# Patient Record
Sex: Male | Born: 1940
Health system: Southern US, Community
[De-identification: ages and names within clinical notes are randomized; demographics above are authoritative.]

## PROBLEM LIST (undated history)

## (undated) DIAGNOSIS — C4491 Basal cell carcinoma of skin, unspecified: Secondary | ICD-10-CM

## (undated) DIAGNOSIS — C61 Malignant neoplasm of prostate: Secondary | ICD-10-CM

## (undated) DIAGNOSIS — F40240 Claustrophobia: Secondary | ICD-10-CM

## (undated) DIAGNOSIS — F411 Generalized anxiety disorder: Secondary | ICD-10-CM

## (undated) DIAGNOSIS — D126 Benign neoplasm of colon, unspecified: Secondary | ICD-10-CM

## (undated) DIAGNOSIS — F419 Anxiety disorder, unspecified: Secondary | ICD-10-CM

## (undated) DIAGNOSIS — F514 Sleep terrors [night terrors]: Secondary | ICD-10-CM

## (undated) DIAGNOSIS — G4752 REM sleep behavior disorder: Secondary | ICD-10-CM

## (undated) DIAGNOSIS — F513 Sleepwalking [somnambulism]: Secondary | ICD-10-CM

## (undated) DIAGNOSIS — Z87828 Personal history of other (healed) physical injury and trauma: Secondary | ICD-10-CM

## (undated) DIAGNOSIS — E7800 Pure hypercholesterolemia, unspecified: Secondary | ICD-10-CM

## (undated) DIAGNOSIS — R413 Other amnesia: Secondary | ICD-10-CM

## (undated) HISTORY — DX: REM sleep behavior disorder: G47.52

## (undated) HISTORY — DX: Basal cell carcinoma of skin, unspecified: C44.91

## (undated) HISTORY — DX: Sleepwalking (somnambulism): F51.3

## (undated) HISTORY — PX: RHINOPLASTY: SUR1284

## (undated) HISTORY — DX: Sleep terrors (night terrors): F51.4

## (undated) HISTORY — PX: MOHS SURGERY: SHX181

## (undated) HISTORY — DX: Generalized anxiety disorder: F41.1

## (undated) HISTORY — DX: Benign neoplasm of colon, unspecified: D12.6

## (undated) HISTORY — PX: DENTAL SURGERY: SHX609

## (undated) HISTORY — PX: KNEE ARTHROSCOPY WITH MENISCAL REPAIR: SHX5653

## (undated) HISTORY — PX: APPENDECTOMY: SHX54

## (undated) HISTORY — DX: Personal history of other (healed) physical injury and trauma: Z87.828

## (undated) HISTORY — DX: Other amnesia: R41.3

## (undated) HISTORY — PX: TONSILLECTOMY AND ADENOIDECTOMY: SHX28

---

## 2008-01-17 ENCOUNTER — Encounter: Admission: RE | Admit: 2008-01-17 | Discharge: 2008-01-17 | Payer: Self-pay | Admitting: Family Medicine

## 2008-02-01 ENCOUNTER — Encounter: Admission: RE | Admit: 2008-02-01 | Discharge: 2008-02-01 | Payer: Self-pay | Admitting: Family Medicine

## 2009-07-08 DIAGNOSIS — F40240 Claustrophobia: Secondary | ICD-10-CM | POA: Insufficient documentation

## 2010-08-01 DIAGNOSIS — M179 Osteoarthritis of knee, unspecified: Secondary | ICD-10-CM

## 2010-08-01 DIAGNOSIS — M1710 Unilateral primary osteoarthritis, unspecified knee: Secondary | ICD-10-CM | POA: Insufficient documentation

## 2010-08-01 HISTORY — DX: Osteoarthritis of knee, unspecified: M17.9

## 2010-08-01 HISTORY — DX: Unilateral primary osteoarthritis, unspecified knee: M17.10

## 2011-08-27 DIAGNOSIS — J309 Allergic rhinitis, unspecified: Secondary | ICD-10-CM

## 2011-08-27 HISTORY — DX: Allergic rhinitis, unspecified: J30.9

## 2011-10-01 DIAGNOSIS — N400 Enlarged prostate without lower urinary tract symptoms: Secondary | ICD-10-CM

## 2011-10-01 DIAGNOSIS — N529 Male erectile dysfunction, unspecified: Secondary | ICD-10-CM | POA: Insufficient documentation

## 2011-10-01 HISTORY — DX: Male erectile dysfunction, unspecified: N52.9

## 2011-10-01 HISTORY — DX: Benign prostatic hyperplasia without lower urinary tract symptoms: N40.0

## 2011-11-12 DIAGNOSIS — N281 Cyst of kidney, acquired: Secondary | ICD-10-CM

## 2011-11-12 HISTORY — DX: Cyst of kidney, acquired: N28.1

## 2014-09-27 DIAGNOSIS — E782 Mixed hyperlipidemia: Secondary | ICD-10-CM | POA: Insufficient documentation

## 2014-09-27 HISTORY — DX: Mixed hyperlipidemia: E78.2

## 2014-12-06 ENCOUNTER — Telehealth: Payer: Self-pay

## 2014-12-06 NOTE — Telephone Encounter (Signed)
Rec'd records from Francoise Ceo MD.,Forwarding 11 page's to [facility]

## 2014-12-11 ENCOUNTER — Telehealth (HOSPITAL_COMMUNITY): Payer: Self-pay | Admitting: *Deleted

## 2014-12-11 NOTE — Telephone Encounter (Signed)
Left message on voicemail in reference to upcoming appointment scheduled for 12/13/14. Phone number given for a call back so details instructions can be given. Caleb Gibson W    

## 2014-12-11 NOTE — Telephone Encounter (Signed)
Patient given detailed instructions per Stress Test Requisition Sheet for test on 12/13/14 at 1500.Patient Notified to arrive 30 minutes early, and that it is imperative to arrive on time for appointment to keep from having the test rescheduled.  Patient verbalized understanding. Caleb Gibson, [name]

## 2014-12-12 ENCOUNTER — Telehealth: Payer: Self-pay

## 2014-12-12 NOTE — Telephone Encounter (Signed)
SPOKE WITH REBECCA (PRECERT/REFERALS) CASE IS PENDING FOR ECHO-[facility]- REBECCA STATED THAT IF SHE HAS NOT GOTTEN AN APPROVAL CODE BY 2:00 TODAY SHE WILL HAVE TO [place]. PATIENT . I WILL FOLLOW UP WITH HER AT 2:00 TODAY

## 2014-12-13 ENCOUNTER — Other Ambulatory Visit (HOSPITAL_COMMUNITY): Payer: Self-pay

## 2014-12-17 ENCOUNTER — Telehealth: Payer: Self-pay

## 2014-12-17 NOTE — Telephone Encounter (Signed)
Echo appt cancelled 12/13/2014 daj

## 2014-12-27 ENCOUNTER — Telehealth (HOSPITAL_COMMUNITY): Payer: Self-pay

## 2014-12-27 NOTE — Telephone Encounter (Signed)
Patient given detailed instructions per Stress Test Requisition Sheet for test on 01-01-2015 at 1500.Patient Notified to arrive 30 minutes early, and that it is imperative to arrive on time for appointment to keep from having the test rescheduled.  Patient verbalized understanding. Caleb Gibson, [name]

## 2014-12-31 ENCOUNTER — Telehealth: Payer: Self-pay

## 2014-12-31 NOTE — Telephone Encounter (Signed)
Left message for Caleb Gibson [phone]  to see if auth# was obtained for patient for upcoming echo on 01/01/2015

## 2015-01-01 ENCOUNTER — Ambulatory Visit (HOSPITAL_COMMUNITY): Payer: Medicare HMO

## 2015-01-01 ENCOUNTER — Other Ambulatory Visit (HOSPITAL_COMMUNITY): Payer: Self-pay | Admitting: Family Medicine

## 2015-01-01 ENCOUNTER — Ambulatory Visit (HOSPITAL_COMMUNITY): Payer: Medicare HMO | Attending: Cardiovascular Disease

## 2015-01-01 DIAGNOSIS — R5383 Other fatigue: Secondary | ICD-10-CM | POA: Insufficient documentation

## 2015-01-01 DIAGNOSIS — I4510 Unspecified right bundle-branch block: Secondary | ICD-10-CM | POA: Insufficient documentation

## 2015-01-01 DIAGNOSIS — E785 Hyperlipidemia, unspecified: Secondary | ICD-10-CM | POA: Diagnosis not present

## 2015-02-18 ENCOUNTER — Ambulatory Visit (INDEPENDENT_AMBULATORY_CARE_PROVIDER_SITE_OTHER): Payer: Medicare HMO | Admitting: Licensed Clinical Social Worker

## 2015-02-18 DIAGNOSIS — F40248 Other situational type phobia: Secondary | ICD-10-CM | POA: Diagnosis not present

## 2015-03-11 ENCOUNTER — Ambulatory Visit (INDEPENDENT_AMBULATORY_CARE_PROVIDER_SITE_OTHER): Payer: Medicare HMO | Admitting: Licensed Clinical Social Worker

## 2015-03-11 DIAGNOSIS — F40248 Other situational type phobia: Secondary | ICD-10-CM | POA: Diagnosis not present

## 2015-04-01 ENCOUNTER — Ambulatory Visit: Payer: Medicare HMO | Admitting: Licensed Clinical Social Worker

## 2015-05-29 ENCOUNTER — Other Ambulatory Visit: Payer: Self-pay | Admitting: Orthopedic Surgery

## 2015-05-29 DIAGNOSIS — M25512 Pain in left shoulder: Secondary | ICD-10-CM

## 2015-06-08 ENCOUNTER — Ambulatory Visit
Admission: RE | Admit: 2015-06-08 | Discharge: 2015-06-08 | Disposition: A | Discharge status: Home / Self Care | Payer: Medicare HMO | Source: Ambulatory Visit | Attending: Orthopedic Surgery | Admitting: Orthopedic Surgery

## 2015-06-08 DIAGNOSIS — M25512 Pain in left shoulder: Secondary | ICD-10-CM

## 2015-10-08 DIAGNOSIS — Z79899 Other long term (current) drug therapy: Secondary | ICD-10-CM | POA: Insufficient documentation

## 2016-01-07 DIAGNOSIS — I950 Idiopathic hypotension: Secondary | ICD-10-CM

## 2016-01-07 HISTORY — DX: Idiopathic hypotension: I95.0

## 2016-03-27 ENCOUNTER — Encounter (HOSPITAL_COMMUNITY): Payer: Self-pay | Admitting: *Deleted

## 2016-03-27 ENCOUNTER — Emergency Department (HOSPITAL_COMMUNITY): Payer: Medicare HMO

## 2016-03-27 ENCOUNTER — Emergency Department (HOSPITAL_COMMUNITY)
Admission: EM | Admit: 2016-03-27 | Discharge: 2016-03-27 | Disposition: A | Discharge status: Home / Self Care | Payer: Medicare HMO | Attending: Emergency Medicine | Admitting: Emergency Medicine

## 2016-03-27 DIAGNOSIS — R079 Chest pain, unspecified: Secondary | ICD-10-CM | POA: Diagnosis not present

## 2016-03-27 HISTORY — DX: Claustrophobia: F40.240

## 2016-03-27 HISTORY — DX: Pure hypercholesterolemia, unspecified: E78.00

## 2016-03-27 HISTORY — DX: Anxiety disorder, unspecified: F41.9

## 2016-03-27 LAB — BASIC METABOLIC PANEL
Anion gap: 6 (ref 5–15)
BUN: 14 mg/dL (ref 6–20)
CALCIUM: 9.8 mg/dL (ref 8.9–10.3)
CHLORIDE: 106 mmol/L (ref 101–111)
CO2: 26 mmol/L (ref 22–32)
CREATININE: 0.91 mg/dL (ref 0.61–1.24)
GFR calc Af Amer: >60 mL/min (ref >60)
GFR calc non Af Amer: >60 mL/min (ref >60)
GLUCOSE: 99 mg/dL (ref 65–99)
Potassium: 4.7 mmol/L (ref 3.5–5.1)
Sodium: 138 mmol/L (ref 135–145)

## 2016-03-27 LAB — CBC
HCT: 40.9 % (ref 39.0–52.0)
Hemoglobin: 14.1 g/dL (ref 13.0–17.0)
MCH: 31.6 pg (ref 26.0–34.0)
MCHC: 34.5 g/dL (ref 30.0–36.0)
MCV: 91.7 fL (ref 78.0–100.0)
PLATELETS: 186 10*3/uL (ref 150–400)
RBC: 4.46 MIL/uL (ref 4.22–5.81)
RDW: 12.7 % (ref 11.5–15.5)
WBC: 4.4 10*3/uL (ref 4.0–10.5)

## 2016-03-27 LAB — I-STAT TROPONIN, ED: Troponin i, poc: 0 ng/mL (ref 0.00–0.08)

## 2016-03-27 NOTE — ED Provider Notes (Signed)
[facility] DEPT Provider Note   CSN: [id number] Arrival date & time: 03/27/16  1251     History   Chief Complaint Chief Complaint  Patient presents with  . Chest Pain    HPI Dickson Barfield is a 75 y.o. male.  HPI Patient presents with sharp right sided chest pain. It occurred 3 times today. The longest episode lasted 2 seconds. No shortness of breath. No lightheadedness or dizziness. No fevers. No cough. He has not had pains like this before. No swelling in his legs. No change in his exercise tolerance. negatvie stress test in the last 2 years.no abdominal pain.    Past Medical History:  Diagnosis Date  . Anxiety   . Claustrophobia   . High cholesterol     There are no active problems to display for this patient.   History reviewed. No pertinent surgical history.     Home Medications    Prior to Admission medications   Not on File    Family History History reviewed. No pertinent family history.  Social History Social History  Substance Use Topics  . Smoking status: Never Smoker  . Smokeless tobacco: Not on file  . Alcohol use Yes     Comment: occ     Allergies   Review of patient's allergies indicates no known allergies.   Review of Systems Review of Systems  Constitutional: Negative for appetite change, chills and fever.  HENT: Negative for congestion.   Respiratory: Negative for chest tightness.   Cardiovascular: Positive for chest pain.  Gastrointestinal: Negative for abdominal pain.  Genitourinary: Negative for difficulty urinating and flank pain.  Musculoskeletal: Negative for back pain.  Skin: Negative for rash.  Neurological: Negative for headaches.  Psychiatric/Behavioral: Negative for agitation.     Physical Exam Updated Vital Signs BP 114/63 (BP Location: Right Arm)   Pulse 65   Temp 98.7 F (37.1 C) (Oral)   Resp 14   Wt 217 lb (98.4 kg)   SpO2 98%   Physical Exam  Constitutional: He appears well-developed.  HENT:    Head: Atraumatic.  Eyes: EOM are normal.  Neck: Neck supple.  Cardiovascular: Normal rate.   Pulmonary/Chest: Effort normal.  Abdominal: Soft. There is no tenderness.  Musculoskeletal: Normal range of motion.  Neurological: He is alert.  Skin: Skin is warm. Capillary refill takes less than 2 seconds.  Psychiatric: He has a normal mood and affect.     ED Treatments / Results  Labs (all labs ordered are listed, but only abnormal results are displayed) Labs Reviewed  [place], ED    EKG  EKG Interpretation  Date/Time:  Friday March 27 2016 12:59:15 EDT Ventricular Rate:  66 PR Interval:  188 QRS Duration: 108 QT Interval:  408 QTC Calculation: 427 R Axis:   50 Text Interpretation:  Normal sinus rhythm Normal ECG Confirmed by Alvino Chapel  MD, Mahamed Zalewski [phone]) on 03/27/2016 3:42:41 PM       Radiology Dg Chest 2 View  Result Date: 03/27/2016 CLINICAL DATA:  Chest pain. EXAM: CHEST  2 VIEW COMPARISON:  No recent prior. FINDINGS: Mediastinum and hilar structures are normal. Lungs are clear. No pleural effusion or pneumothorax. Degenerative changes thoracic spine. IMPRESSION: No acute cardiopulmonary disease. Electronically Signed   By: Marcello Moores  Register   On: 03/27/2016 14:30    Procedures Procedures (including critical care time)  Medications Ordered in ED Medications - No data to display   Initial Impression / Assessment and Plan /  ED Course  I have reviewed the triage vital signs and the nursing notes.  Pertinent labs & imaging results that were available during my care of the patient were reviewed by me and considered in my medical decision making (see chart for details).  Clinical Course    Patient with chest pain. Sharp and lasted 1 second. No rash. Reassuring story and exam. Low risk overall. D/c.  Final Clinical Impressions(s) / ED Diagnoses   Final diagnoses:  Nonspecific chest pain    New Prescriptions New  Prescriptions   No medications on file     Davonna Belling, MD 03/27/16 1612

## 2016-03-27 NOTE — ED Triage Notes (Signed)
Pt reports intermittent "shocking" right side chest pains today. Denies sob. ekg done. No acute resp distress noted at triage.

## 2016-09-01 LAB — HM COLONOSCOPY

## 2017-05-20 ENCOUNTER — Encounter: Payer: Self-pay | Admitting: Family Medicine

## 2017-06-14 ENCOUNTER — Encounter: Payer: Self-pay | Admitting: *Deleted

## 2017-06-15 ENCOUNTER — Ambulatory Visit (INDEPENDENT_AMBULATORY_CARE_PROVIDER_SITE_OTHER): Payer: Medicare HMO | Admitting: Family Medicine

## 2017-06-15 ENCOUNTER — Encounter: Payer: Self-pay | Admitting: Family Medicine

## 2017-06-15 ENCOUNTER — Other Ambulatory Visit: Payer: Self-pay | Admitting: *Deleted

## 2017-06-15 VITALS — BP 110/70 | HR 68 | Temp 98.4°F | Ht 69.0 in | Wt 229.5 lb

## 2017-06-15 DIAGNOSIS — R1012 Left upper quadrant pain: Secondary | ICD-10-CM | POA: Diagnosis not present

## 2017-06-15 DIAGNOSIS — Z79899 Other long term (current) drug therapy: Secondary | ICD-10-CM

## 2017-06-15 DIAGNOSIS — D126 Benign neoplasm of colon, unspecified: Secondary | ICD-10-CM

## 2017-06-15 DIAGNOSIS — N529 Male erectile dysfunction, unspecified: Secondary | ICD-10-CM

## 2017-06-15 DIAGNOSIS — N400 Enlarged prostate without lower urinary tract symptoms: Secondary | ICD-10-CM | POA: Diagnosis not present

## 2017-06-15 DIAGNOSIS — I950 Idiopathic hypotension: Secondary | ICD-10-CM

## 2017-06-15 DIAGNOSIS — G8929 Other chronic pain: Secondary | ICD-10-CM | POA: Diagnosis not present

## 2017-06-15 DIAGNOSIS — E782 Mixed hyperlipidemia: Secondary | ICD-10-CM | POA: Diagnosis not present

## 2017-06-15 DIAGNOSIS — C4491 Basal cell carcinoma of skin, unspecified: Secondary | ICD-10-CM | POA: Insufficient documentation

## 2017-06-15 DIAGNOSIS — Z0000 Encounter for general adult medical examination without abnormal findings: Secondary | ICD-10-CM

## 2017-06-15 DIAGNOSIS — F40240 Claustrophobia: Secondary | ICD-10-CM

## 2017-06-15 HISTORY — DX: Benign neoplasm of colon, unspecified: D12.6

## 2017-06-15 LAB — CBC WITH DIFFERENTIAL/PLATELET
BASOS ABS: 0 10*3/uL (ref 0.0–0.1)
Basophils Relative: 0.9 % (ref 0.0–3.0)
EOS ABS: 0.1 10*3/uL (ref 0.0–0.7)
EOS PCT: 2.1 % (ref 0.0–5.0)
HCT: 42.6 % (ref 39.0–52.0)
Hemoglobin: 14.4 g/dL (ref 13.0–17.0)
Lymphocytes Relative: 19.8 % (ref 12.0–46.0)
Lymphs Abs: 0.8 10*3/uL (ref 0.7–4.0)
MCHC: 33.8 g/dL (ref 30.0–36.0)
MCV: 95.4 fl (ref 78.0–100.0)
MONO ABS: 0.4 10*3/uL (ref 0.1–1.0)
Monocytes Relative: 9.8 % (ref 3.0–12.0)
NEUTROS PCT: 67.4 % (ref 43.0–77.0)
Neutro Abs: 2.7 10*3/uL (ref 1.4–7.7)
Platelets: 214 10*3/uL (ref 150.0–400.0)
RBC: 4.46 Mil/uL (ref 4.22–5.81)
RDW: 13.2 % (ref 11.5–15.5)
WBC: 4 10*3/uL (ref 4.0–10.5)

## 2017-06-15 LAB — LIPASE: Lipase: 24 U/L (ref 11.0–59.0)

## 2017-06-15 LAB — PSA, MEDICARE: PSA: 5.54 ng/mL — AB (ref 0.10–4.00)

## 2017-06-15 LAB — COMPREHENSIVE METABOLIC PANEL
ALBUMIN: 4.7 g/dL (ref 3.5–5.2)
ALT: 14 U/L (ref 0–53)
AST: 13 U/L (ref 0–37)
Alkaline Phosphatase: 57 U/L (ref 39–117)
BUN: 19 mg/dL (ref 6–23)
CO2: 27 mEq/L (ref 19–32)
CREATININE: 0.89 mg/dL (ref 0.40–1.50)
Calcium: 9.2 mg/dL (ref 8.4–10.5)
Chloride: 104 mEq/L (ref 96–112)
GFR: 88.06 mL/min (ref >60.00)
GLUCOSE: 101 mg/dL — AB (ref 70–99)
POTASSIUM: 3.9 meq/L (ref 3.5–5.1)
SODIUM: 138 meq/L (ref 135–145)
TOTAL PROTEIN: 6.5 g/dL (ref 6.0–8.3)
Total Bilirubin: 0.8 mg/dL (ref 0.2–1.2)

## 2017-06-15 LAB — LIPID PANEL
CHOLESTEROL: 161 mg/dL (ref 0–200)
HDL: 72.7 mg/dL (ref >39.00)
LDL Cholesterol: 74 mg/dL (ref 0–99)
NONHDL: 88.48
Total CHOL/HDL Ratio: 2
Triglycerides: 70 mg/dL (ref 0.0–149.0)
VLDL: 14 mg/dL (ref 0.0–40.0)

## 2017-06-15 LAB — TSH: TSH: 1.82 u[IU]/mL (ref 0.35–4.50)

## 2017-06-15 MED ORDER — CITALOPRAM HYDROBROMIDE 10 MG PO TABS: 10.0000 mg | ORAL_TABLET | Freq: Every day | ORAL | 3 refills | Status: DC

## 2017-06-15 MED ORDER — SILDENAFIL CITRATE 100 MG PO TABS: 100.0000 mg | ORAL_TABLET | Freq: Every day | ORAL | 11 refills | Status: DC | PRN

## 2017-06-15 MED ORDER — LORAZEPAM 0.5 MG PO TABS: 0.5000 mg | ORAL_TABLET | Freq: Every day | ORAL | 0 refills | Status: DC | PRN

## 2017-06-15 NOTE — Progress Notes (Signed)
I have reviewed results. Normal. Patient notified by letter. Please see letter for details. 

## 2017-06-15 NOTE — Patient Instructions (Signed)
It was so good seeing you again! Thank you for establishing with my new practice and allowing me to continue caring for you. It means a lot to me.   Please schedule a follow up appointment with me in 6 months for recheck. Sooner if mood or anxiety check is needed.  Please do these things to maintain good health!   Exercise at least 30-45 minutes a day,  4-5 days a week.   Eat a low-fat diet with lots of fruits and vegetables, up to 7-9 servings per day.  Drink plenty of water daily. Try to drink 8 8oz glasses per day.  Seatbelts can save your life. Always wear your seatbelt.  Place Smoke Detectors on every level of your home and check batteries every year.  Eye Doctor - have an eye exam every 1-2 years  Safe sex - use condoms to protect yourself from STDs if you could be exposed to these types of infections.  Avoid heavy alcohol use. If you drink, keep it to less than 2 drinks/day and not every day.  [place].  Choose someone you trust that could speak for you if you became unable to speak for yourself.  Depression is common in our stressful world.If you're feeling down or losing interest in things you normally enjoy, please come in for a visit.

## 2017-06-15 NOTE — Progress Notes (Signed)
Subjective  Chief Complaint  Patient presents with  . Hyperlipidemia    HPI: Caleb Gibson is a 77 y.o. male who presents to [facility] today for a Male Wellness Visit. He also has the concerns and/or needs as listed above in the chief complaint and other complaints listed below. These will be addressed in addition to the Health Maintenance Visit.   Wellness Visit: annual visit with health maintenance review and exam    Overall, continues to do well overall.  Continues daily walking and healthy diet.  Weight is stable.  Colorectal cancer screening is up-to-date.  Immunizations are up-to-date Lifestyle: Body mass index is 33.89 kg/m. Wt Readings from Last 3 Encounters:  06/15/17 229 lb 8 oz (104.1 kg)  03/27/16 217 lb (98.4 kg)   Diet: low fat Exercise: daily, walking and [facility]  Chronic disease management visit and/or acute problem visit:  Claustrophobia-continues to be symptomatic.  Has been on Celexa for many years but uncertain if this is helping him.  Had been using Xanax 0.5 mg for flying or car travel, however his last plane trip to [place] required 1 mg of Xanax and he had adverse effects including hypersomnolence and abnormal sleep-related behaviors such as reaching out and grabbing the lady in front of him on the plane.  Due to these adverse effects he is inquiring about other options.  He has not been on other benzodiazepines in the past.  Irritability-reports that he has had long-standing issues with occasionally feeling negative or irritable and he typically manages this behaviorally.  He was placed on the Celexa for claustrophobia and for these describes symptoms years ago.  But recently he reports increased appliances and getting upset with his wife.  She tends to be a bit controlling.  He tends to keep his feelings to himself and tries to block them out.  He denies symptoms of depression.  No chronic anxiety.  He likes to avoid  confrontation.  Hyperlipidemia stable on statin therapy.  Due for recheck  BPH without symptoms-due for recheck.  Idiopathic hypotension: Blood pressures have been stable although he will occasionally find a reading 80s over 50s.  He denies symptoms.  Review of systems is negative for palpitations chest pains lightheadedness  Erectile dysfunction-requesting refill of Viagra.  Works well.  No adverse effects.  Patient Active Problem List   Diagnosis Date Noted  . Tubular adenoma of colon 06/15/2017    Priority: High  . On statin therapy due to risk of future cardiovascular event 10/08/2015    Priority: High  . Mixed hyperlipidemia 09/27/2014    Priority: High  . Idiopathic hypotension 01/07/2016    Priority: Medium  . BPH (benign prostatic hyperplasia) 10/01/2011    Priority: Medium  . Erectile dysfunction 10/01/2011    Priority: Medium  . Osteoarthritis, knee 08/01/2010    Priority: Medium  . Claustrophobia 07/08/2009    Priority: Medium  . Basal cell carcinoma of skin 06/15/2017    Priority: Low  . Simple cyst of kidney 11/12/2011    Priority: Low  . Allergic rhinitis 08/27/2011    Priority: Low   Health Maintenance  Topic Date Due  . COLONOSCOPY  09/01/2021  . TETANUS/TDAP  09/30/2021  . INFLUENZA VACCINE  Completed  . PNA vac Low Risk Adult  Completed   Immunization History  Administered Date(s) Administered  . Influenza Split 03/14/2011  . Influenza, High Dose Seasonal PF 02/27/2012, 02/05/2014, 03/10/2015, 04/14/2016  . Influenza, Seasonal, Injecte, Preservative Fre 02/27/2013  .  Influenza-Unspecified 04/15/2017  . Pneumococcal Conjugate-13 02/05/2014  . Pneumococcal-Unspecified 07/08/2009  . Td 11/15/2007  . Tdap 10/01/2011  . Zoster 06/20/2010   We updated and reviewed the patient's past history in detail and it is documented below. Allergies: Patient has No Known Allergies. Past Medical History  has a past medical history of Anxiety, Basal cell  carcinoma of skin, Claustrophobia, High cholesterol, and Tubular adenoma of colon (06/15/2017). Past Surgical History Patient  has a past surgical history that includes Appendectomy; Dental surgery; Rhinoplasty; Tonsillectomy and adenoidectomy; Mohs surgery; and Knee arthroscopy with meniscal repair (Right). Social History Patient  reports that  has never smoked. he has never used smokeless tobacco. He reports that he drinks alcohol. He reports that he does not use drugs. Family History family history includes Alcohol abuse in his maternal aunt and mother; Cancer in his maternal grandfather; Cirrhosis in his maternal aunt; Diabetes in his father; Glaucoma in his father; Heart disease in his maternal grandmother; Melanoma in his mother; Prostate cancer in his father. Review of Systems: Constitutional: negative for fever or malaise Ophthalmic: negative for photophobia, double vision or loss of vision Cardiovascular: negative for chest pain, dyspnea on exertion, or new LE swelling Respiratory: negative for SOB or persistent cough Gastrointestinal: negative for abdominal pain, change in bowel habits or melena Genitourinary: negative for dysuria or gross hematuria Musculoskeletal: negative for new gait disturbance or muscular weakness Integumentary: negative for new or persistent rashes Neurological: negative for TIA or stroke symptoms Psychiatric: negative for SI or delusions Allergic/Immunologic: negative for hives  Patient Care Team    Relationship Specialty Notifications Start End  [name], MD PCP - General Family Medicine  06/15/17    Objective  Vitals: BP 110/70 (BP Location: Left Arm, Patient Position: Sitting, Cuff Size: Large)   Pulse 68   Temp 98.4 F (36.9 C) (Oral)   Ht 5\' 9"  (1.753 m)   Wt 229 lb 8 oz (104.1 kg)   SpO2 95%   BMI 33.89 kg/m  General:  Well developed, well nourished, no acute distress  Psych:  Alert and orientedx3,normal mood and affect, good  insight HEENT:  Normocephalic, atraumatic, non-icteric sclera, PERRL, oropharynx is clear without mass or exudate, supple neck without adenopathy, mass or thyromegaly Cardiovascular:  Normal S1, S2, RRR several ectopic beats present without gallop,  rub or murmur, nondisplaced PMI, +2 distal pulses in bilateral upper and lower extremities. Respiratory:  Good breath sounds bilaterally, CTAB with normal respiratory effort Gastrointestinal: normal bowel sounds, soft, non-tender, no noted masses. No HSM MSK: no deformities, contusions. Joints are without erythema or swelling. Spine and CVA region are nontender Skin:  Warm, no rashes or suspicious lesions noted Neurologic:    Mental status is normal. CN 2-11 are normal. Gross motor and sensory exams are normal. Stable gait. No tremor GU: No inguinal hernias or adenopathy are appreciated bilaterally   Assessment  1. Annual physical exam   2. Tubular adenoma of colon   3. Benign prostatic hyperplasia without lower urinary tract symptoms   4. Claustrophobia   5. Idiopathic hypotension   6. Mixed hyperlipidemia   7. On statin therapy due to risk of future cardiovascular event   8. Abdominal pain, chronic, left upper quadrant   9. Vasculogenic erectile dysfunction, unspecified vasculogenic erectile dysfunction type      Plan  Male Wellness Visit:  Age appropriate Health Maintenance and Prevention measures were discussed with patient. Included topics are cancer screening recommendations, ways to keep healthy (see AVS)  including dietary and exercise recommendations, regular eye and dental care, use of seat belts, and avoidance of moderate alcohol use and tobacco use.  Colorectal cancer screening with tubular adenoma for follow-up in 5 years.  BMI: discussed patient's BMI and encouraged positive lifestyle modifications to help get to or maintain a target BMI.  HM needs and immunizations were addressed and ordered. See below for orders. See HM and  immunization section for updates.  Routine labs and screening tests ordered including cmp, cbc and lipids where appropriate.  Discussed recommendations regarding Vit D and calcium supplementation (see AVS)  Chronic disease f/u and/or acute problem visit: (deemed necessary to be done in addition to the wellness visit):  BPH without symptoms-check PSA and follow-up with urology if worsening  Claustrophobia and negative mood with irritability-counseling done.  Discussed that keeping emotions inside can affect overall emotional well-being.  Recommend discussing with his wife is concerned.  Discussed ways to do this without bringing up confrontation.  He will follow-up with me if symptoms persist continue Celexa for now.  Change to Ativan for as needed use for claustrophobic attacks.  Hopefully will have less side effects.  Recheck lipids, fasting, on statin.  Check LFTs.  Reported history of left upper quadrant pain [facility] at [facility] as GERD.  On further review, symptoms most consistent with costochondritis.  Stop Protonix.  NSAIDs as needed.  Refilled medication for erectile dysfunction  Follow up: No Follow-up on file.   Commons side effects, risks, benefits, and alternatives for medications and treatment plan prescribed today were discussed, and the patient expressed understanding of the given instructions. Patient is instructed to call or message via MyChart if he/she has any questions or concerns regarding our treatment plan. No barriers to understanding were identified. We discussed Red Flag symptoms and signs in detail. Patient expressed understanding regarding what to do in case of urgent or emergency type symptoms.   Medication list was reconciled, printed and provided to the patient in AVS. Patient instructions and summary information was reviewed with the patient as documented in the AVS. This note was prepared with assistance of Dragon voice recognition software.  Occasional wrong-word or sound-a-like substitutions may have occurred due to the inherent limitations of voice recognition software  Orders Placed This Encounter  Procedures  . CBC with Differential/Platelet  . Comprehensive metabolic panel  . Lipid panel  . TSH  . PSA, Medicare  . Lipase   Meds ordered this encounter  Medications  . citalopram (CELEXA) 10 MG tablet    Sig: Take 1 tablet (10 mg total) by mouth daily.    Dispense:  90 tablet    Refill:  3  . sildenafil (VIAGRA) 100 MG tablet    Sig: Take 1 tablet (100 mg total) by mouth daily as needed for erectile dysfunction.    Dispense:  10 tablet    Refill:  11  . LORazepam (ATIVAN) 0.5 MG tablet    Sig: Take 1 tablet (0.5 mg total) by mouth daily as needed (take 15-30 minutes prior to flying).    Dispense:  15 tablet    Refill:  0

## 2017-06-15 NOTE — Progress Notes (Signed)
Lab results mailed to patient in letter. Normal results. No action / follow up needed on these results.

## 2017-06-28 ENCOUNTER — Telehealth: Payer: Self-pay | Admitting: Family Medicine

## 2017-06-28 NOTE — Telephone Encounter (Signed)
Error

## 2017-06-28 NOTE — Telephone Encounter (Signed)
Pt sent note: see his fax. Please clarify with him that the lorazepam was the NEW medication to be used in place of the alprazolam for panic attacks. I want to see if he has less side effects on it.   Regarding the citalopram, we can consider changing to a different medication but I'd want him to schedule an appointment so we can discuss how to do this.   Thanks.

## 2017-06-28 NOTE — Telephone Encounter (Signed)
Pt called back, told him that he should be taking Lorazepam the new medication Dr. Jonni Sanger prescribed instead of Lorazepam. Pt said he is taking Citalopram and can not take Lorazepam. Told pt he needs to make an appointment to discuss medications per Dr. Jonni Sanger. Pt verbalized understanding.

## 2017-06-28 NOTE — Telephone Encounter (Signed)
Left message on voicemail to call office.  

## 2017-06-28 NOTE — Telephone Encounter (Signed)
Wife calling b/c husband missed call from [facility] RE medication changes. Please call back.

## 2017-07-08 ENCOUNTER — Other Ambulatory Visit: Payer: Self-pay

## 2017-07-08 ENCOUNTER — Ambulatory Visit (INDEPENDENT_AMBULATORY_CARE_PROVIDER_SITE_OTHER): Payer: Medicare HMO | Admitting: Family Medicine

## 2017-07-08 ENCOUNTER — Encounter: Payer: Self-pay | Admitting: Family Medicine

## 2017-07-08 ENCOUNTER — Ambulatory Visit: Payer: Self-pay | Admitting: Family Medicine

## 2017-07-08 VITALS — BP 118/80 | HR 72 | Temp 98.2°F | Ht 69.0 in | Wt 231.6 lb

## 2017-07-08 DIAGNOSIS — Z8042 Family history of malignant neoplasm of prostate: Secondary | ICD-10-CM

## 2017-07-08 DIAGNOSIS — N400 Enlarged prostate without lower urinary tract symptoms: Secondary | ICD-10-CM | POA: Diagnosis not present

## 2017-07-08 DIAGNOSIS — F40240 Claustrophobia: Secondary | ICD-10-CM

## 2017-07-08 DIAGNOSIS — R972 Elevated prostate specific antigen [PSA]: Secondary | ICD-10-CM | POA: Diagnosis not present

## 2017-07-08 NOTE — Progress Notes (Signed)
Subjective  CC:  Chief Complaint  Patient presents with  . Anxiety    HPI: Caleb Gibson is a 77 y.o. male who presents to the office today to address the problems listed above in the chief complaint. And to review elevated PSA.  Pt has long-standing history of claustrophobia.  Worse in enclosed spaces like the backseat of a car or during flights.  Here to review his medications.  Mildly worsening symptoms in spite of Xanax use for panic anxiety symptoms.  Typically uses Xanax 0.5 mg as needed, however recently he is needed to go up to 1 mg to effectively treat his symptoms.  However, he is experiencing side effects of lethargy and confusion.  He reports one episode of reaching out to another passenger in front of him on a plane which caused problems due to the medication.  He is also on daily citalopram to help manage his phobia.  This was started over a decade ago.  He thinks it did help curb the symptoms.  He has had no adverse effects.  Prior to that he was on Lexapro.  He has never been on Paxil.  He denies symptoms of chronic anxiety, general anxiety disorder with a negative gad 7 today.  No depressive symptoms.  His wife does bring up some mild concerns about his short-term memory.   Follow-up for elevated PSA.  Recently reviewed notes from prior urology appointments not yet scanned in.  He has history of benign prostatic hypertrophy without obstructive symptoms.  Recently, PSA was in the high threes.  Our last test was at 5.4.   His father died from prostatic cancer.  He has followed up annually with urology in the past but his urologist recently retired.  He has no urinary symptoms now.                                                              I reviewed the patients updated PMH, FH, and SocHx.    Patient Active Problem List   Diagnosis Date Noted  . Tubular adenoma of colon 06/15/2017    Priority: High  . On statin therapy due to risk of future cardiovascular event 10/08/2015   Priority: High  . Mixed hyperlipidemia 09/27/2014    Priority: High  . Idiopathic hypotension 01/07/2016    Priority: Medium  . BPH (benign prostatic hyperplasia) 10/01/2011    Priority: Medium  . Erectile dysfunction 10/01/2011    Priority: Medium  . Osteoarthritis, knee 08/01/2010    Priority: Medium  . Claustrophobia 07/08/2009    Priority: Medium  . Basal cell carcinoma of skin 06/15/2017    Priority: Low  . Simple cyst of kidney 11/12/2011    Priority: Low  . Allergic rhinitis 08/27/2011    Priority: Low   Current Meds  Medication Sig  . Lycopene 10 MG CAPS Take 1 capsule by mouth daily.     Allergies: Patient has No Known Allergies. Family History: Patient family history includes Alcohol abuse in his maternal aunt and mother; Cancer in his maternal grandfather; Cirrhosis in his maternal aunt; Diabetes in his father; Glaucoma in his father; Heart disease in his maternal grandmother; Melanoma in his mother; Prostate cancer in his father. Social History:  Patient  reports that  has never smoked. he  has never used smokeless tobacco. He reports that he drinks alcohol. He reports that he does not use drugs.  Review of Systems: Constitutional: Negative for fever malaise or anorexia Cardiovascular: negative for chest pain Respiratory: negative for SOB or persistent cough Gastrointestinal: negative for abdominal pain Lab Results  Component Value Date   PSA 5.54 (H) 06/15/2017    Objective  Vitals: BP 118/80   Pulse 72   Temp 98.2 F (36.8 C)   Ht 5\' 9"  (1.753 m)   Wt 231 lb 9.6 oz (105.1 kg)   BMI 34.20 kg/m  General: no acute distress , A&Ox3 Psych: nl eye contact, speech. Nl affect and mood  Assessment  1. Claustrophobia   2. Benign prostatic hyperplasia without lower urinary tract symptoms   3. Elevated PSA   4. Family history of prostate cancer in father      Plan   Claustrophobia: Counseling done.  This office visit was 30 minutes in length and time  was spent for greater than 50% of this visit in face-to-face counseling.  We discussed diagnosis of claustrophobia and different treatment options including cognitive behavioral therapy, benzodiazepines and the use of SSRIs.  For now, will replace Xanax with Ativan to see if he has less side effects and better efficacy.  Continue Celexa.  Consider CBT.  If not improving, consider changing from Celexa to Paxil as there are some studies of benefit.  Elevated PSA: We will refer to urology for further evaluation and treatment.  Counseled that this is not likely a significant problem.  Will defer to urology for recommendations regarding further evaluation or not.  Follow up: 12/14/2017    Commons side effects, risks, benefits, and alternatives for medications and treatment plan prescribed today were discussed, and the patient expressed understanding of the given instructions. Patient is instructed to call or message via MyChart if he/she has any questions or concerns regarding our treatment plan. No barriers to understanding were identified. We discussed Red Flag symptoms and signs in detail. Patient expressed understanding regarding what to do in case of urgent or emergency type symptoms.   Medication list was reconciled, printed and provided to the patient in AVS. Patient instructions and summary information was reviewed with the patient as documented in the AVS. This note was prepared with assistance of Dragon voice recognition software. Occasional wrong-word or sound-a-like substitutions may have occurred due to the inherent limitations of voice recognition software  Orders Placed This Encounter  Procedures  . Ambulatory referral to Urology   No orders of the defined types were placed in this encounter.

## 2017-07-08 NOTE — Patient Instructions (Addendum)
Please follow up if symptoms do not improve or as needed.   Stop the alprazolam (xanax) Replace with lorazapam (Ativan) for panic symptoms as needed.  Continue your daily citalopram.   We will call you with information regarding your referral appointment. Urology - Dr. Karsten Ro

## 2017-09-02 LAB — PSA: PSA: 5.6

## 2017-09-03 ENCOUNTER — Ambulatory Visit (INDEPENDENT_AMBULATORY_CARE_PROVIDER_SITE_OTHER): Payer: Medicare HMO | Admitting: Family Medicine

## 2017-09-03 ENCOUNTER — Other Ambulatory Visit: Payer: Self-pay

## 2017-09-03 ENCOUNTER — Encounter: Payer: Self-pay | Admitting: Family Medicine

## 2017-09-03 VITALS — BP 110/76 | HR 75 | Temp 98.1°F | Resp 15 | Ht 69.0 in | Wt 233.4 lb

## 2017-09-03 DIAGNOSIS — F40240 Claustrophobia: Secondary | ICD-10-CM | POA: Diagnosis not present

## 2017-09-03 DIAGNOSIS — G3184 Mild cognitive impairment, so stated: Secondary | ICD-10-CM

## 2017-09-03 DIAGNOSIS — R413 Other amnesia: Secondary | ICD-10-CM

## 2017-09-03 LAB — VITAMIN B12: Vitamin B-12: 814 pg/mL (ref 211–911)

## 2017-09-03 MED ORDER — DONEPEZIL HCL 10 MG PO TABS: 10.0000 mg | ORAL_TABLET | Freq: Every day | ORAL | 5 refills | Status: DC

## 2017-09-03 NOTE — Patient Instructions (Addendum)
Medicare recommends an Annual Wellness Visit for all patients. Please schedule this to be done with our Nurse Educator, Maudie Mercury. This is an informative "talk" visit; it's goals are to ensure that your health care needs are being met and to give you education regarding avoiding falls, ensuring you are not suffering from depression or problems with memory or thinking, and to educate you on Advance Care Planning. It helps me take good care of you!  Follow up with me in 3 months to recheck memory and medication.   We will call you with information regarding your referral appointment. Brain imaging test: CT scan    Mild Neurocognitive Disorder Mild neurocognitive disorder (formerly known as mild cognitive impairment) is a mental disorder. It is a slight abnormal decrease in mental function. The areas of mental function affected may include memory, thought, communication, behavior, and completion of tasks. The decrease is noticeable and measurable but for the most part does not interfere with your daily activities. Mild neurocognitive disorder typically occurs in people older than 60 years but can occur earlier. It is not as serious as major neurocognitive disorder (formerly known as dementia) but may lead to a more serious neurocognitive disorder. However, in some cases the condition does not get worse. A few people with this disorder even improve. What are the causes? There are a number of different causes of mild neurocognitive disorder:  Brain disorders associated with abnormal protein deposits, such as Alzheimer's disease, Pick's disease, and Lewy body disease.  Brain disorders associated with abnormal movement, such as Parkinson's disease and Huntington's disease.  Diseases affecting blood vessels in the brain and resulting in mini-strokes.  Certain infections, such as human immunodeficiency virus (HIV) infection.  Traumatic brain injury.  Other medical conditions such as brain tumors, underactive  thyroid (hypothyroidism), and vitamin B12 deficiency.  Use of certain prescription medicine and "recreational" drugs.  What are the signs or symptoms? Symptoms of mild neurocognitive disorder include:  Difficulty remembering. You may forget details of recent events, names, or phone numbers. You may forget important social events and appointments or repeatedly forget where you put your car keys.  Difficulty thinking and solving problems. You may have trouble with complex tasks such as paying bills or driving in unfamiliar locations.  Difficulty communicating. You may have trouble finding the right word, naming an object, forming a sentence that makes sense, or understanding what you read or hear.  Changes in your behavior or personality. You may lose interest in the things that you used to enjoy or withdraw from social situations. You may get angry more easily than usual. You may act before thinking. You may do things in public that you would not usually do. You may hear or see things that are not real (hallucinations). You may believe falsely that others are trying to hurt you (paranoia).  How is this diagnosed? Mild neurocognitive disorder is diagnosed through an assessment by your health care provider. Your health care provider will ask you and your family, friends, or coworkers questions about your symptoms. He or she will ask how often the symptoms occur, how long they have been occurring, whether they are getting worse, and the effect they are having on your life. Your health care provider may refer you to a neurologist or mental health specialist for a detailed evaluation of your mental functions (neuropsychological testing). To identify the cause of your mild neurocognitive disorder, your health care provider may:  Obtain a detailed medical history.  Ask about alcohol and  drug use, including prescription medicine.  Perform a physical exam.  Order blood tests and brain imaging  exams.  How is this treated? Mild neurocognitive disorder caused by infections, use of certain medicines or "recreational" drugs, and certain medical conditions may improve with treatment of the condition that is causing the disorder. Mild neurocognitive disorder resulting from other causes generally does not improve and may worsen. In these cases, the goal of treatment is to slow progression of the disorder and help you cope with the loss of mental function. Treatments in these cases include:  Medicine. Medicine helps mainly with memory loss and behavioral symptoms.  Talk therapy. Talk therapy provides education, emotional support, memory aids, and other ways of making up for decreases in mental function.  Lifestyle changes. These include regular exercise, a healthy diet (including essential omega-3 fatty acids), intellectual stimulation, and increased social interaction.  This information is not intended to replace advice given to you by your health care provider. Make sure you discuss any questions you have with your health care provider. Document Released: 01/25/2013 Document Revised: 10/31/2015 Document Reviewed: 10/17/2012 Elsevier Interactive Patient Education  2017 [employer].

## 2017-09-03 NOTE — Progress Notes (Signed)
Subjective  CC:  Chief Complaint  Patient presents with  . Medication Management    Wants to discuss Citalipram, having side effects for several years    HPI: Caleb Gibson is a 77 y.o. male who presents to the office today to address the problems listed above in the chief complaint.  Pt returns to discuss his claustrophobia again. See last two visits. We discussed changing to ativan from xanax as xanax was sedating and not as helpful anymore. However, pt took his "test dose" while in [place] - but didn't read the instructions and took 2 pills causing sedation. He hasn't taken any ativan for anxiety sxs related to claustrophobia for unclear reasons.   Memory: having more trouble in day to day life. Typically, had very strong math skills, now struggling with it. 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 Forgets things. Gets "tripped up" in the morning while reciting his daily scriptures. Wife notices the same. Forgets words. Denies tremor, gait abnormality or balance problems. No urinary incontinence. Mood is fine. He has attributed these things to "side effects" from celexa which he has been on w/o problems for last 15 years for claustrophobia. Denies daily anxiety, depressed mood or headaches. Review of MEDICAL RECORD NUMBERawv in 2018 - denies memory problems; cognitive screens in 2017 were normal.  He has a remote history of traumatic brain injury and h/o concussion.  mini-mental status exam MMSE - Mini Mental State Exam 09/03/2017  Orientation to time 5  Orientation to Place 5  Registration 3  Attention/ Calculation 3  Recall 2  Language- name 2 objects 2  Language- repeat 1  Language- follow 3 step command 2  Language- read & follow direction 1  Write a sentence 1  Copy design 1  Total score 26   Lab Results  Component Value Date   TSH 1.82 06/15/2017    I reviewed the patients updated PMH, FH, and SocHx.    Patient Active Problem List   Diagnosis Date Noted  . Tubular  adenoma of colon 06/15/2017    Priority: High  . On statin therapy due to risk of future cardiovascular event 10/08/2015    Priority: High  . Mixed hyperlipidemia 09/27/2014    Priority: High  . Idiopathic hypotension 01/07/2016    Priority: Medium  . BPH (benign prostatic hyperplasia) 10/01/2011    Priority: Medium  . Erectile dysfunction 10/01/2011    Priority: Medium  . Osteoarthritis, knee 08/01/2010    Priority: Medium  . Claustrophobia 07/08/2009    Priority: Medium  . Basal cell carcinoma of skin 06/15/2017    Priority: Low  . Simple cyst of kidney 11/12/2011    Priority: Low  . Allergic rhinitis 08/27/2011    Priority: Low   Current Meds  Medication Sig  . Amino Acids (L-CARNITINE PO) Take 250 mg by mouth daily.  Marland Kitchen CALCIUM CITRATE PO Take 600 mg by mouth daily.   . citalopram (CELEXA) 10 MG tablet Take 1 tablet (10 mg total) by mouth daily.  Marland Kitchen GARCINIA CAMBOGIA-CHROMIUM PO Take 1 tablet by mouth daily.  Marland Kitchen glucosamine-chondroitin 500-400 MG tablet Take 1 tablet by mouth daily.   . Lycopene 10 MG CAPS Take 1 capsule by mouth daily.   . Multiple Vitamins-Minerals (CENTRUM SILVER) tablet Take 1 tablet by mouth daily.  . Multiple Vitamins-Minerals (EYE VITAMINS PO)   . OVER THE COUNTER MEDICATION Take 120 mg by mouth daily. Ginko Biloba  . rosuvastatin (CRESTOR) 5 MG tablet Take 2.5 mg by mouth  daily.   . Saw Palmetto, Serenoa repens, 450 MG CAPS Take 1 capsule by mouth daily.   . sildenafil (VIAGRA) 100 MG tablet Take 1 tablet (100 mg total) by mouth daily as needed for erectile dysfunction.  . vitamin C (ASCORBIC ACID) 500 MG tablet Take 500 mg by mouth daily.     Allergies: Patient has No Known Allergies. Family History: Patient family history includes Alcohol abuse in his maternal aunt and mother; Cancer in his maternal grandfather; Cirrhosis in his maternal aunt; Diabetes in his father; Glaucoma in his father; Heart disease in his maternal grandmother; Melanoma in  his mother; Prostate cancer in his father. Social History:  Patient  reports that he has never smoked. He has never used smokeless tobacco. He reports that he drinks alcohol. He reports that he does not use drugs.  Review of Systems: Constitutional: Negative for fever malaise or anorexia Cardiovascular: negative for chest pain Respiratory: negative for SOB or persistent cough Gastrointestinal: negative for abdominal pain  Objective  Vitals: BP 110/76   Pulse 75   Temp 98.1 F (36.7 C) (Oral)   Resp 15   Ht 5\' 9"  (1.753 m)   Wt 233 lb 6.4 oz (105.9 kg)   SpO2 97%   BMI 34.47 kg/m  General: no acute distress , A&Ox3 HEENT: PEERL, conjunctiva normal, Oropharynx moist,neck is supple Cardiovascular:  RRR without murmur or gallop.  Respiratory:  Good breath sounds bilaterally, CTAB with normal respiratory effort Skin:  Warm, no rashes Neuro: no tremor, nl gait  Assessment  1. Mild cognitive impairment   2. Memory loss   3. Claustrophobia      Plan   MCI:  Decline in MMSE in areas of mathematics and following instructions: two areas where he should excel. He is confused about his medications again as well. Discussed possible MCI/early dementia. rec brain imaging with Ct scan and labs. Start aricept and recheck 3 months. Consider neuro eval.   Claustrophobia: to try ativan if needed.   Follow up: 3 month recheck and AWV in July    Commons side effects, risks, benefits, and alternatives for medications and treatment plan prescribed today were discussed, and the patient expressed understanding of the given instructions. Patient is instructed to call or message via MyChart if he/she has any questions or concerns regarding our treatment plan. No barriers to understanding were identified. We discussed Red Flag symptoms and signs in detail. Patient expressed understanding regarding what to do in case of urgent or emergency type symptoms.   Medication list was reconciled, printed and  provided to the patient in AVS. Patient instructions and summary information was reviewed with the patient as documented in the AVS. This note was prepared with assistance of Dragon voice recognition software. Occasional wrong-word or sound-a-like substitutions may have occurred due to the inherent limitations of voice recognition software  Orders Placed This Encounter  Procedures  . B12  . RPR   Meds ordered this encounter  Medications  . donepezil (ARICEPT) 10 MG tablet    Sig: Take 1 tablet (10 mg total) by mouth at bedtime.    Dispense:  30 tablet    Refill:  5

## 2017-09-06 LAB — RPR: RPR Ser Ql: NONREACTIVE

## 2017-09-06 NOTE — Progress Notes (Signed)
Please call patient: I have reviewed his/her lab results. Both vitamin B12 and RPR screens are normal.

## 2017-09-15 ENCOUNTER — Other Ambulatory Visit: Payer: Self-pay | Admitting: Family Medicine

## 2017-09-21 ENCOUNTER — Ambulatory Visit
Admission: RE | Admit: 2017-09-21 | Discharge: 2017-09-21 | Disposition: A | Discharge status: Home / Self Care | Payer: Medicare HMO | Source: Ambulatory Visit | Attending: Family Medicine | Admitting: Family Medicine

## 2017-09-21 DIAGNOSIS — R413 Other amnesia: Secondary | ICD-10-CM

## 2017-09-21 NOTE — Progress Notes (Signed)
Please call patient: I have reviewed his/her lab results. Head ct scan was normal for age.

## 2017-09-27 ENCOUNTER — Other Ambulatory Visit: Payer: Self-pay | Admitting: Emergency Medicine

## 2017-09-27 MED ORDER — CITALOPRAM HYDROBROMIDE 10 MG PO TABS
10.0000 mg | ORAL_TABLET | Freq: Every day | ORAL | 3 refills | Status: DC
Start: 2017-09-27 — End: 2018-08-15

## 2017-11-18 ENCOUNTER — Ambulatory Visit: Payer: Medicare HMO

## 2017-11-18 ENCOUNTER — Encounter: Payer: Self-pay | Admitting: Radiation Oncology

## 2017-11-18 NOTE — Progress Notes (Signed)
GU Location of Tumor / Histology: Prostatic adenocarcinoma  If Prostate Cancer, Gleason Score is (3 + 3) and PSA is (5.5). Prostate volume: 54.9 grams.  Caleb Gibson was previously followed by Dr. Risa Grill with a history of an elevated PSA without biopsy and a family history of prostate cancer. Patient now followed by Dr. Lovena Neighbours. Schedule to follow up with Dr. Lovena Neighbours on June 21 with decision about treatment.  06/2016  PSA  5.5 2016  PSA  3.9  Biopsies of prostate (if applicable) revealed:    Past/Anticipated interventions by urology, if any: prostate biopsy, referral to radiation oncology  Past/Anticipated interventions by medical oncology, if any: no  Weight changes, if any: no  Bowel/Bladder complaints, if any: IPSS 5. Denies dysuria or hematuria. Reports post void dribble x 30 years, unchanged.   Nausea/Vomiting, if any: no  Pain issues, if any:  no  SAFETY ISSUES:  Prior radiation? no  Pacemaker/ICD? no  Possible current pregnancy? no  Is the patient on methotrexate? no  Current Complaints / other details:  77 year old male. Married. Retired. Father died of prostate cancer. Patient has a hx of skin cancer. Resides in [place].

## 2017-11-22 ENCOUNTER — Other Ambulatory Visit: Payer: Self-pay

## 2017-11-22 ENCOUNTER — Encounter: Payer: Self-pay | Admitting: Medical Oncology

## 2017-11-22 ENCOUNTER — Encounter: Payer: Self-pay | Admitting: Radiation Oncology

## 2017-11-22 ENCOUNTER — Ambulatory Visit
Admission: RE | Admit: 2017-11-22 | Discharge: 2017-11-22 | Disposition: A | Discharge status: Home / Self Care | Payer: Medicare HMO | Source: Ambulatory Visit | Attending: Radiation Oncology | Admitting: Radiation Oncology

## 2017-11-22 VITALS — BP 108/67 | HR 116 | Temp 97.9°F | Resp 20 | Wt 223.0 lb

## 2017-11-22 DIAGNOSIS — C61 Malignant neoplasm of prostate: Secondary | ICD-10-CM | POA: Diagnosis present

## 2017-11-22 DIAGNOSIS — Z79899 Other long term (current) drug therapy: Secondary | ICD-10-CM | POA: Diagnosis not present

## 2017-11-22 DIAGNOSIS — Z85828 Personal history of other malignant neoplasm of skin: Secondary | ICD-10-CM | POA: Diagnosis not present

## 2017-11-22 DIAGNOSIS — F40240 Claustrophobia: Secondary | ICD-10-CM | POA: Insufficient documentation

## 2017-11-22 DIAGNOSIS — F419 Anxiety disorder, unspecified: Secondary | ICD-10-CM | POA: Insufficient documentation

## 2017-11-22 DIAGNOSIS — Z8042 Family history of malignant neoplasm of prostate: Secondary | ICD-10-CM | POA: Diagnosis not present

## 2017-11-22 DIAGNOSIS — E7800 Pure hypercholesterolemia, unspecified: Secondary | ICD-10-CM | POA: Diagnosis not present

## 2017-11-22 HISTORY — DX: Malignant neoplasm of prostate: C61

## 2017-11-22 NOTE — Progress Notes (Signed)
Introduced myself to Caleb Gibson and his wife as the prostate nurse navigator and my role. He states he has an early cancer and he could continue with active surveillance but for him this is not an option. He wants to get treatment. He is probably leaning towards external beam but will consider his options and discuss with Dr. Lovena Neighbours 11/26/17. I asked them to call with questions or concerns.

## 2017-11-22 NOTE — Progress Notes (Signed)
See progress note under physician encounter. 

## 2017-11-22 NOTE — Progress Notes (Signed)
Radiation Oncology         [phone] ________________________________  Initial outpatient Consultation  Name: Caleb Gibson MRN: [id number]  Date: 11/22/2017  DOB: 04/30/1941  CC:Caleb Arnt, MD  Davis Gourd*   REFERRING PHYSICIAN: Davis Gourd*  DIAGNOSIS: 77 y.o. gentleman with low risk, Stage T1c adenocarcinoma of the prostate with Gleason Score of 3+3=6, and PSA of 5.5    ICD-10-CM   1. Malignant neoplasm of prostate ([place]) C61     HISTORY OF PRESENT ILLNESS: Caleb Gibson is a 77 y.o. male with a diagnosis of prostate cancer. He was noted to have an elevated PSA of 5.5 by his primary care physician, Dr. Sabra Heck.  Accordingly, he was referred for evaluation in urology by Dr. Lovena Neighbours on 09/02/17,  digital rectal examination was performed at that time revealing an enlarged right lobe of prostate but no nodules.  The patient proceeded to transrectal ultrasound with 12 biopsies of the prostate on 10/11/17.  The prostate volume measured 54.9 cc.  Out of 12 core biopsies, 5 were positive.  The maximum Gleason score was 3+3=6, and this was seen in right base, right base lateral, right mid lateral, right apex, and right apex lateral. The patient reviewed the biopsy results with his urologist and he has kindly been referred today for discussion of potential radiation treatment options.   PREVIOUS RADIATION THERAPY: No  PAST MEDICAL HISTORY:  Past Medical History:  Diagnosis Date  . Anxiety   . Basal cell carcinoma of skin   . Claustrophobia   . High cholesterol   . Prostate cancer (Caleb Gibson)   . Tubular adenoma of colon 06/15/2017   Colonoscopy 08/2016; rec repeat in 5 years.       PAST SURGICAL HISTORY: Past Surgical History:  Procedure Laterality Date  . APPENDECTOMY    . DENTAL SURGERY     Had a tooth pulled in 01/2017  . KNEE ARTHROSCOPY WITH MENISCAL REPAIR Right   . MOHS SURGERY    . RHINOPLASTY    . TONSILLECTOMY AND ADENOIDECTOMY      FAMILY HISTORY:    Family History  Problem Relation Age of Onset  . Alcohol abuse Mother   . Melanoma Mother   . Prostate cancer Father   . Diabetes Father   . Glaucoma Father   . Heart disease Maternal Grandmother   . Alcohol abuse Maternal Aunt   . Cirrhosis Maternal Aunt     SOCIAL HISTORY:  Social History   Socioeconomic History  . Marital status: Married    Spouse name: Not on file  . Number of children: Not on file  . Years of education: Not on file  . Highest education level: Not on file  Occupational History    Comment: retired  Scientific laboratory technician  . Financial resource strain: Not on file  . Food insecurity:    Worry: Not on file    Inability: Not on file  . Transportation needs:    Medical: Not on file    Non-medical: Not on file  Tobacco Use  . Smoking status: Never Smoker  . Smokeless tobacco: Never Used  Substance and Sexual Activity  . Alcohol use: Yes    Comment: occ  . Drug use: No  . Sexual activity: Yes  Lifestyle  . Physical activity:    Days per week: Not on file    Minutes per session: Not on file  . Stress: Not on file  Relationships  . Social connections:  Talks on phone: Not on file    Gets together: Not on file    Attends religious service: Not on file    Active member of club or organization: Not on file    Attends meetings of clubs or organizations: Not on file    Relationship status: Not on file  . Intimate partner violence:    Fear of current or ex partner: Not on file    Emotionally abused: Not on file    Physically abused: Not on file    Forced sexual activity: Not on file  Other Topics Concern  . Not on file  Social History Narrative  . Not on file    ALLERGIES: Patient has no known allergies.  MEDICATIONS:  Current Outpatient Medications  Medication Sig Dispense Refill  . ALPRAZolam (XANAX) 0.5 MG tablet Take 0.5 mg by mouth at bedtime as needed for anxiety.    . Amino Acids (L-CARNITINE PO) Take 250 mg by mouth daily.    Marland Kitchen CALCIUM  CITRATE PO Take 600 mg by mouth daily.     . cholecalciferol (VITAMIN D) 1000 units tablet Take 1,000 Units by mouth daily.    . citalopram (CELEXA) 10 MG tablet Take 1 tablet (10 mg total) by mouth daily. 90 tablet 3  . donepezil (ARICEPT) 10 MG tablet Take 1 tablet (10 mg total) by mouth at bedtime. 30 tablet 5  . GARCINIA CAMBOGIA-CHROMIUM PO Take 1 tablet by mouth daily.    Marland Kitchen glucosamine-chondroitin 500-400 MG tablet Take 1 tablet by mouth daily.     . Lycopene 10 MG CAPS Take 1 capsule by mouth daily.     . Multiple Vitamins-Minerals (CENTRUM SILVER) tablet Take 1 tablet by mouth daily.    . Multiple Vitamins-Minerals (EYE VITAMINS PO)     . Omega-3 Fatty Acids (FISH OIL) 1200 MG CAPS Take by mouth.    Marland Kitchen OVER THE COUNTER MEDICATION Take 120 mg by mouth daily. Ginko Biloba    . rosuvastatin (CRESTOR) 5 MG tablet Take 2.5 mg by mouth daily.     . Saw Palmetto, Serenoa repens, 450 MG CAPS Take 1 capsule by mouth daily.     . sildenafil (VIAGRA) 100 MG tablet Take 1 tablet (100 mg total) by mouth daily as needed for erectile dysfunction. 10 tablet 11  . vitamin C (ASCORBIC ACID) 500 MG tablet Take 500 mg by mouth daily.      No current facility-administered medications for this encounter.     REVIEW OF SYSTEMS:  On review of systems, the patient reports that he is doing well overall. He denies any chest pain, shortness of breath, cough, fevers, chills, night sweats, unintended weight changes. He denies any bowel disturbances, and denies abdominal pain, nausea or vomiting. He denies any new musculoskeletal or joint aches or pains. His IPSS was 5, indicating mild urinary symptoms. He is successful to complete sexual activity with some attempts. A complete review of systems is obtained and is otherwise negative.    PHYSICAL EXAM:  Wt Readings from Last 3 Encounters:  11/22/17 223 lb (101.2 kg)  09/03/17 233 lb 6.4 oz (105.9 kg)  07/08/17 231 lb 9.6 oz (105.1 kg)   Temp Readings from Last 3  Encounters:  11/22/17 97.9 F (36.6 C) (Oral)  09/03/17 98.1 F (36.7 C) (Oral)  07/08/17 98.2 F (36.8 C)   BP Readings from Last 3 Encounters:  11/22/17 108/67  09/03/17 110/76  07/08/17 118/80   Pulse Readings from Last 3 Encounters:  11/22/17 (!) 116  09/03/17 75  07/08/17 72   In general this is a well appearing caucasian male in no acute distress. He is alert and oriented x4 and appropriate throughout the examination. HEENT reveals that the patient is normocephalic, atraumatic. EOMs are intact. PERRLA. Skin is intact without any evidence of gross lesions.  Cardiopulmonary assessment is negative for acute distress and he exhibits normal effort.   KPS = 100  100 - Normal; no complaints; no evidence of disease. 90   - Able to carry on normal activity; minor signs or symptoms of disease. 80   - Normal activity with effort; some signs or symptoms of disease. 37   - Cares for self; unable to carry on normal activity or to do active work. 60   - Requires occasional assistance, but is able to care for most of his personal needs. 50   - Requires considerable assistance and frequent medical care. 44   - Disabled; requires special care and assistance. 64   - Severely disabled; hospital admission is indicated although death not imminent. 73   - Very sick; hospital admission necessary; active supportive treatment necessary. 10   - Moribund; fatal processes progressing rapidly. 0     - Dead  Karnofsky DA, [place], Craver LS and Burchenal [facility] [phone]) The use of the nitrogen mustards in the palliative treatment of carcinoma: with particular reference to bronchogenic carcinoma Cancer 1 634-56  LABORATORY DATA:  Lab Results  Component Value Date   WBC 4.0 06/15/2017   HGB 14.4 06/15/2017   HCT 42.6 06/15/2017   MCV 95.4 06/15/2017   PLT 214.0 06/15/2017   Lab Results  Component Value Date   NA 138 06/15/2017   K 3.9 06/15/2017   CL 104 06/15/2017   CO2 27 06/15/2017   Lab  Results  Component Value Date   ALT 14 06/15/2017   AST 13 06/15/2017   ALKPHOS 57 06/15/2017   BILITOT 0.8 06/15/2017     RADIOGRAPHY: No results found.    IMPRESSION/PLAN: 1. 77 y.o. gentleman with low risk, Stage T1c adenocarcinoma of the prostate with Gleason Score of 3+3=6, and PSA of 5.5.  We spent time with the patient and family about the findings and work-up thus far.  We discussed the natural history of prostate cancer and general treatment, highlighting the role of radiotherapy in the management in addition to how Gleason score, PSA, and T staging impact recommendations. We discussed the available radiation techniques, and focused on the details of logistics and delivery.  We reviewed the anticipated acute and late sequelae associated with external beam and radioactive seed implant radiation in this setting.    We spent time reviewing active surveillance in detail and he is leaning that way at this time.   At the conclusion of our visit, he is going to consider his options and his next appointment with Dr. Lovena Neighbours is on 11/26/17.  I spent 60 minutes minutes face to face with the patient and more than 50% of that time was spent in counseling and/or coordination of care.     Carola Rhine, [facility]   Page Me  Seen with  _____________________________________  Sheral Apley Tammi Klippel, M.D.  This document serves as a record of services personally performed by Tyler Pita, MD and Shona Simpson, PA-C. It was created on their behalf by Linward Natal, a trained medical scribe. The creation of this record is based on the scribe's personal observations and the provider's statements to them.  This document has been checked and approved by the attending provider.

## 2017-11-30 ENCOUNTER — Ambulatory Visit: Payer: Self-pay | Admitting: Family Medicine

## 2017-12-14 ENCOUNTER — Ambulatory Visit: Payer: Medicare HMO | Admitting: Family Medicine

## 2017-12-15 ENCOUNTER — Telehealth: Payer: Self-pay | Admitting: Radiation Oncology

## 2017-12-15 NOTE — Telephone Encounter (Signed)
I spoke with the patient after he called back. He is pursuing active surveillance and sees Dr. Lovena Neighbours again on 12/26/17 for repeat PSA surveillance. We reviewed active surveillance with repeating biopsies with MR guidance at 12-18 month intervals. He will call us back if he has questions or concerns regarding treatment options for his cancer.

## 2017-12-15 NOTE — Telephone Encounter (Signed)
LM for pt to call me back regarding his decisions for treatment.

## 2017-12-16 ENCOUNTER — Telehealth: Payer: Self-pay | Admitting: Medical Oncology

## 2017-12-16 NOTE — Telephone Encounter (Signed)
Opened in error

## 2017-12-17 ENCOUNTER — Ambulatory Visit: Payer: Medicare HMO | Admitting: Family Medicine

## 2018-01-04 ENCOUNTER — Other Ambulatory Visit: Payer: Self-pay

## 2018-01-04 ENCOUNTER — Encounter: Payer: Self-pay | Admitting: Family Medicine

## 2018-01-04 ENCOUNTER — Ambulatory Visit (INDEPENDENT_AMBULATORY_CARE_PROVIDER_SITE_OTHER): Payer: Medicare HMO | Admitting: Family Medicine

## 2018-01-04 VITALS — BP 112/62 | HR 62 | Temp 98.0°F | Ht 69.0 in | Wt 222.0 lb

## 2018-01-04 DIAGNOSIS — G3184 Mild cognitive impairment, so stated: Secondary | ICD-10-CM

## 2018-01-04 DIAGNOSIS — E782 Mixed hyperlipidemia: Secondary | ICD-10-CM

## 2018-01-04 DIAGNOSIS — C61 Malignant neoplasm of prostate: Secondary | ICD-10-CM

## 2018-01-04 DIAGNOSIS — F40240 Claustrophobia: Secondary | ICD-10-CM

## 2018-01-04 MED ORDER — ROSUVASTATIN CALCIUM 5 MG PO TABS
2.5000 mg | ORAL_TABLET | Freq: Every day | ORAL | 3 refills | Status: DC
Start: 2018-01-04 — End: 2018-12-02

## 2018-01-04 MED ORDER — DONEPEZIL HCL 10 MG PO TABS: 10.0000 mg | ORAL_TABLET | Freq: Every day | ORAL | 3 refills | Status: DC

## 2018-01-04 NOTE — Progress Notes (Signed)
Subjective  CC:  Chief Complaint  Patient presents with  . Follow-up    3 Month Medication, memory better since on Medication    HPI: Caleb Gibson is a 77 y.o. male who presents to the office today to address the problems listed above in the chief complaint. And new dx of prostate ca and f/u on HLD and claustrophobia  MCI: started aricept 3 months ago after nl blood work and MMSE score 36 with nl noncontrast brain CT. Feels MUCH improved. Able to complete his thoughts/sentences now. More focused. Able to compute math in his head again. No AEs. Happy about positive results. Wife confirms these improvements as well.   Claustrophobia; manages behaviorally and with 1/2 0.5mg  ativan as needed.   Newly dxd prostate cancer. Reviewed dx and prognosis. Is on Active sureveillance protocol. Feels good about it.   HLD: ran out of crestor and couldn't get it filled. We reviewed his elevated ASCVD score. Has been on low dose for years and tolerating it. Eating well. Has lost weight.    Assessment  1. Mild cognitive impairment   2. Claustrophobia   3. Malignant neoplasm of prostate ([place])   4. Mixed hyperlipidemia      Plan   MCI:  Improved on aricept. For AWV in the next month. Please repeat the MMSE at that time for comparison.   Continue benzo prn for claustrophobia  Prostate CA - counseling done  HLD: restart low dose crestor.   Follow up: Return in about 5 months (around 06/06/2018) for complete physical.   No orders of the defined types were placed in this encounter.  Meds ordered this encounter  Medications  . donepezil (ARICEPT) 10 MG tablet    Sig: Take 1 tablet (10 mg total) by mouth at bedtime.    Dispense:  90 tablet    Refill:  3    Please substitute this RX for 90 day supply and cancel 30d supply refills. Thanks.  . rosuvastatin (CRESTOR) 5 MG tablet    Sig: Take 0.5 tablets (2.5 mg total) by mouth daily.    Dispense:  45 tablet    Refill:  3     The 10-year ASCVD  risk score [name] DC Jr., et al., 2013) is: 19.4%   Values used to calculate the score:     Age: 75 years     Sex: Male     Is Non-Hispanic African American: No     Diabetic: No     Tobacco smoker: No     Systolic Blood Pressure: 811 mmHg     Is BP treated: No     HDL Cholesterol: 72.7 mg/dL     Total Cholesterol: 161 mg/dL  Wt Readings from Last 3 Encounters:  01/04/18 222 lb (100.7 kg)  11/22/17 223 lb (101.2 kg)  09/03/17 233 lb 6.4 oz (105.9 kg)    I reviewed the patients updated PMH, FH, and SocHx.    Patient Active Problem List   Diagnosis Date Noted  . Malignant neoplasm of prostate ([place]) 11/22/2017    Priority: High  . Tubular adenoma of colon 06/15/2017    Priority: High  . On statin therapy due to risk of future cardiovascular event 10/08/2015    Priority: High  . Mixed hyperlipidemia 09/27/2014    Priority: High  . Idiopathic hypotension 01/07/2016    Priority: Medium  . BPH (benign prostatic hyperplasia) 10/01/2011    Priority: Medium  . Erectile dysfunction 10/01/2011    Priority: Medium  .  Osteoarthritis, knee 08/01/2010    Priority: Medium  . Claustrophobia 07/08/2009    Priority: Medium  . Basal cell carcinoma of skin 06/15/2017    Priority: Low  . Simple cyst of kidney 11/12/2011    Priority: Low  . Allergic rhinitis 08/27/2011    Priority: Low   Current Meds  Medication Sig  . ALPRAZolam (XANAX) 0.5 MG tablet Take 0.5 mg by mouth at bedtime as needed for anxiety.  . Amino Acids (L-CARNITINE PO) Take 250 mg by mouth daily.  Marland Kitchen CALCIUM CITRATE PO Take 600 mg by mouth daily.   . cholecalciferol (VITAMIN D) 1000 units tablet Take 1,000 Units by mouth daily.  . citalopram (CELEXA) 10 MG tablet Take 1 tablet (10 mg total) by mouth daily.  Marland Kitchen donepezil (ARICEPT) 10 MG tablet Take 1 tablet (10 mg total) by mouth at bedtime.  Marland Kitchen GARCINIA CAMBOGIA-CHROMIUM PO Take 1 tablet by mouth daily.  Marland Kitchen glucosamine-chondroitin 500-400 MG tablet Take 1 tablet by mouth  daily.   . Lycopene 10 MG CAPS Take 1 capsule by mouth daily.   . Multiple Vitamins-Minerals (CENTRUM SILVER) tablet Take 1 tablet by mouth daily.  . Multiple Vitamins-Minerals (EYE VITAMINS PO)   . Omega-3 Fatty Acids (FISH OIL) 1200 MG CAPS Take by mouth.  Marland Kitchen OVER THE COUNTER MEDICATION Take 120 mg by mouth daily. Ginko Biloba  . Saw Palmetto, Serenoa repens, 450 MG CAPS Take 1 capsule by mouth daily.   . vitamin C (ASCORBIC ACID) 500 MG tablet Take 500 mg by mouth daily.   . [DISCONTINUED] donepezil (ARICEPT) 10 MG tablet Take 1 tablet (10 mg total) by mouth at bedtime.  . [DISCONTINUED] sildenafil (VIAGRA) 100 MG tablet Take 1 tablet (100 mg total) by mouth daily as needed for erectile dysfunction.    Allergies: Patient has No Known Allergies. Family History: Patient family history includes Alcohol abuse in his maternal aunt and mother; Cirrhosis in his maternal aunt; Diabetes in his father; Glaucoma in his father; Heart disease in his maternal grandmother; Melanoma in his mother; Prostate cancer in his father. Social History:  Patient  reports that he has never smoked. He has never used smokeless tobacco. He reports that he drinks alcohol. He reports that he does not use drugs.  Review of Systems: Constitutional: Negative for fever malaise or anorexia Cardiovascular: negative for chest pain Respiratory: negative for SOB or persistent cough Gastrointestinal: negative for abdominal pain  Objective  Vitals: BP 112/62   Pulse 62   Temp 98 F (36.7 C)   Ht 5\' 9"  (1.753 m)   Wt 222 lb (100.7 kg)   SpO2 96%   BMI 32.78 kg/m  General: no acute distress , A&Ox3 HEENT: PEERL, conjunctiva normal, Oropharynx moist,neck is supple Cardiovascular:  RRR without murmur or gallop.  Respiratory:  Good breath sounds bilaterally, CTAB with normal respiratory effort Skin:  Warm, no rashes     Commons side effects, risks, benefits, and alternatives for medications and treatment plan  prescribed today were discussed, and the patient expressed understanding of the given instructions. Patient is instructed to call or message via MyChart if he/she has any questions or concerns regarding our treatment plan. No barriers to understanding were identified. We discussed Red Flag symptoms and signs in detail. Patient expressed understanding regarding what to do in case of urgent or emergency type symptoms.   Medication list was reconciled, printed and provided to the patient in AVS. Patient instructions and summary information was reviewed with the patient as  documented in the AVS. This note was prepared with assistance of Dragon voice recognition software. Occasional wrong-word or sound-a-like substitutions may have occurred due to the inherent limitations of voice recognition software

## 2018-01-04 NOTE — Patient Instructions (Addendum)
Please return in 5 months for labs   If you have any questions or concerns, please don't hesitate to send me a message via MyChart or call the office at [phone]. Thank you for visiting with [place] today! It's our pleasure caring for you.

## 2018-01-12 NOTE — Progress Notes (Signed)
Subjective:   Caleb Gibson is a 77 y.o. male who presents for Medicare Annual/Subsequent preventive examination.  Review of Systems:  No ROS.  Medicare Wellness Visit. Additional risk factors are reflected in the social history.  Cardiac Risk Factors include: advanced age (>5men, >45 women);dyslipidemia;male gender;obesity (BMI >30kg/m2);family history of premature cardiovascular disease   Sleep patterns: Sleeps 7 hours, feels rested. Up to void x 2.  Home Safety/Smoke Alarms: Feels safe in home. Smoke alarms in place.  Living environment; residence and Firearm Safety: Lives with wife in 2 story home.  Seat Belt Safety/Bike Helmet: Wears seat belt.   Male:   CCS-Colonoscopy 09/01/2016, polyps.     PSA-Followed by [facility]  Lab Results  Component Value Date   PSA 5.54 (H) 06/15/2017       Objective:    Vitals: BP 122/68 (BP Location: Left Arm, Patient Position: Sitting, Cuff Size: Normal)   Pulse 66   Ht 5\' 10"  (1.778 m)   Wt 218 lb 8 oz (99.1 kg)   SpO2 97%   BMI 31.35 kg/m   Body mass index is 31.35 kg/m.  Advanced Directives 01/13/2018 03/27/2016  Does Patient Have a Medical Advance Directive? Yes No  Type of Paramedic of [place];Living will -  Copy of [place] in Chart? No - copy requested -    Tobacco Social History   Tobacco Use  Smoking Status Never Smoker  Smokeless Tobacco Never Used     Counseling given: Not Answered    Past Medical History:  Diagnosis Date  . Anxiety   . Basal cell carcinoma of skin   . Claustrophobia   . High cholesterol   . Prostate cancer ([place])   . Tubular adenoma of colon 06/15/2017   Colonoscopy 08/2016; rec repeat in 5 years.    Past Surgical History:  Procedure Laterality Date  . APPENDECTOMY    . DENTAL SURGERY     Had a tooth pulled in 01/2017  . KNEE ARTHROSCOPY WITH MENISCAL REPAIR Right   . MOHS SURGERY    . RHINOPLASTY    . TONSILLECTOMY AND ADENOIDECTOMY       Family History  Problem Relation Age of Onset  . Alcohol abuse Mother   . Melanoma Mother   . Prostate cancer Father   . Diabetes Father   . Glaucoma Father   . Heart disease Maternal Grandmother   . Alcohol abuse Maternal Aunt   . Cirrhosis Maternal Aunt    Social History   Socioeconomic History  . Marital status: Married    Spouse name: Not on file  . Number of children: Not on file  . Years of education: Not on file  . Highest education level: Not on file  Occupational History    Comment: retired  Scientific laboratory technician  . Financial resource strain: Not on file  . Food insecurity:    Worry: Not on file    Inability: Not on file  . Transportation needs:    Medical: Not on file    Non-medical: Not on file  Tobacco Use  . Smoking status: Never Smoker  . Smokeless tobacco: Never Used  Substance and Sexual Activity  . Alcohol use: Yes    Comment: occ  . Drug use: No  . Sexual activity: Yes  Lifestyle  . Physical activity:    Days per week: Not on file    Minutes per session: Not on file  . Stress: Not on file  Relationships  .  Social connections:    Talks on phone: Not on file    Gets together: Not on file    Attends religious service: Not on file    Active member of club or organization: Not on file    Attends meetings of clubs or organizations: Not on file    Relationship status: Not on file  Other Topics Concern  . Not on file  Social History Narrative  . Not on file    Outpatient Encounter Medications as of 01/13/2018  Medication Sig  . ALPRAZolam (XANAX) 0.5 MG tablet Take 0.5 mg by mouth at bedtime as needed for anxiety.  . Amino Acids (L-CARNITINE PO) Take 250 mg by mouth daily.  Marland Kitchen CALCIUM CITRATE PO Take 600 mg by mouth daily.   . cholecalciferol (VITAMIN D) 1000 units tablet Take 1,000 Units by mouth daily.  . citalopram (CELEXA) 10 MG tablet Take 1 tablet (10 mg total) by mouth daily.  Marland Kitchen donepezil (ARICEPT) 10 MG tablet Take 1 tablet (10 mg total) by  mouth at bedtime.  Marland Kitchen GARCINIA CAMBOGIA-CHROMIUM PO Take 1 tablet by mouth daily.  Marland Kitchen glucosamine-chondroitin 500-400 MG tablet Take 1 tablet by mouth daily.   . Lycopene 10 MG CAPS Take 1 capsule by mouth daily.   . Multiple Vitamins-Minerals (CENTRUM SILVER) tablet Take 1 tablet by mouth daily.  . Multiple Vitamins-Minerals (EYE VITAMINS PO)   . Omega-3 Fatty Acids (FISH OIL) 1200 MG CAPS Take by mouth.  Marland Kitchen OVER THE COUNTER MEDICATION Take 120 mg by mouth daily. Ginko Biloba  . rosuvastatin (CRESTOR) 5 MG tablet Take 0.5 tablets (2.5 mg total) by mouth daily.  . Saw Palmetto, Serenoa repens, 450 MG CAPS Take 1 capsule by mouth daily.   . tadalafil (CIALIS) 5 MG tablet TAKE 1 TO 4 TABLETS BY MOUTH PRIOR TO INTERCOURSE  . vitamin C (ASCORBIC ACID) 500 MG tablet Take 500 mg by mouth daily.    No facility-administered encounter medications on file as of 01/13/2018.     Activities of Daily Living In your present state of health, do you have any difficulty performing the following activities: 01/13/2018 07/08/2017  Hearing? N N  Vision? N N  Difficulty concentrating or making decisions? Y N  Walking or climbing stairs? N N  Dressing or bathing? N N  Doing errands, shopping? N N  Preparing Food and eating ? N -  Using the Toilet? N -  In the past six months, have you accidently leaked urine? N -  Do you have problems with loss of bowel control? N -  Managing your Medications? N -  Managing your Finances? N -  Housekeeping or managing your Housekeeping? N -  Some recent data might be hidden    Patient Care Team: [name], MD as PCP - General (Family Medicine) Ceasar Mons, MD as Consulting Physician (Urology) [place], [place] (Specialist) Danella Sensing, MD as Consulting Physician (Dermatology) Ronnald Ramp (Dentistry)   Assessment:   This is a routine wellness examination for Caleb Gibson.  Exercise Activities and Dietary recommendations Current Exercise Habits: Home  exercise routine(yard work), Type of exercise: walking, Time (Minutes): 20, Frequency (Times/Week): 6, Weekly Exercise (Minutes/Week): 120, Exercise limited by: None identified   Diet (meal preparation, eat out, water intake, caffeinated beverages, dairy products, fruits and vegetables): Drinks crystal light and water, cranberry juice.   Breakfast: eggs/cheese/sausage Lunch: protein shake; salad  Dinner: chicken, vegetables  Goals    . Weight (lb) < 200 lb (90.7 kg)  Lose weight by eating healthy and continuing to be active.        Fall Risk Fall Risk  01/13/2018 11/22/2017 07/08/2017 06/15/2017  Falls in the past year? No No No No    Depression Screen PHQ 2/9 Scores 01/13/2018 11/22/2017 07/08/2017 06/15/2017  PHQ - 2 Score 0 0 0 0  PHQ- 9 Score - - 0 -    Cognitive Function MMSE - Mini Mental State Exam 01/13/2018 09/03/2017  Orientation to time 5 5  Orientation to Place 5 5  Registration 3 3  Attention/ Calculation 5 3  Recall 2 2  Language- name 2 objects 2 2  Language- repeat 1 1  Language- follow 3 step command 3 2  Language- read & follow direction 1 1  Write a sentence 1 1  Copy design 1 1  Total score 29 26        Immunization History  Administered Date(s) Administered  . Influenza Split 03/14/2011  . Influenza, High Dose Seasonal PF 02/27/2012, 02/05/2014, 03/10/2015, 04/14/2016, 03/02/2017  . Influenza, Seasonal, Injecte, Preservative Fre 02/27/2013  . Influenza-Unspecified 04/15/2017  . Pneumococcal Conjugate-13 02/05/2014  . Pneumococcal-Unspecified 07/08/2009  . Td 11/15/2007  . Tdap 10/01/2011  . Zoster 06/20/2010    Screening Tests Health Maintenance  Topic Date Due  . INFLUENZA VACCINE  01/06/2018  . COLONOSCOPY  09/01/2021  . TETANUS/TDAP  09/30/2021  . PNA vac Low Risk Adult  Completed         Plan:     Let us know about shingles vaccine (Shingrix).   Bring a copy of your living will and/or healthcare power of attorney to your next office  visit.  Continue doing brain stimulating activities (puzzles, reading, adult coloring books, staying active) to keep memory sharp.   I have personally reviewed and noted the following in the patient's chart:   . Medical and social history . Use of alcohol, tobacco or illicit drugs  . Current medications and supplements . Functional ability and status . Nutritional status . Physical activity . Advanced directives  . List of other physicians . Hospitalizations, surgeries, and ER visits in previous 12 months . Vitals . Screenings to include cognitive, depression, and falls . Referrals and appointments  In addition, I have reviewed and discussed with patient certain preventive protocols, quality metrics, and best practice recommendations. A written personalized care plan for preventive services as well as general preventive health recommendations were provided to patient.     Gerilyn Nestle, RN  01/13/2018  PCP Notes: -Pt has concerns regarding Citalopram, phone note sent to PCP.  -F/U with PCP 05/2018 (CPE)

## 2018-01-13 ENCOUNTER — Ambulatory Visit (INDEPENDENT_AMBULATORY_CARE_PROVIDER_SITE_OTHER): Payer: Medicare HMO

## 2018-01-13 ENCOUNTER — Other Ambulatory Visit: Payer: Self-pay

## 2018-01-13 ENCOUNTER — Telehealth: Payer: Self-pay

## 2018-01-13 VITALS — BP 122/68 | HR 66 | Ht 70.0 in | Wt 218.5 lb

## 2018-01-13 DIAGNOSIS — E669 Obesity, unspecified: Secondary | ICD-10-CM | POA: Diagnosis not present

## 2018-01-13 DIAGNOSIS — Z0000 Encounter for general adult medical examination without abnormal findings: Secondary | ICD-10-CM

## 2018-01-13 NOTE — Patient Instructions (Addendum)
Let us know about shingles vaccine (Shingrix).   Bring a copy of your living will and/or healthcare power of attorney to your next office visit.  Continue doing brain stimulating activities (puzzles, reading, adult coloring books, staying active) to keep memory sharp.   Health Maintenance, Male A healthy lifestyle and preventive care is important for your health and wellness. Ask your health care provider about what schedule of regular examinations is right for you. What should I know about weight and diet? Eat a Healthy Diet  Eat plenty of vegetables, fruits, whole grains, low-fat dairy products, and lean protein.  Do not eat a lot of foods high in solid fats, added sugars, or salt.  Maintain a Healthy Weight Regular exercise can help you achieve or maintain a healthy weight. You should:  Do at least 150 minutes of exercise each week. The exercise should increase your heart rate and make you sweat (moderate-intensity exercise).  Do strength-training exercises at least twice a week.  Watch Your Levels of Cholesterol and Blood Lipids  Have your blood tested for lipids and cholesterol every 5 years starting at 78 years of age. If you are at high risk for heart disease, you should start having your blood tested when you are 78 years old. You may need to have your cholesterol levels checked more often if: ? Your lipid or cholesterol levels are high. ? You are older than 78 years of age. ? You are at high risk for heart disease.  What should I know about cancer screening? Many types of cancers can be detected early and may often be prevented. Lung Cancer  You should be screened every year for lung cancer if: ? You are a current smoker who has smoked for at least 30 years. ? You are a former smoker who has quit within the past 15 years.  Talk to your health care provider about your screening options, when you should start screening, and how often you should be screened.  Colorectal  Cancer  Routine colorectal cancer screening usually begins at 78 years of age and should be repeated every 5-10 years until you are 78 years old. You may need to be screened more often if early forms of precancerous polyps or small growths are found. Your health care provider may recommend screening at an earlier age if you have risk factors for colon cancer.  Your health care provider may recommend using home test kits to check for hidden blood in the stool.  A small camera at the end of a tube can be used to examine your colon (sigmoidoscopy or colonoscopy). This checks for the earliest forms of colorectal cancer.  Prostate and Testicular Cancer  Depending on your age and overall health, your health care provider may do certain tests to screen for prostate and testicular cancer.  Talk to your health care provider about any symptoms or concerns you have about testicular or prostate cancer.  Skin Cancer  Check your skin from head to toe regularly.  Tell your health care provider about any new moles or changes in moles, especially if: ? There is a change in a mole's size, shape, or color. ? You have a mole that is larger than a pencil eraser.  Always use sunscreen. Apply sunscreen liberally and repeat throughout the day.  Protect yourself by wearing long sleeves, pants, a wide-brimmed hat, and sunglasses when outside.  What should I know about heart disease, diabetes, and high blood pressure?  If you are 18-39 years  of age, have your blood pressure checked every 3-5 years. If you are 43 years of age or older, have your blood pressure checked every year. You should have your blood pressure measured twice-once when you are at a hospital or clinic, and once when you are not at a hospital or clinic. Record the average of the two measurements. To check your blood pressure when you are not at a hospital or clinic, you can use: ? An automated blood pressure machine at a pharmacy. ? A home blood  pressure monitor.  Talk to your health care provider about your target blood pressure.  If you are between 85-20 years old, ask your health care provider if you should take aspirin to prevent heart disease.  Have regular diabetes screenings by checking your fasting blood sugar level. ? If you are at a normal weight and have a low risk for diabetes, have this test once every three years after the age of 75. ? If you are overweight and have a high risk for diabetes, consider being tested at a younger age or more often.  A one-time screening for abdominal aortic aneurysm (AAA) by ultrasound is recommended for men aged 78-75 years who are current or former smokers. What should I know about preventing infection? Hepatitis B If you have a higher risk for hepatitis B, you should be screened for this virus. Talk with your health care provider to find out if you are at risk for hepatitis B infection. Hepatitis C Blood testing is recommended for:  Everyone born from [birth year] through 1966.  Anyone with known risk factors for hepatitis C.  Sexually Transmitted Diseases (STDs)  You should be screened each year for STDs including gonorrhea and chlamydia if: ? You are sexually active and are younger than 78 years of age. ? You are older than 78 years of age and your health care provider tells you that you are at risk for this type of infection. ? Your sexual activity has changed since you were last screened and you are at an increased risk for chlamydia or gonorrhea. Ask your health care provider if you are at risk.  Talk with your health care provider about whether you are at high risk of being infected with HIV. Your health care provider may recommend a prescription medicine to help prevent HIV infection.  What else can I do?  Schedule regular health, dental, and eye exams.  Stay current with your vaccines (immunizations).  Do not use any tobacco products, such as cigarettes, chewing tobacco, and  e-cigarettes. If you need help quitting, ask your health care provider.  Limit alcohol intake to no more than 2 drinks per day. One drink equals 12 ounces of beer, 5 ounces of wine, or 1 ounces of hard liquor.  Do not use street drugs.  Do not share needles.  Ask your health care provider for help if you need support or information about quitting drugs.  Tell your health care provider if you often feel depressed.  Tell your health care provider if you have ever been abused or do not feel safe at home. This information is not intended to replace advice given to you by your health care provider. Make sure you discuss any questions you have with your health care provider. Document Released: 11/21/2007 Document Revised: 01/22/2016 Document Reviewed: 02/26/2015 Elsevier Interactive Patient Education  [employer].

## 2018-01-13 NOTE — Progress Notes (Signed)
I have reviewed the documentation from the recent AWV done by Kim Broome; I agree with the documentation and will follow up on any recommendations or abnormal findings as suggested.  

## 2018-01-13 NOTE — Telephone Encounter (Signed)
Patient in for AWV today with questions regarding Citalopram.  Pt states he has been taking Citalopram "for years" for claustrophobia. Now reports side effects (decreased sexual desire and "foggy headed") therefore would like to discontinue medication and use prn Xanax with claustrophobia issues. Patient requesting instruction/advisement on proper way of discontinuing. Please advise.

## 2018-01-13 NOTE — Telephone Encounter (Signed)
Discussed with patient of Dr Jonni Sanger recommendations. He is agreeable and will continue medications as directed. He will discuss at appointment in Dec further.

## 2018-01-13 NOTE — Telephone Encounter (Signed)
LM to [facility]   CRM Created.

## 2018-01-13 NOTE — Telephone Encounter (Signed)
We have had this discussion.  Since he is doing better now on aricept and with managing his claustrophobia, I do not recommend changing medications at this time. Let's revisit in 3 - 75months; he has been on citalopram for a number of years and it is likely helping with anxiety as well.

## 2018-01-20 ENCOUNTER — Encounter: Payer: Self-pay | Admitting: Medical Oncology

## 2018-01-20 NOTE — Progress Notes (Signed)
Caleb Gibson followed up with Dr. Lovena Neighbours 12/27/17. He has decided to continue active surveillance with PSA checks every 6 months and repeat biopsy in a year.

## 2018-05-10 ENCOUNTER — Encounter: Payer: Self-pay | Admitting: Family Medicine

## 2018-05-10 ENCOUNTER — Ambulatory Visit (INDEPENDENT_AMBULATORY_CARE_PROVIDER_SITE_OTHER): Payer: Medicare HMO | Admitting: Family Medicine

## 2018-05-10 ENCOUNTER — Other Ambulatory Visit: Payer: Self-pay

## 2018-05-10 VITALS — BP 114/72 | HR 66 | Temp 97.9°F | Ht 70.0 in | Wt 220.8 lb

## 2018-05-10 DIAGNOSIS — C61 Malignant neoplasm of prostate: Secondary | ICD-10-CM | POA: Diagnosis not present

## 2018-05-10 DIAGNOSIS — G3184 Mild cognitive impairment, so stated: Secondary | ICD-10-CM

## 2018-05-10 DIAGNOSIS — Z0000 Encounter for general adult medical examination without abnormal findings: Secondary | ICD-10-CM

## 2018-05-10 DIAGNOSIS — F40240 Claustrophobia: Secondary | ICD-10-CM | POA: Diagnosis not present

## 2018-05-10 DIAGNOSIS — Z79899 Other long term (current) drug therapy: Secondary | ICD-10-CM | POA: Diagnosis not present

## 2018-05-10 DIAGNOSIS — N400 Enlarged prostate without lower urinary tract symptoms: Secondary | ICD-10-CM

## 2018-05-10 HISTORY — DX: Mild cognitive impairment of uncertain or unknown etiology: G31.84

## 2018-05-10 LAB — COMPREHENSIVE METABOLIC PANEL
ALT: 14 U/L (ref 0–53)
AST: 14 U/L (ref 0–37)
Albumin: 4.7 g/dL (ref 3.5–5.2)
Alkaline Phosphatase: 60 U/L (ref 39–117)
BUN: 16 mg/dL (ref 6–23)
CHLORIDE: 103 meq/L (ref 96–112)
CO2: 29 meq/L (ref 19–32)
Calcium: 9.4 mg/dL (ref 8.4–10.5)
Creatinine, Ser: 0.93 mg/dL (ref 0.40–1.50)
GFR: 83.5 mL/min (ref >60.00)
GLUCOSE: 96 mg/dL (ref 70–99)
POTASSIUM: 4.1 meq/L (ref 3.5–5.1)
Sodium: 138 mEq/L (ref 135–145)
Total Bilirubin: 0.7 mg/dL (ref 0.2–1.2)
Total Protein: 6.6 g/dL (ref 6.0–8.3)

## 2018-05-10 LAB — CBC WITH DIFFERENTIAL/PLATELET
BASOS ABS: 0 10*3/uL (ref 0.0–0.1)
BASOS PCT: 0.9 % (ref 0.0–3.0)
EOS PCT: 2.2 % (ref 0.0–5.0)
Eosinophils Absolute: 0.1 10*3/uL (ref 0.0–0.7)
HCT: 41.3 % (ref 39.0–52.0)
Hemoglobin: 14.1 g/dL (ref 13.0–17.0)
Lymphocytes Relative: 22.7 % (ref 12.0–46.0)
Lymphs Abs: 0.9 10*3/uL (ref 0.7–4.0)
MCHC: 34 g/dL (ref 30.0–36.0)
MCV: 94.2 fl (ref 78.0–100.0)
Monocytes Absolute: 0.4 10*3/uL (ref 0.1–1.0)
Monocytes Relative: 11 % (ref 3.0–12.0)
NEUTROS PCT: 63.2 % (ref 43.0–77.0)
Neutro Abs: 2.5 10*3/uL (ref 1.4–7.7)
PLATELETS: 202 10*3/uL (ref 150.0–400.0)
RBC: 4.39 Mil/uL (ref 4.22–5.81)
RDW: 13.1 % (ref 11.5–15.5)
WBC: 4 10*3/uL (ref 4.0–10.5)

## 2018-05-10 LAB — LIPID PANEL
CHOL/HDL RATIO: 2
Cholesterol: 155 mg/dL (ref 0–200)
HDL: 76.7 mg/dL (ref >39.00)
LDL Cholesterol: 67 mg/dL (ref 0–99)
NONHDL: 78.41
TRIGLYCERIDES: 57 mg/dL (ref 0.0–149.0)
VLDL: 11.4 mg/dL (ref 0.0–40.0)

## 2018-05-10 NOTE — Patient Instructions (Signed)
Please return in 6 months For memory recheck  If you have any questions or concerns, please don't hesitate to send me a message via MyChart or call the office at [phone]. Thank you for visiting with [place] today! It's our pleasure caring for you.  Please do these things to maintain good health!   Exercise at least 30-45 minutes a day,  4-5 days a week.   Eat a low-fat diet with lots of fruits and vegetables, up to 7-9 servings per day.  Drink plenty of water daily. Try to drink 8 8oz glasses per day.  Seatbelts can save your life. Always wear your seatbelt.  Place Smoke Detectors on every level of your home and check batteries every year.  Eye Doctor - have an eye exam every 1-2 years  Safe sex - use condoms to protect yourself from STDs if you could be exposed to these types of infections.  Avoid heavy alcohol use. If you drink, keep it to less than 2 drinks/day and not every day.  [place].  Choose someone you trust that could speak for you if you became unable to speak for yourself.  Depression is common in our stressful world.If you're feeling down or losing interest in things you normally enjoy, please come in for a visit.

## 2018-05-10 NOTE — Progress Notes (Signed)
Subjective  Chief Complaint  Patient presents with  . Annual Exam    doing well, Patient is fasting. HM up to date  . Erectile Dysfunction    wants to discuss other medication options     HPI: Caleb Gibson is a 77 y.o. male who presents to [place] at [facility] today for a Male Wellness Visit. He also has the concerns and/or needs as listed above in the chief complaint. These will be addressed in addition to the Health Maintenance Visit.   Wellness Visit: annual visit with health maintenance review and exam    HM: up to date. imms up to date. Screens up to date.   Living healthy lifestyle. Feels mostly well, has aches and pains.  Lifestyle: Body mass index is 31.68 kg/m. Wt Readings from Last 3 Encounters:  05/10/18 220 lb 12.8 oz (100.2 kg)  01/13/18 218 lb 8 oz (99.1 kg)  01/04/18 222 lb (100.7 kg)     Chronic disease management visit and/or acute problem visit:  ED: on low dose cialis but doesn't work well. Gets headaches if uses higher dose and same with viagra. Has a urologist.   Memory concerns: mild cognitive impairment on aricept. Stable and improved. Still with poor function if gets distracted.   Prostate cancer: has f/u in January   Statin use for prevention:tolerating fine.   Claustrophobia is stable.   Patient Active Problem List   Diagnosis Date Noted  . Mild cognitive impairment 05/10/2018    Priority: High  . Malignant neoplasm of prostate ([place]) 11/22/2017    Priority: High  . Tubular adenoma of colon 06/15/2017    Priority: High  . On statin therapy due to risk of future cardiovascular event 10/08/2015    Priority: High  . Mixed hyperlipidemia 09/27/2014    Priority: High  . Idiopathic hypotension 01/07/2016    Priority: Medium  . BPH (benign prostatic hyperplasia) 10/01/2011    Priority: Medium  . Erectile dysfunction 10/01/2011    Priority: Medium  . Osteoarthritis, knee 08/01/2010    Priority: Medium  .  Claustrophobia 07/08/2009    Priority: Medium  . Basal cell carcinoma of skin 06/15/2017    Priority: Low  . Simple cyst of kidney 11/12/2011    Priority: Low  . Allergic rhinitis 08/27/2011    Priority: Low   Health Maintenance  Topic Date Due  . COLONOSCOPY  09/01/2021  . TETANUS/TDAP  09/30/2021  . INFLUENZA VACCINE  Completed  . PNA vac Low Risk Adult  Completed   Immunization History  Administered Date(s) Administered  . Influenza Split 03/14/2011  . Influenza, High Dose Seasonal PF 02/27/2012, 02/05/2014, 03/10/2015, 04/14/2016, 03/02/2017, 02/23/2018  . Influenza, Seasonal, Injecte, Preservative Fre 02/27/2013  . Influenza-Unspecified 04/15/2017  . Pneumococcal Conjugate-13 02/05/2014  . Pneumococcal-Unspecified 07/08/2009  . Td 11/15/2007  . Tdap 10/01/2011  . Zoster 06/20/2010  . Zoster Recombinat (Shingrix) 02/05/2018, 04/18/2018   We updated and reviewed the patient's past history in detail and it is documented below. Allergies: Patient is allergic to viagra [sildenafil citrate]. Past Medical History  has a past medical history of Anxiety, Basal cell carcinoma of skin, Claustrophobia, High cholesterol, Prostate cancer ([place]), and Tubular adenoma of colon (06/15/2017). Past Surgical History Patient  has a past surgical history that includes Appendectomy; Dental surgery; Rhinoplasty; Tonsillectomy and adenoidectomy; Mohs surgery; and Knee arthroscopy with meniscal repair (Right). Social History Patient  reports that he has never smoked. He has never used smokeless tobacco. He reports that he  drinks alcohol. He reports that he does not use drugs. Family History family history includes Alcohol abuse in his maternal aunt and mother; Cirrhosis in his maternal aunt; Diabetes in his father; Glaucoma in his father; Heart disease in his maternal grandmother; Melanoma in his mother; Prostate cancer in his father. Review of Systems: Constitutional: negative for fever or  malaise Ophthalmic: negative for photophobia, double vision or loss of vision Cardiovascular: negative for chest pain, dyspnea on exertion, or new LE swelling Respiratory: negative for SOB or persistent cough Gastrointestinal: negative for abdominal pain, change in bowel habits or melena Genitourinary: negative for dysuria or gross hematuria Musculoskeletal: negative for new gait disturbance or muscular weakness Integumentary: negative for new or persistent rashes Neurological: negative for TIA or stroke symptoms Psychiatric: negative for SI or delusions Allergic/Immunologic: negative for hives  Patient Care Team    Relationship Specialty Notifications Start End  [name], MD PCP - General Family Medicine  06/15/17   Ceasar Mons, MD Consulting Physician Urology  01/13/18   Pa, [facility]opa[facility]  Specialist  01/13/18   Danella Sensing, MD Consulting Physician Dermatology  01/13/18   Ronnald Ramp  Dentistry  01/13/18    Objective  Vitals: BP 114/72   Pulse 66   Temp 97.9 F (36.6 C)   Ht 5\' 10"  (1.778 m)   Wt 220 lb 12.8 oz (100.2 kg)   SpO2 96%   BMI 31.68 kg/m  General:  Well developed, well nourished, no acute distress  Psych:  Alert and orientedx3,normal mood and affect HEENT:  Normocephalic, atraumatic, non-icteric sclera, PERRL, oropharynx is clear without mass or exudate, supple neck without adenopathy, mass or thyromegaly Cardiovascular:  Normal S1, S2, RRR without gallop, rub or murmur, nondisplaced PMI, +2 distal pulses in bilateral upper and lower extremities. Respiratory:  Good breath sounds bilaterally, CTAB with normal respiratory effort Gastrointestinal: normal bowel sounds, soft, non-tender, no noted masses. No HSM MSK: no deformities, contusions. Joints are without erythema or swelling. Spine and CVA region are nontender Skin:  Warm, no rashes or suspicious lesions noted Neurologic:    Mental status is normal. CN 2-11 are normal. Gross motor and sensory  exams are normal. Stable gait. No tremor GU: No inguinal hernias or adenopathy are appreciated bilaterally   MMSE - Mini Mental State Exam 01/13/2018 09/03/2017  Orientation to time 5 5  Orientation to Place 5 5  Registration 3 3  Attention/ Calculation 5 3  Recall 2 2  Language- name 2 objects 2 2  Language- repeat 1 1  Language- follow 3 step command 3 2  Language- read & follow direction 1 1  Write a sentence 1 1  Copy design 1 1  Total score 29 26    Assessment  1. Annual physical exam   2. Malignant neoplasm of prostate ([place])   3. On statin therapy due to risk of future cardiovascular event   4. Claustrophobia   5. Benign prostatic hyperplasia without lower urinary tract symptoms   6. Mild cognitive impairment      Plan  Male Wellness Visit:  Age appropriate Health Maintenance and Prevention measures were discussed with patient. Included topics are cancer screening recommendations, ways to keep healthy (see AVS) including dietary and exercise recommendations, regular eye and dental care, use of seat belts, and avoidance of moderate alcohol use and tobacco use.   BMI: discussed patient's BMI and encouraged positive lifestyle modifications to help get to or maintain a target BMI.  HM needs and immunizations  were addressed and ordered. See below for orders. See HM and immunization section for updates.  Routine labs and screening tests ordered including cmp, cbc and lipids where appropriate.  Discussed recommendations regarding Vit D and calcium supplementation (see AVS)  Chronic disease f/u and/or acute problem visit: (deemed necessary to be done in addition to the wellness visit):  Chronic problems are stable. Continue all meds. Check lipids and lfts on statin.   ED and prostate CA: rec f/u with urology to see if there are other options of treatment that would work better w/out side effects.    Follow up: Return in about 6 months (around 11/09/2018) for memory recheck.    Commons side effects, risks, benefits, and alternatives for medications and treatment plan prescribed today were discussed, and the patient expressed understanding of the given instructions. Patient is instructed to call or message via MyChart if he/she has any questions or concerns regarding our treatment plan. No barriers to understanding were identified. We discussed Red Flag symptoms and signs in detail. Patient expressed understanding regarding what to do in case of urgent or emergency type symptoms.   Medication list was reconciled, printed and provided to the patient in AVS. Patient instructions and summary information was reviewed with the patient as documented in the AVS. This note was prepared with assistance of Dragon voice recognition software. Occasional wrong-word or sound-a-like substitutions may have occurred due to the inherent limitations of voice recognition software  Orders Placed This Encounter  Procedures  . CBC with Differential/Platelet  . Comprehensive metabolic panel  . Lipid panel   No orders of the defined types were placed in this encounter.

## 2018-05-10 NOTE — Progress Notes (Signed)
Lab results mailed to patient in letter. Normal results. No action / follow up needed on these results.

## 2018-05-10 NOTE — Progress Notes (Signed)
I have reviewed results. Normal. Patient notified by letter. Please see letter for details. 

## 2018-05-23 ENCOUNTER — Encounter: Payer: Self-pay | Admitting: Family Medicine

## 2018-06-16 ENCOUNTER — Ambulatory Visit (INDEPENDENT_AMBULATORY_CARE_PROVIDER_SITE_OTHER): Payer: Medicare HMO | Admitting: Family Medicine

## 2018-06-16 ENCOUNTER — Other Ambulatory Visit: Payer: Self-pay

## 2018-06-16 ENCOUNTER — Encounter: Payer: Self-pay | Admitting: Family Medicine

## 2018-06-16 VITALS — BP 114/70 | HR 57 | Temp 98.7°F | Resp 16 | Ht 70.0 in | Wt 229.6 lb

## 2018-06-16 DIAGNOSIS — G3184 Mild cognitive impairment, so stated: Secondary | ICD-10-CM

## 2018-06-16 DIAGNOSIS — G4752 REM sleep behavior disorder: Secondary | ICD-10-CM | POA: Diagnosis not present

## 2018-06-16 DIAGNOSIS — F514 Sleep terrors [night terrors]: Secondary | ICD-10-CM | POA: Diagnosis not present

## 2018-06-16 NOTE — Patient Instructions (Signed)
Please follow up if symptoms do not improve or as needed.   I will let you know what I'd like you to do regarding your sleep problems.

## 2018-06-16 NOTE — Progress Notes (Signed)
Subjective  CC:  Chief Complaint  Patient presents with  . Bad Dreams    Has always had, few weeks ago one caused him to punch the wall/head board    HPI: Caleb Gibson is a 78 y.o. male who presents to the office today to address the problems listed above in the chief complaint.  Pt reports long history of sleep problems: vivid dreams and awakens acting them out.  Typically happens once or twice a year. A few weeks ago, after watching a particularly violent movie, he was awakened after his wife found him thrashing and punching the head board. In the past, he has kicked things. He is concerned for the safety of his wife.   No new medications. He reports this started as a young child. But now it is becoming more violent.   He is on aricept for MCI and feels it is much better.   Assessment  1. RBD (REM behavioral disorder)   2. Night terrors, adult   3. Mild cognitive impairment      Plan   Possible RBD:  Will start melatonin (has used clonopin in the past for claustrophobia and didn't tolerate it well). Refer to neuro for further assessment and due to h/o MCI and possible association with risk of PD or dementia, will get neuro involved now.   Discussed safety at home. Fortunately, his episodes have been rare.   Follow up: No follow-ups on file.  11/08/2018  Orders Placed This Encounter  Procedures  . Ambulatory referral to Neurology   No orders of the defined types were placed in this encounter.     I reviewed the patients updated PMH, FH, and SocHx.    Patient Active Problem List   Diagnosis Date Noted  . Mild cognitive impairment 05/10/2018    Priority: High  . Malignant neoplasm of prostate ([place]) 11/22/2017    Priority: High  . Tubular adenoma of colon 06/15/2017    Priority: High  . On statin therapy due to risk of future cardiovascular event 10/08/2015    Priority: High  . Mixed hyperlipidemia 09/27/2014    Priority: High  . Idiopathic hypotension 01/07/2016      Priority: Medium  . BPH (benign prostatic hyperplasia) 10/01/2011    Priority: Medium  . Erectile dysfunction 10/01/2011    Priority: Medium  . Osteoarthritis, knee 08/01/2010    Priority: Medium  . Claustrophobia 07/08/2009    Priority: Medium  . Basal cell carcinoma of skin 06/15/2017    Priority: Low  . Simple cyst of kidney 11/12/2011    Priority: Low  . Allergic rhinitis 08/27/2011    Priority: Low   Current Meds  Medication Sig  . ALPRAZolam (XANAX) 0.5 MG tablet Take 0.5 mg by mouth at bedtime as needed for anxiety.  . Amino Acids (L-CARNITINE PO) Take 250 mg by mouth daily.  Marland Kitchen CALCIUM CITRATE PO Take 600 mg by mouth daily.   . cholecalciferol (VITAMIN D) 1000 units tablet Take 1,000 Units by mouth daily.  . citalopram (CELEXA) 10 MG tablet Take 1 tablet (10 mg total) by mouth daily.  Marland Kitchen donepezil (ARICEPT) 10 MG tablet Take 1 tablet (10 mg total) by mouth at bedtime.  Marland Kitchen GARCINIA CAMBOGIA-CHROMIUM PO Take 1 tablet by mouth daily.  Marland Kitchen glucosamine-chondroitin 500-400 MG tablet Take 1 tablet by mouth daily.   . Lycopene 10 MG CAPS Take 1 capsule by mouth daily.   . Multiple Vitamins-Minerals (CENTRUM SILVER) tablet Take 1 tablet by mouth daily.  Marland Kitchen  Multiple Vitamins-Minerals (EYE VITAMINS PO)   . Omega-3 Fatty Acids (FISH OIL) 1200 MG CAPS Take by mouth.  Marland Kitchen OVER THE COUNTER MEDICATION Take 120 mg by mouth daily. Ginko Biloba  . rosuvastatin (CRESTOR) 5 MG tablet Take 0.5 tablets (2.5 mg total) by mouth daily.  . Saw Palmetto, Serenoa repens, 450 MG CAPS Take 1 capsule by mouth daily.   . tadalafil (CIALIS) 5 MG tablet TAKE 1 TO 4 TABLETS BY MOUTH PRIOR TO INTERCOURSE  . vitamin C (ASCORBIC ACID) 500 MG tablet Take 500 mg by mouth daily.     Allergies: Patient is allergic to viagra [sildenafil citrate]. Family History: Patient family history includes Alcohol abuse in his maternal aunt and mother; Cirrhosis in his maternal aunt; Diabetes in his father; Glaucoma in his  father; Heart disease in his maternal grandmother; Melanoma in his mother; Prostate cancer in his father. Social History:  Patient  reports that he has never smoked. He has never used smokeless tobacco. He reports current alcohol use. He reports that he does not use drugs.  Review of Systems: Constitutional: Negative for fever malaise or anorexia Cardiovascular: negative for chest pain Respiratory: negative for SOB or persistent cough Gastrointestinal: negative for abdominal pain  Objective  Vitals: BP 114/70   Pulse (!) 57   Temp 98.7 F (37.1 C) (Oral)   Resp 16   Ht 5\' 10"  (1.778 m)   Wt 229 lb 9.6 oz (104.1 kg)   SpO2 96%   BMI 32.94 kg/m  General: no acute distress , A&Ox3     Commons side effects, risks, benefits, and alternatives for medications and treatment plan prescribed today were discussed, and the patient expressed understanding of the given instructions. Patient is instructed to call or message via MyChart if he/she has any questions or concerns regarding our treatment plan. No barriers to understanding were identified. We discussed Red Flag symptoms and signs in detail. Patient expressed understanding regarding what to do in case of urgent or emergency type symptoms.   Medication list was reconciled, printed and provided to the patient in AVS. Patient instructions and summary information was reviewed with the patient as documented in the AVS. This note was prepared with assistance of Dragon voice recognition software. Occasional wrong-word or sound-a-like substitutions may have occurred due to the inherent limitations of voice recognition software

## 2018-06-17 NOTE — Progress Notes (Signed)
LMOVM for pt to return call. CRM created/

## 2018-06-20 NOTE — Progress Notes (Signed)
Message given to pt via [facility] triage.

## 2018-08-04 ENCOUNTER — Ambulatory Visit: Payer: Medicare HMO | Admitting: Neurology

## 2018-08-04 ENCOUNTER — Telehealth: Payer: Self-pay | Admitting: Neurology

## 2018-08-04 ENCOUNTER — Encounter: Payer: Self-pay | Admitting: Neurology

## 2018-08-04 VITALS — BP 131/80 | HR 62 | Ht 70.0 in | Wt 229.0 lb

## 2018-08-04 DIAGNOSIS — R413 Other amnesia: Secondary | ICD-10-CM

## 2018-08-04 DIAGNOSIS — Z87828 Personal history of other (healed) physical injury and trauma: Secondary | ICD-10-CM | POA: Diagnosis not present

## 2018-08-04 DIAGNOSIS — G479 Sleep disorder, unspecified: Secondary | ICD-10-CM | POA: Insufficient documentation

## 2018-08-04 MED ORDER — DIAZEPAM 5 MG PO TABS: 5.0000 mg | ORAL_TABLET | Freq: Four times a day (QID) | ORAL | 0 refills | Status: DC | PRN

## 2018-08-04 MED ORDER — CLONAZEPAM 0.5 MG PO TABS: 0.5000 mg | ORAL_TABLET | Freq: Every evening | ORAL | 3 refills | Status: DC | PRN

## 2018-08-04 NOTE — Telephone Encounter (Signed)
error 

## 2018-08-04 NOTE — Telephone Encounter (Signed)
noted 
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
## 2018-08-04 NOTE — Telephone Encounter (Signed)
[name]: [phone] (exp. 08/04/18 to 09/03/18) order faxed to [facility] they will reach out to the pt to schedule. The phone number is [phone].

## 2018-08-04 NOTE — Telephone Encounter (Signed)
He needs Open MRI

## 2018-08-05 NOTE — Telephone Encounter (Signed)
Patient is scheduled for 08/11/18.

## 2018-08-15 ENCOUNTER — Other Ambulatory Visit: Payer: Self-pay | Admitting: Family Medicine

## 2018-08-15 NOTE — Telephone Encounter (Signed)
Last OV: 06/16/18 Last Fill: 09/27/17 #90 3 refs

## 2018-08-17 ENCOUNTER — Telehealth: Payer: Self-pay | Admitting: Neurology

## 2018-08-17 NOTE — Telephone Encounter (Signed)
Spoke to the patient and his wife.  They are both aware of the MRI results and verbalized understanding.

## 2018-08-17 NOTE — Telephone Encounter (Signed)
Please call patient, MRI of brain showed moderate generalized atrophy, no acute abnomralities

## 2018-09-12 ENCOUNTER — Other Ambulatory Visit: Payer: Medicare HMO

## 2018-10-11 DIAGNOSIS — H1013 Acute atopic conjunctivitis, bilateral: Secondary | ICD-10-CM | POA: Diagnosis not present

## 2018-10-11 DIAGNOSIS — H1851 Endothelial corneal dystrophy: Secondary | ICD-10-CM | POA: Diagnosis not present

## 2018-11-08 ENCOUNTER — Ambulatory Visit: Payer: Medicare HMO | Admitting: Family Medicine

## 2018-11-08 DIAGNOSIS — H26492 Other secondary cataract, left eye: Secondary | ICD-10-CM | POA: Diagnosis not present

## 2018-11-15 ENCOUNTER — Ambulatory Visit: Payer: Medicare HMO | Admitting: Family Medicine

## 2018-11-21 ENCOUNTER — Ambulatory Visit (INDEPENDENT_AMBULATORY_CARE_PROVIDER_SITE_OTHER): Payer: Medicare HMO | Admitting: Neurology

## 2018-11-21 ENCOUNTER — Other Ambulatory Visit: Payer: Self-pay

## 2018-11-21 DIAGNOSIS — R413 Other amnesia: Secondary | ICD-10-CM

## 2018-11-21 DIAGNOSIS — R410 Disorientation, unspecified: Secondary | ICD-10-CM | POA: Diagnosis not present

## 2018-11-21 DIAGNOSIS — G479 Sleep disorder, unspecified: Secondary | ICD-10-CM

## 2018-11-21 DIAGNOSIS — Z87828 Personal history of other (healed) physical injury and trauma: Secondary | ICD-10-CM

## 2018-11-29 DIAGNOSIS — D225 Melanocytic nevi of trunk: Secondary | ICD-10-CM | POA: Diagnosis not present

## 2018-11-29 DIAGNOSIS — D2261 Melanocytic nevi of right upper limb, including shoulder: Secondary | ICD-10-CM | POA: Diagnosis not present

## 2018-11-29 DIAGNOSIS — Z85828 Personal history of other malignant neoplasm of skin: Secondary | ICD-10-CM | POA: Diagnosis not present

## 2018-11-29 DIAGNOSIS — C44212 Basal cell carcinoma of skin of right ear and external auricular canal: Secondary | ICD-10-CM | POA: Diagnosis not present

## 2018-11-29 DIAGNOSIS — D485 Neoplasm of uncertain behavior of skin: Secondary | ICD-10-CM | POA: Diagnosis not present

## 2018-11-29 DIAGNOSIS — L570 Actinic keratosis: Secondary | ICD-10-CM | POA: Diagnosis not present

## 2018-11-29 DIAGNOSIS — C4441 Basal cell carcinoma of skin of scalp and neck: Secondary | ICD-10-CM | POA: Diagnosis not present

## 2018-11-29 DIAGNOSIS — D2262 Melanocytic nevi of left upper limb, including shoulder: Secondary | ICD-10-CM | POA: Diagnosis not present

## 2018-11-29 DIAGNOSIS — D1801 Hemangioma of skin and subcutaneous tissue: Secondary | ICD-10-CM | POA: Diagnosis not present

## 2018-11-29 NOTE — Procedures (Signed)
   HISTORY: 78 year old male, presented with memory loss, vivid dreams  TECHNIQUE:  This is a routine 16 channel EEG recording with one channel devoted to a limited EKG recording.  It was performed during wakefulness, drowsiness and asleep.  Hyperventilation and photic stimulation were performed as activating procedures.  Upon maximum arousal, posterior dominant waking rhythm consistent of mildly dysrhythmic activity, with frequency of 7 Hz, there was frequent eye blinking artifact. Activities are symmetric over the bilateral posterior derivations and attenuated with eye opening.  Hyperventilation produced mild/moderate buildup with higher amplitude and the slower activities noted.  Photic stimulation did not alter the tracing.  During EEG recording, patient developed drowsiness and no deeper stage of sleep was entered.  During EEG recording, there was no epileptiform discharge noted.  EKG demonstrate sinus rhythm, with heart rate of 66 bpm  CONCLUSION: This is a mild abnormal EEG.  There is electrodiagnostic evidence of mild background slowing, there is no evidence of epileptiform discharge.  Marcial Pacas, M.D. Ph.D.  [facility] [place], [place] [address] Phone: [phone] Fax:      [phone]

## 2018-11-30 ENCOUNTER — Other Ambulatory Visit: Payer: Self-pay

## 2018-11-30 ENCOUNTER — Ambulatory Visit (INDEPENDENT_AMBULATORY_CARE_PROVIDER_SITE_OTHER): Payer: Medicare HMO | Admitting: Family Medicine

## 2018-11-30 ENCOUNTER — Encounter: Payer: Self-pay | Admitting: Family Medicine

## 2018-11-30 VITALS — BP 120/66 | HR 60 | Temp 98.2°F | Resp 16 | Ht 70.0 in | Wt 221.2 lb

## 2018-11-30 DIAGNOSIS — R413 Other amnesia: Secondary | ICD-10-CM

## 2018-11-30 DIAGNOSIS — G3184 Mild cognitive impairment, so stated: Secondary | ICD-10-CM | POA: Diagnosis not present

## 2018-11-30 DIAGNOSIS — Z87828 Personal history of other (healed) physical injury and trauma: Secondary | ICD-10-CM

## 2018-11-30 DIAGNOSIS — G479 Sleep disorder, unspecified: Secondary | ICD-10-CM | POA: Diagnosis not present

## 2018-11-30 DIAGNOSIS — E782 Mixed hyperlipidemia: Secondary | ICD-10-CM

## 2018-11-30 MED ORDER — DONEPEZIL HCL 10 MG PO TABS: 10.0000 mg | ORAL_TABLET | Freq: Every day | ORAL | 3 refills | Status: DC

## 2018-11-30 NOTE — Patient Instructions (Addendum)
Please return in 6 months for your annual complete physical; please come fasting. Please schedule a follow up appointment with Dr. Krista Blue to discuss further evaluation and management of your memory loss.   If you have any questions or concerns, please don't hesitate to send me a message via MyChart or call the office at [phone]. Thank you for visiting with [place] today! It's our pleasure caring for you.
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
## 2018-12-02 ENCOUNTER — Other Ambulatory Visit: Payer: Self-pay | Admitting: Family Medicine

## 2018-12-22 DIAGNOSIS — C61 Malignant neoplasm of prostate: Secondary | ICD-10-CM | POA: Diagnosis not present

## 2018-12-22 LAB — PSA: PSA: 5.51

## 2018-12-26 ENCOUNTER — Other Ambulatory Visit: Payer: Self-pay

## 2018-12-26 ENCOUNTER — Ambulatory Visit (INDEPENDENT_AMBULATORY_CARE_PROVIDER_SITE_OTHER): Payer: Medicare HMO | Admitting: Neurology

## 2018-12-26 ENCOUNTER — Encounter: Payer: Self-pay | Admitting: Neurology

## 2018-12-26 VITALS — BP 120/69 | HR 61 | Temp 98.0°F | Ht 70.0 in | Wt 220.0 lb

## 2018-12-26 DIAGNOSIS — G479 Sleep disorder, unspecified: Secondary | ICD-10-CM | POA: Diagnosis not present

## 2018-12-26 DIAGNOSIS — G3184 Mild cognitive impairment, so stated: Secondary | ICD-10-CM

## 2018-12-26 MED ORDER — DONEPEZIL HCL 10 MG PO TABS: 10.0000 mg | ORAL_TABLET | Freq: Every day | ORAL | 3 refills | Status: DC

## 2018-12-26 MED ORDER — MEMANTINE HCL 10 MG PO TABS: 10.0000 mg | ORAL_TABLET | Freq: Two times a day (BID) | ORAL | 4 refills | Status: DC
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
## 2018-12-27 ENCOUNTER — Telehealth: Payer: Self-pay | Admitting: Neurology

## 2018-12-27 NOTE — Telephone Encounter (Addendum)
He wanted to ask the purpose of memantine.  He understands that it works in combination with donepezil to slow the progression of memory loss.  He is aware that these two medications are often given together with no concern of interaction with one another.

## 2018-12-27 NOTE — Telephone Encounter (Signed)
Pt called in stating he has questions about a drug interaction

## 2018-12-29 DIAGNOSIS — C61 Malignant neoplasm of prostate: Secondary | ICD-10-CM | POA: Diagnosis not present

## 2019-01-12 ENCOUNTER — Other Ambulatory Visit: Payer: Self-pay | Admitting: Family Medicine

## 2019-01-19 ENCOUNTER — Ambulatory Visit: Payer: Self-pay

## 2019-01-26 DIAGNOSIS — C4401 Basal cell carcinoma of skin of lip: Secondary | ICD-10-CM | POA: Diagnosis not present

## 2019-01-26 DIAGNOSIS — Z85828 Personal history of other malignant neoplasm of skin: Secondary | ICD-10-CM | POA: Diagnosis not present

## 2019-02-02 DIAGNOSIS — C61 Malignant neoplasm of prostate: Secondary | ICD-10-CM | POA: Diagnosis not present

## 2019-02-08 DIAGNOSIS — Z85828 Personal history of other malignant neoplasm of skin: Secondary | ICD-10-CM | POA: Diagnosis not present

## 2019-02-08 DIAGNOSIS — C4401 Basal cell carcinoma of skin of lip: Secondary | ICD-10-CM | POA: Diagnosis not present

## 2019-02-09 DIAGNOSIS — C61 Malignant neoplasm of prostate: Secondary | ICD-10-CM | POA: Diagnosis not present

## 2019-02-15 ENCOUNTER — Other Ambulatory Visit (HOSPITAL_COMMUNITY): Payer: Self-pay | Admitting: Urology

## 2019-02-15 ENCOUNTER — Encounter: Payer: Self-pay | Admitting: *Deleted

## 2019-02-15 ENCOUNTER — Other Ambulatory Visit: Payer: Self-pay | Admitting: Urology

## 2019-02-15 DIAGNOSIS — C61 Malignant neoplasm of prostate: Secondary | ICD-10-CM

## 2019-02-25 ENCOUNTER — Other Ambulatory Visit: Payer: Self-pay | Admitting: Neurology

## 2019-02-28 ENCOUNTER — Other Ambulatory Visit: Payer: Self-pay

## 2019-02-28 ENCOUNTER — Encounter (HOSPITAL_COMMUNITY)
Admission: RE | Admit: 2019-02-28 | Discharge: 2019-02-28 | Disposition: A | Discharge status: Home / Self Care | Payer: Medicare HMO | Source: Ambulatory Visit | Attending: Urology | Admitting: Urology

## 2019-02-28 DIAGNOSIS — R972 Elevated prostate specific antigen [PSA]: Secondary | ICD-10-CM | POA: Diagnosis not present

## 2019-02-28 DIAGNOSIS — C61 Malignant neoplasm of prostate: Secondary | ICD-10-CM | POA: Diagnosis not present

## 2019-02-28 MED ORDER — TECHNETIUM TC 99M MEDRONATE IV KIT
18.2000 | PACK | Freq: Once | INTRAVENOUS | Status: AC | PRN
  Administered 2019-02-28: 11:00:00 18.2 via INTRAVENOUS

## 2019-03-06 ENCOUNTER — Other Ambulatory Visit: Payer: Self-pay | Admitting: Family Medicine

## 2019-03-07 ENCOUNTER — Ambulatory Visit
Admission: RE | Admit: 2019-03-07 | Discharge: 2019-03-07 | Disposition: A | Discharge status: Home / Self Care | Payer: Medicare HMO | Source: Ambulatory Visit | Attending: Radiation Oncology | Admitting: Radiation Oncology

## 2019-03-07 ENCOUNTER — Ambulatory Visit
Admission: RE | Admit: 2019-03-07 | Discharge: 2019-03-07 | Disposition: A | Discharge status: Home / Self Care | Payer: Medicare HMO | Source: Ambulatory Visit | Attending: Urology | Admitting: Urology

## 2019-03-07 ENCOUNTER — Encounter: Payer: Self-pay | Admitting: Urology

## 2019-03-07 DIAGNOSIS — C61 Malignant neoplasm of prostate: Secondary | ICD-10-CM | POA: Diagnosis not present

## 2019-03-07 DIAGNOSIS — Z8042 Family history of malignant neoplasm of prostate: Secondary | ICD-10-CM | POA: Diagnosis not present

## 2019-03-07 DIAGNOSIS — R972 Elevated prostate specific antigen [PSA]: Secondary | ICD-10-CM | POA: Diagnosis not present

## 2019-03-07 NOTE — Patient Instructions (Signed)
Coronavirus (COVID-19) Are you at risk?  Are you at risk for the Coronavirus (COVID-19)?  To be considered HIGH RISK for Coronavirus (COVID-19), you have to meet the following criteria:  . Traveled to [place], [place], [place], [place] or [place]; or in the [place] to [place], [place], [place], or [place]; and have fever, cough, and shortness of breath within the last 2 weeks of travel OR . Been in close contact with a person diagnosed with COVID-19 within the last 2 weeks and have fever, cough, and shortness of breath . IF YOU DO NOT MEET THESE CRITERIA, YOU ARE CONSIDERED LOW RISK FOR COVID-19.  What to do if you are HIGH RISK for COVID-19?  . If you are having a medical emergency, call 911. . Seek medical care right away. Before you go to a doctor's office, urgent care or emergency department, call ahead and tell them about your recent travel, contact with someone diagnosed with COVID-19, and your symptoms. You should receive instructions from your physician's office regarding next steps of care.  . When you arrive at healthcare provider, tell the healthcare staff immediately you have returned from visiting [place], [place], [place], [place] or [place]; or traveled in the [place] to [place], [place], [place], or [place]; in the last two weeks or you have been in close contact with a person diagnosed with COVID-19 in the last 2 weeks.   . Tell the health care staff about your symptoms: fever, cough and shortness of breath. . After you have been seen by a medical provider, you will be either: o Tested for (COVID-19) and discharged home on quarantine except to seek medical care if symptoms worsen, and asked to  - Stay home and avoid contact with others until you get your results (4-5 days)  - Avoid travel on public transportation if possible (such as bus, train, or airplane) or o Sent to the Emergency Department by EMS for evaluation, COVID-19 testing, and possible  admission depending on your condition and test results.  What to do if you are LOW RISK for COVID-19?  Reduce your risk of any infection by using the same precautions used for avoiding the common cold or flu:  . Wash your hands often with soap and warm water for at least 20 seconds.  If soap and water are not readily available, use an alcohol-based hand sanitizer with at least 60% alcohol.  . If coughing or sneezing, cover your mouth and nose by coughing or sneezing into the elbow areas of your shirt or coat, into a tissue or into your sleeve (not your hands). . Avoid shaking hands with others and consider head nods or verbal greetings only. . Avoid touching your eyes, nose, or mouth with unwashed hands.  . Avoid close contact with people who are sick. . Avoid places or events with large numbers of people in one location, like concerts or sporting events. . Carefully consider travel plans you have or are making. . If you are planning any travel outside or inside the [place], visit the CDC's Travelers' Health webpage for the latest health notices. . If you have some symptoms but not all symptoms, continue to monitor at home and seek medical attention if your symptoms worsen. . If you are having a medical emergency, call 911.   ADDITIONAL HEALTHCARE OPTIONS FOR PATIENTS  [facility] / e-Visit: [email or web address]         [facility]: [phone]  [facility]: [phone]                   [facility]: [phone]   

## 2019-03-07 NOTE — Progress Notes (Signed)
Radiation Oncology         [phone] ________________________________  Follow up New visit - Conducted via telephone due to current COVID-19 concerns for limiting patient exposure  Name: Caleb Gibson MRN: [id number]  Date: 03/07/2019  DOB: Sep 13, 1940  CC:Caleb Arnt, MD  Davis Gourd*   REFERRING PHYSICIAN: Davis Gourd*  DIAGNOSIS: 78 y.o. gentleman with Stage T1c adenocarcinoma of the prostate with Gleason Score of 3+4, and PSA of 5.5    ICD-10-CM   1. Malignant neoplasm of prostate ([place])  C61     HISTORY OF PRESENT ILLNESS: Caleb Gibson is a 78 y.o. male with a diagnosis of prostate cancer. In summary, this is a patient whom we saw in consultation on 11/22/2017 for a newly diagnosed stage T1c, Gleason 3+3 prostate cancer with a PSA of 5.5. At that time, he opted to pursue active surveillance. Since that time, he has been followed closely by Dr. Lovena Neighbours. With repeat PSA levels which have remained stable at 4.85 in 06/2018 and 5.51 in 12/2018.  He proceeded to surveillance biopsy on 02/02/2019.  The prostate volume measured 68 cc.  Out of 12 core biopsies, 6 were positive.  The maximum Gleason score was 3+4 and this was seen in right base lateral and right apex lateral. Gleason 3+3 was also seen in right mid lateral (small focus), right mid, right base (small focus), and left apex lateral.  He underwent bone scan on 02/28/2019, which showed subtle uptake over a left upper posterior rib without CT correlate. On CT A/P that same day, there was a 2cm right hepatic lobe lesion felt likely to be a hemangioma. There was no lymphadenopathy or findings suggestive of osseous metastasis.  The patient reviewed the biopsy results with his urologist and he has kindly been referred back to [place] today for further discussion of potential radiation treatment options.  PREVIOUS RADIATION THERAPY: No  PAST MEDICAL HISTORY:  Past Medical History:  Diagnosis Date  . Anxiety   . Basal  cell carcinoma of skin   . Claustrophobia   . High cholesterol   . Memory loss   . Night terrors   . Prostate cancer ([place])   . Sleep walking   . Tubular adenoma of colon 06/15/2017   Colonoscopy 08/2016; rec repeat in 5 years.       PAST SURGICAL HISTORY: Past Surgical History:  Procedure Laterality Date  . APPENDECTOMY    . DENTAL SURGERY     Had a tooth pulled in 01/2017  . KNEE ARTHROSCOPY WITH MENISCAL REPAIR Right   . MOHS SURGERY    . RHINOPLASTY    . TONSILLECTOMY AND ADENOIDECTOMY      FAMILY HISTORY:  Family History  Problem Relation Age of Onset  . Alcohol abuse Mother   . Melanoma Mother   . Lymphoma Mother   . Prostate cancer Father   . Diabetes Father   . Glaucoma Father   . Heart disease Maternal Grandmother   . Alcohol abuse Maternal Aunt   . Cirrhosis Maternal Aunt     SOCIAL HISTORY:  Social History   Socioeconomic History  . Marital status: Married    Spouse name: Not on file  . Number of children: 3  . Years of education: 2 years college  . Highest education level: Not on file  Occupational History    Comment: retired  Scientific laboratory technician  . Financial resource strain: Not on file  . Food insecurity    Worry: Not on  file    Inability: Not on file  . Transportation needs    Medical: Not on file    Non-medical: Not on file  Tobacco Use  . Smoking status: Former Smoker    Types: Cigarettes    Quit date: 1996    Years since quitting: 24.7  . Smokeless tobacco: Never Used  Substance and Sexual Activity  . Alcohol use: Yes    Comment: 2-6 glasses of wine per week  . Drug use: No  . Sexual activity: Yes  Lifestyle  . Physical activity    Days per week: Not on file    Minutes per session: Not on file  . Stress: Not on file  Relationships  . Social Herbalist on phone: Not on file    Gets together: Not on file    Attends religious service: Not on file    Active member of club or organization: Not on file    Attends meetings of  clubs or organizations: Not on file    Relationship status: Not on file  . Intimate partner violence    Fear of current or ex partner: Not on file    Emotionally abused: Not on file    Physically abused: Not on file    Forced sexual activity: Not on file  Other Topics Concern  . Not on file  Social History Narrative   Lives at home with his wife.   One 12oz can of Diet Coke per day.   Right-handed.    ALLERGIES: Viagra [sildenafil citrate]  MEDICATIONS:  Current Outpatient Medications  Medication Sig Dispense Refill  . Amino Acids (L-CARNITINE PO) Take 250 mg by mouth daily.    Marland Kitchen CALCIUM CITRATE PO Take 600 mg by mouth daily.     . cholecalciferol (VITAMIN D) 1000 units tablet Take 1,000 Units by mouth daily.    . citalopram (CELEXA) 10 MG tablet TAKE 1 TABLET EVERY DAY 90 tablet 1  . clonazePAM (KLONOPIN) 0.5 MG tablet TAKE 1 TABLET BY MOUTH AT BEDTIME AS NEEDED FOR ANXIETY 30 tablet 0  . donepezil (ARICEPT) 10 MG tablet TAKE 1 TABLET BY MOUTH AT BEDTIME 90 tablet 1  . GARCINIA CAMBOGIA-CHROMIUM PO Take 1 tablet by mouth daily.    Marland Kitchen glucosamine-chondroitin 500-400 MG tablet Take 1 tablet by mouth daily.     . Lycopene 10 MG CAPS Take 1 capsule by mouth daily.     . memantine (NAMENDA) 10 MG tablet Take 1 tablet (10 mg total) by mouth 2 (two) times daily. 180 tablet 4  . Multiple Vitamins-Minerals (CENTRUM SILVER) tablet Take 1 tablet by mouth daily.    . Multiple Vitamins-Minerals (EYE VITAMINS PO)     . Omega-3 Fatty Acids (FISH OIL) 1200 MG CAPS Take by mouth.    Marland Kitchen OVER THE COUNTER MEDICATION Take 120 mg by mouth daily. Ginko Biloba    . rosuvastatin (CRESTOR) 5 MG tablet Take 1/2 (one-half) tablet by mouth once daily 45 tablet 3  . Saw Palmetto, Serenoa repens, 450 MG CAPS Take 1 capsule by mouth daily.     . tadalafil (CIALIS) 5 MG tablet TAKE 1 TO 4 TABLETS BY MOUTH PRIOR TO INTERCOURSE  0  . vitamin B-12 (CYANOCOBALAMIN) 1000 MCG tablet Take 1,000 mcg by mouth daily.    .  vitamin C (ASCORBIC ACID) 500 MG tablet Take 500 mg by mouth daily.      No current facility-administered medications for this encounter.     REVIEW  OF SYSTEMS:  On review of systems, the patient reports that he is doing well overall. He has mild urinary urgency, frequency and intermittency but feels that he empties his bladder well on voiding.  He denies gross hematuria or dysuria and reports nocturia x2 per night which is tolerable. He denies any chest pain, shortness of breath, cough, fevers, chills, night sweats, unintended weight changes. He denies any bowel disturbances, and denies abdominal pain, nausea or vomiting. He denies any new musculoskeletal or joint aches or pains. A complete review of systems is obtained and is otherwise negative.    PHYSICAL EXAM:  Wt Readings from Last 3 Encounters:  12/26/18 220 lb (99.8 kg)  11/30/18 221 lb 3.2 oz (100.3 kg)  08/04/18 229 lb (103.9 kg)   Temp Readings from Last 3 Encounters:  12/26/18 98 F (36.7 C)  11/30/18 98.2 F (36.8 C) (Oral)  06/16/18 98.7 F (37.1 C) (Oral)   BP Readings from Last 3 Encounters:  12/26/18 120/69  11/30/18 120/66  08/04/18 131/80   Pulse Readings from Last 3 Encounters:  12/26/18 61  11/30/18 60  08/04/18 62   Unable to assess due to telephone follow up visit format.  KPS = 100  100 - Normal; no complaints; no evidence of disease. 90   - Able to carry on normal activity; minor signs or symptoms of disease. 80   - Normal activity with effort; some signs or symptoms of disease. 73   - Cares for self; unable to carry on normal activity or to do active work. 60   - Requires occasional assistance, but is able to care for most of his personal needs. 50   - Requires considerable assistance and frequent medical care. 49   - Disabled; requires special care and assistance. 40   - Severely disabled; hospital admission is indicated although death not imminent. 17   - Very sick; hospital admission necessary;  active supportive treatment necessary. 10   - Moribund; fatal processes progressing rapidly. 0     - Dead  Karnofsky DA, Abelmann [name], Craver LS and Burchenal [facility] [phone]) The use of the nitrogen mustards in the palliative treatment of carcinoma: with particular reference to bronchogenic carcinoma Cancer 1 634-56  LABORATORY DATA:  Lab Results  Component Value Date   WBC 4.0 05/10/2018   HGB 14.1 05/10/2018   HCT 41.3 05/10/2018   MCV 94.2 05/10/2018   PLT 202.0 05/10/2018   Lab Results  Component Value Date   NA 138 05/10/2018   K 4.1 05/10/2018   CL 103 05/10/2018   CO2 29 05/10/2018   Lab Results  Component Value Date   ALT 14 05/10/2018   AST 14 05/10/2018   ALKPHOS 60 05/10/2018   BILITOT 0.7 05/10/2018     RADIOGRAPHY: Nm Bone Scan Whole Body  Result Date: 02/28/2019 CLINICAL DATA:  History of prostate cancer with elevated PSA levels post biopsy 02/02/2019 and 10/11/2017. No bone pain. No recent fall or trauma. EXAM: NUCLEAR MEDICINE WHOLE BODY BONE SCAN TECHNIQUE: Whole body anterior and posterior images were obtained approximately 3 hours after intravenous injection of radiopharmaceutical. RADIOPHARMACEUTICALS:  18.2 mCi Technetium-17m MDP IV COMPARISON:  CT 02/28/2019. FINDINGS: Focal uptake over the left [facility] joint compatible with known degenerative changes. Subtle uptake over the a left upper posterior rib without CT correlate. Remainder of the exam is within normal. IMPRESSION: Subtle uptake over a left upper posterior rib without CT correlate. Findings indeterminate as metastatic disease is possible and recommend attention  on follow-up. No other evidence of metastatic disease. Electronically Signed   By: Marin Olp M.D.   On: 02/28/2019 21:44      IMPRESSION/PLAN:  This visit was conducted via telephone to spare the patient unnecessary potential exposure in the healthcare setting during the current COVID-19 pandemic.  1. 78 y.o. gentleman with Stage T1c adenocarcinoma  of the prostate with Gleason Score of 3+4, and PSA of 5.5.  We discussed the patient's workup and reviewed the nature of prostate cancer in this setting. The patient's recent biopsy has moved him into the favorable intermediate risk group. We reviewed the potential treatment options including brachytherapy or 5.5 weeks of external radiation. He is not an ideal candidate for brachytherapy given the large volume of his prostate at 68 gm.  We discussed the potential increased risks associated with brachytherapy in men with prostate volume above 60 gm including but not limited to post-procedural urinary retention. The recommendation is to proceed with a 5.5 week course of daily prostate IMRT. We reviewed and outlined the risks, benefits, short and long-term effects associated with radiotherapy in this setting. We also discussed the role of SpaceOAR gel in reducing the rectal toxicity associated with radiotherapy. He was encouraged to ask questions that were answered to his stated satisfaction.  At the end of our conversation, the patient is interested in moving forward with a 5.5 week course of daily radiotherapy. He has a scheduled follow up visit with Dr. Lovena Neighbours on 03/13/19. We will share our discussion with Dr. Lovena Neighbours and move forward with coordinating for fiducial markers and SpaceOAR gel placement, first available, prior to CT SIM in preparation to begin his radiation treatments in the near future. He appears to have a good understanding of his disease and our recommendations which are of curative intent. He is comfortable with and in agreement with the stated plan.  Given current concerns for patient exposure during the COVID-19 pandemic, this encounter was conducted via telephone. The patient was notified in advance and was offered a [place] meeting to allow for face to face communication but unfortunately reported that he did not have the appropriate resources/technology to support such a visit and instead  preferred to proceed with telephone follow up new visit. The patient has given verbal consent for this type of encounter. The time spent during this encounter was 45 minutes. The attendants for this meeting include Geoffry Bannister PA-C, patient Caleb Gibson and his wife. During the encounter,  Clotilde Loth PA-C was located at [facility] Department.  Patient Caleb Gibson and his wife were located at home.   Nicholos Johns, PA-C    Tyler Pita, MD  [facility] Direct Dial: [phone]  Fax: [phone] [email or web address]  Skype  LinkedIn   This document serves as a record of services personally performed by Tyler Pita, MD and Freeman Caldron, PA-C. It was created on their behalf by Wilburn Mylar, a trained medical scribe. The creation of this record is based on the scribe's personal observations and the provider's statements to them. This document has been checked and approved by the attending provider.

## 2019-03-13 DIAGNOSIS — C61 Malignant neoplasm of prostate: Secondary | ICD-10-CM | POA: Diagnosis not present

## 2019-03-16 ENCOUNTER — Encounter: Payer: Self-pay | Admitting: Urology

## 2019-03-16 ENCOUNTER — Encounter: Payer: Self-pay | Admitting: Medical Oncology

## 2019-03-16 ENCOUNTER — Other Ambulatory Visit: Payer: Self-pay | Admitting: Urology

## 2019-03-16 DIAGNOSIS — C61 Malignant neoplasm of prostate: Secondary | ICD-10-CM

## 2019-03-16 NOTE — Progress Notes (Signed)
Patient will have fiducial markers and SpaceOAR placed in the office with Dr. Lovena Neighbours 04/20/19 and we will coordinate CT SIM shortly thereafter.  Caleb Gibson, MMS, PA-C [place] [facility] [place]: [phone]  Fax: [phone]

## 2019-03-24 ENCOUNTER — Telehealth: Payer: Self-pay | Admitting: *Deleted

## 2019-03-24 NOTE — Telephone Encounter (Signed)
CALLED PATIENT TO INFORM THAT [facility] DOESN'T HAVE AN EARLIER DATE FOR PLACEMENT OF HIS FID. MARKERS AND SPACE OAR, SPOKE WITH PATIENT AND HE VERIFIED UNDERSTANDING THIS

## 2019-04-11 ENCOUNTER — Other Ambulatory Visit: Payer: Self-pay | Admitting: *Deleted

## 2019-04-11 MED ORDER — ROSUVASTATIN CALCIUM 5 MG PO TABS: ORAL_TABLET | ORAL | 3 refills | Status: DC

## 2019-04-20 ENCOUNTER — Telehealth: Payer: Self-pay | Admitting: *Deleted

## 2019-04-20 DIAGNOSIS — C61 Malignant neoplasm of prostate: Secondary | ICD-10-CM | POA: Diagnosis not present

## 2019-04-20 NOTE — Telephone Encounter (Signed)
Called patient to inform of sim appt. For 04-25-19 - arrival time- 10:45 am @ Dr. Johny Shears Office and his lab also on 04-25-19 after sim @ 12:15 pm, and his MRI for 04-26-19- arrival time- 2:30 pm @ [facility], no restrictions to test, spoke with patient's wife and she is aware of these appts.

## 2019-04-24 ENCOUNTER — Telehealth: Payer: Self-pay | Admitting: *Deleted

## 2019-04-24 ENCOUNTER — Other Ambulatory Visit: Payer: Self-pay | Admitting: Urology

## 2019-04-24 DIAGNOSIS — C61 Malignant neoplasm of prostate: Secondary | ICD-10-CM

## 2019-04-24 MED ORDER — LORAZEPAM 1 MG PO TABS: 1.0000 mg | ORAL_TABLET | ORAL | 0 refills | Status: DC | PRN

## 2019-04-24 NOTE — Telephone Encounter (Signed)
CALLED PATIENT TO REMIND OF APPTS. FOR 04-25-19 , SPOKE WITH PATIENT AND THEY ARE AWARE OF THESE APPTS.

## 2019-04-25 ENCOUNTER — Telehealth: Payer: Self-pay | Admitting: *Deleted

## 2019-04-25 ENCOUNTER — Ambulatory Visit: Payer: Medicare HMO

## 2019-04-25 ENCOUNTER — Ambulatory Visit
Admission: RE | Admit: 2019-04-25 | Discharge: 2019-04-25 | Disposition: A | Discharge status: Home / Self Care | Payer: Medicare HMO | Source: Ambulatory Visit | Attending: Radiation Oncology | Admitting: Radiation Oncology

## 2019-04-25 ENCOUNTER — Encounter: Payer: Self-pay | Admitting: Medical Oncology

## 2019-04-25 ENCOUNTER — Other Ambulatory Visit: Payer: Self-pay

## 2019-04-25 DIAGNOSIS — Z510 Encounter for antineoplastic radiation therapy: Secondary | ICD-10-CM | POA: Diagnosis not present

## 2019-04-25 DIAGNOSIS — C61 Malignant neoplasm of prostate: Secondary | ICD-10-CM | POA: Diagnosis not present

## 2019-04-25 NOTE — Telephone Encounter (Signed)
Called patient to inform that script is ready for pickup at the Pharmacy and that his wife can come today for his sim due to memory issues, spoke with Caleb Gibson and he is aware of this

## 2019-04-25 NOTE — Progress Notes (Signed)
  Radiation Oncology         [phone] ________________________________  Name: Caleb Gibson MRN: [id number]  Date: 04/25/2019  DOB: 1940/06/22  SIMULATION AND TREATMENT PLANNING NOTE    ICD-10-CM   1. Malignant neoplasm of prostate ([place])  C61     DIAGNOSIS:  78 y.o. gentleman with low risk, Stage T1c adenocarcinoma of the prostate with Gleason Score of 3+3=6, and PSA of 5.5  NARRATIVE:  The patient was brought to the [facility].  Identity was confirmed.  All relevant records and images related to the planned course of therapy were reviewed.  The patient freely provided informed written consent to proceed with treatment after reviewing the details related to the planned course of therapy. The consent form was witnessed and verified by the simulation staff.  Then, the patient was set-up in a stable reproducible supine position for radiation therapy.  A vacuum lock pillow device was custom fabricated to position his legs in a reproducible immobilized position.  Then, I performed a urethrogram under sterile conditions to identify the prostatic apex.  CT images were obtained.  Surface markings were placed.  The CT images were loaded into the planning software.  Then the prostate target and avoidance structures including the rectum, bladder, bowel and hips were contoured.  Treatment planning then occurred.  The radiation prescription was entered and confirmed.  A total of one complex treatment devices was fabricated. I have requested : Intensity Modulated Radiotherapy (IMRT) is medically necessary for this case for the following reason:  Rectal sparing.Marland Kitchen  PLAN:  The patient will receive 70 Gy in 28 fractions.  ________________________________  Sheral Apley Tammi Klippel, M.D.

## 2019-04-26 ENCOUNTER — Ambulatory Visit (HOSPITAL_COMMUNITY)
Admission: RE | Admit: 2019-04-26 | Discharge: 2019-04-26 | Disposition: A | Discharge status: Home / Self Care | Payer: Medicare HMO | Source: Ambulatory Visit | Attending: Urology | Admitting: Urology

## 2019-04-26 DIAGNOSIS — C61 Malignant neoplasm of prostate: Secondary | ICD-10-CM

## 2019-05-01 ENCOUNTER — Ambulatory Visit
Admission: RE | Admit: 2019-05-01 | Discharge: 2019-05-01 | Disposition: A | Discharge status: Home / Self Care | Payer: Medicare HMO | Source: Ambulatory Visit | Attending: Radiation Oncology | Admitting: Radiation Oncology

## 2019-05-01 ENCOUNTER — Other Ambulatory Visit: Payer: Self-pay

## 2019-05-01 DIAGNOSIS — Z510 Encounter for antineoplastic radiation therapy: Secondary | ICD-10-CM | POA: Diagnosis not present

## 2019-05-01 DIAGNOSIS — C61 Malignant neoplasm of prostate: Secondary | ICD-10-CM | POA: Diagnosis not present

## 2019-05-02 ENCOUNTER — Ambulatory Visit
Admission: RE | Admit: 2019-05-02 | Discharge: 2019-05-02 | Disposition: A | Discharge status: Home / Self Care | Payer: Medicare HMO | Source: Ambulatory Visit | Attending: Radiation Oncology | Admitting: Radiation Oncology

## 2019-05-02 ENCOUNTER — Other Ambulatory Visit: Payer: Self-pay

## 2019-05-02 DIAGNOSIS — C61 Malignant neoplasm of prostate: Secondary | ICD-10-CM | POA: Diagnosis not present

## 2019-05-02 DIAGNOSIS — Z510 Encounter for antineoplastic radiation therapy: Secondary | ICD-10-CM | POA: Diagnosis not present

## 2019-05-03 ENCOUNTER — Other Ambulatory Visit: Payer: Self-pay

## 2019-05-03 ENCOUNTER — Ambulatory Visit
Admission: RE | Admit: 2019-05-03 | Discharge: 2019-05-03 | Disposition: A | Discharge status: Home / Self Care | Payer: Medicare HMO | Source: Ambulatory Visit | Attending: Radiation Oncology | Admitting: Radiation Oncology

## 2019-05-03 DIAGNOSIS — Z510 Encounter for antineoplastic radiation therapy: Secondary | ICD-10-CM | POA: Diagnosis not present

## 2019-05-03 DIAGNOSIS — C61 Malignant neoplasm of prostate: Secondary | ICD-10-CM | POA: Diagnosis not present

## 2019-05-08 ENCOUNTER — Ambulatory Visit
Admission: RE | Admit: 2019-05-08 | Discharge: 2019-05-08 | Disposition: A | Discharge status: Home / Self Care | Payer: Medicare HMO | Source: Ambulatory Visit | Attending: Radiation Oncology | Admitting: Radiation Oncology

## 2019-05-08 ENCOUNTER — Other Ambulatory Visit: Payer: Self-pay

## 2019-05-08 DIAGNOSIS — C61 Malignant neoplasm of prostate: Secondary | ICD-10-CM | POA: Diagnosis not present

## 2019-05-08 DIAGNOSIS — Z510 Encounter for antineoplastic radiation therapy: Secondary | ICD-10-CM | POA: Diagnosis not present

## 2019-05-09 ENCOUNTER — Other Ambulatory Visit: Payer: Self-pay

## 2019-05-09 ENCOUNTER — Ambulatory Visit
Admission: RE | Admit: 2019-05-09 | Discharge: 2019-05-09 | Disposition: A | Discharge status: Home / Self Care | Payer: Medicare HMO | Source: Ambulatory Visit | Attending: Radiation Oncology | Admitting: Radiation Oncology

## 2019-05-09 DIAGNOSIS — Z510 Encounter for antineoplastic radiation therapy: Secondary | ICD-10-CM | POA: Insufficient documentation

## 2019-05-09 DIAGNOSIS — C61 Malignant neoplasm of prostate: Secondary | ICD-10-CM | POA: Insufficient documentation

## 2019-05-10 ENCOUNTER — Ambulatory Visit
Admission: RE | Admit: 2019-05-10 | Discharge: 2019-05-10 | Disposition: A | Discharge status: Home / Self Care | Payer: Medicare HMO | Source: Ambulatory Visit | Attending: Radiation Oncology | Admitting: Radiation Oncology

## 2019-05-10 ENCOUNTER — Other Ambulatory Visit: Payer: Self-pay

## 2019-05-10 DIAGNOSIS — Z510 Encounter for antineoplastic radiation therapy: Secondary | ICD-10-CM | POA: Diagnosis not present

## 2019-05-10 DIAGNOSIS — C61 Malignant neoplasm of prostate: Secondary | ICD-10-CM | POA: Diagnosis not present

## 2019-05-11 ENCOUNTER — Ambulatory Visit
Admission: RE | Admit: 2019-05-11 | Discharge: 2019-05-11 | Disposition: A | Discharge status: Home / Self Care | Payer: Medicare HMO | Source: Ambulatory Visit | Attending: Radiation Oncology | Admitting: Radiation Oncology

## 2019-05-11 ENCOUNTER — Other Ambulatory Visit: Payer: Self-pay

## 2019-05-11 DIAGNOSIS — C61 Malignant neoplasm of prostate: Secondary | ICD-10-CM | POA: Diagnosis not present

## 2019-05-11 DIAGNOSIS — Z510 Encounter for antineoplastic radiation therapy: Secondary | ICD-10-CM | POA: Diagnosis not present

## 2019-05-12 ENCOUNTER — Other Ambulatory Visit: Payer: Self-pay

## 2019-05-12 ENCOUNTER — Encounter: Payer: Self-pay | Admitting: Medical Oncology

## 2019-05-12 ENCOUNTER — Ambulatory Visit
Admission: RE | Admit: 2019-05-12 | Discharge: 2019-05-12 | Disposition: A | Discharge status: Home / Self Care | Payer: Medicare HMO | Source: Ambulatory Visit | Attending: Radiation Oncology | Admitting: Radiation Oncology

## 2019-05-12 DIAGNOSIS — C61 Malignant neoplasm of prostate: Secondary | ICD-10-CM | POA: Diagnosis not present

## 2019-05-12 DIAGNOSIS — Z510 Encounter for antineoplastic radiation therapy: Secondary | ICD-10-CM | POA: Diagnosis not present

## 2019-05-15 ENCOUNTER — Other Ambulatory Visit: Payer: Self-pay

## 2019-05-15 ENCOUNTER — Ambulatory Visit
Admission: RE | Admit: 2019-05-15 | Discharge: 2019-05-15 | Disposition: A | Discharge status: Home / Self Care | Payer: Medicare HMO | Source: Ambulatory Visit | Attending: Radiation Oncology | Admitting: Radiation Oncology

## 2019-05-15 DIAGNOSIS — C61 Malignant neoplasm of prostate: Secondary | ICD-10-CM | POA: Diagnosis not present

## 2019-05-15 DIAGNOSIS — Z510 Encounter for antineoplastic radiation therapy: Secondary | ICD-10-CM | POA: Diagnosis not present

## 2019-05-16 ENCOUNTER — Other Ambulatory Visit: Payer: Self-pay

## 2019-05-16 ENCOUNTER — Ambulatory Visit
Admission: RE | Admit: 2019-05-16 | Discharge: 2019-05-16 | Disposition: A | Discharge status: Home / Self Care | Payer: Medicare HMO | Source: Ambulatory Visit | Attending: Radiation Oncology | Admitting: Radiation Oncology

## 2019-05-16 DIAGNOSIS — C61 Malignant neoplasm of prostate: Secondary | ICD-10-CM | POA: Diagnosis not present

## 2019-05-16 DIAGNOSIS — Z510 Encounter for antineoplastic radiation therapy: Secondary | ICD-10-CM | POA: Diagnosis not present

## 2019-05-17 ENCOUNTER — Ambulatory Visit
Admission: RE | Admit: 2019-05-17 | Discharge: 2019-05-17 | Disposition: A | Discharge status: Home / Self Care | Payer: Medicare HMO | Source: Ambulatory Visit | Attending: Radiation Oncology | Admitting: Radiation Oncology

## 2019-05-17 ENCOUNTER — Other Ambulatory Visit: Payer: Self-pay

## 2019-05-17 DIAGNOSIS — C61 Malignant neoplasm of prostate: Secondary | ICD-10-CM | POA: Diagnosis not present

## 2019-05-17 DIAGNOSIS — Z510 Encounter for antineoplastic radiation therapy: Secondary | ICD-10-CM | POA: Diagnosis not present

## 2019-05-18 ENCOUNTER — Ambulatory Visit
Admission: RE | Admit: 2019-05-18 | Discharge: 2019-05-18 | Disposition: A | Discharge status: Home / Self Care | Payer: Medicare HMO | Source: Ambulatory Visit | Attending: Radiation Oncology | Admitting: Radiation Oncology

## 2019-05-18 ENCOUNTER — Other Ambulatory Visit: Payer: Self-pay

## 2019-05-18 DIAGNOSIS — C61 Malignant neoplasm of prostate: Secondary | ICD-10-CM | POA: Diagnosis not present

## 2019-05-18 DIAGNOSIS — Z510 Encounter for antineoplastic radiation therapy: Secondary | ICD-10-CM | POA: Diagnosis not present

## 2019-05-19 ENCOUNTER — Ambulatory Visit
Admission: RE | Admit: 2019-05-19 | Discharge: 2019-05-19 | Disposition: A | Discharge status: Home / Self Care | Payer: Medicare HMO | Source: Ambulatory Visit | Attending: Radiation Oncology | Admitting: Radiation Oncology

## 2019-05-19 ENCOUNTER — Other Ambulatory Visit: Payer: Self-pay

## 2019-05-19 DIAGNOSIS — Z510 Encounter for antineoplastic radiation therapy: Secondary | ICD-10-CM | POA: Diagnosis not present

## 2019-05-19 DIAGNOSIS — C61 Malignant neoplasm of prostate: Secondary | ICD-10-CM | POA: Diagnosis not present

## 2019-05-22 ENCOUNTER — Ambulatory Visit
Admission: RE | Admit: 2019-05-22 | Discharge: 2019-05-22 | Disposition: A | Discharge status: Home / Self Care | Payer: Medicare HMO | Source: Ambulatory Visit | Attending: Radiation Oncology | Admitting: Radiation Oncology

## 2019-05-22 ENCOUNTER — Other Ambulatory Visit: Payer: Self-pay

## 2019-05-22 DIAGNOSIS — C61 Malignant neoplasm of prostate: Secondary | ICD-10-CM | POA: Diagnosis not present

## 2019-05-22 DIAGNOSIS — Z510 Encounter for antineoplastic radiation therapy: Secondary | ICD-10-CM | POA: Diagnosis not present

## 2019-05-23 ENCOUNTER — Ambulatory Visit
Admission: RE | Admit: 2019-05-23 | Discharge: 2019-05-23 | Disposition: A | Discharge status: Home / Self Care | Payer: Medicare HMO | Source: Ambulatory Visit | Attending: Radiation Oncology | Admitting: Radiation Oncology

## 2019-05-23 ENCOUNTER — Other Ambulatory Visit: Payer: Self-pay

## 2019-05-23 DIAGNOSIS — C61 Malignant neoplasm of prostate: Secondary | ICD-10-CM | POA: Diagnosis not present

## 2019-05-23 DIAGNOSIS — Z510 Encounter for antineoplastic radiation therapy: Secondary | ICD-10-CM | POA: Diagnosis not present

## 2019-05-24 ENCOUNTER — Other Ambulatory Visit: Payer: Self-pay

## 2019-05-24 ENCOUNTER — Ambulatory Visit
Admission: RE | Admit: 2019-05-24 | Discharge: 2019-05-24 | Disposition: A | Discharge status: Home / Self Care | Payer: Medicare HMO | Source: Ambulatory Visit | Attending: Radiation Oncology | Admitting: Radiation Oncology

## 2019-05-24 DIAGNOSIS — C61 Malignant neoplasm of prostate: Secondary | ICD-10-CM | POA: Diagnosis not present

## 2019-05-24 DIAGNOSIS — Z510 Encounter for antineoplastic radiation therapy: Secondary | ICD-10-CM | POA: Diagnosis not present

## 2019-05-25 ENCOUNTER — Ambulatory Visit
Admission: RE | Admit: 2019-05-25 | Discharge: 2019-05-25 | Disposition: A | Discharge status: Home / Self Care | Payer: Medicare HMO | Source: Ambulatory Visit | Attending: Radiation Oncology | Admitting: Radiation Oncology

## 2019-05-25 ENCOUNTER — Other Ambulatory Visit: Payer: Self-pay

## 2019-05-25 DIAGNOSIS — Z510 Encounter for antineoplastic radiation therapy: Secondary | ICD-10-CM | POA: Diagnosis not present

## 2019-05-25 DIAGNOSIS — C61 Malignant neoplasm of prostate: Secondary | ICD-10-CM | POA: Diagnosis not present

## 2019-05-26 ENCOUNTER — Ambulatory Visit
Admission: RE | Admit: 2019-05-26 | Discharge: 2019-05-26 | Disposition: A | Discharge status: Home / Self Care | Payer: Medicare HMO | Source: Ambulatory Visit | Attending: Radiation Oncology | Admitting: Radiation Oncology

## 2019-05-26 ENCOUNTER — Other Ambulatory Visit: Payer: Self-pay

## 2019-05-26 DIAGNOSIS — Z510 Encounter for antineoplastic radiation therapy: Secondary | ICD-10-CM | POA: Diagnosis not present

## 2019-05-26 DIAGNOSIS — C61 Malignant neoplasm of prostate: Secondary | ICD-10-CM | POA: Diagnosis not present

## 2019-05-29 ENCOUNTER — Other Ambulatory Visit: Payer: Self-pay

## 2019-05-29 ENCOUNTER — Ambulatory Visit
Admission: RE | Admit: 2019-05-29 | Discharge: 2019-05-29 | Disposition: A | Discharge status: Home / Self Care | Payer: Medicare HMO | Source: Ambulatory Visit | Attending: Radiation Oncology | Admitting: Radiation Oncology

## 2019-05-29 DIAGNOSIS — Z510 Encounter for antineoplastic radiation therapy: Secondary | ICD-10-CM | POA: Diagnosis not present

## 2019-05-29 DIAGNOSIS — C61 Malignant neoplasm of prostate: Secondary | ICD-10-CM | POA: Diagnosis not present

## 2019-05-30 ENCOUNTER — Ambulatory Visit
Admission: RE | Admit: 2019-05-30 | Discharge: 2019-05-30 | Disposition: A | Discharge status: Home / Self Care | Payer: Medicare HMO | Source: Ambulatory Visit | Attending: Radiation Oncology | Admitting: Radiation Oncology

## 2019-05-30 ENCOUNTER — Ambulatory Visit: Payer: Medicare HMO | Admitting: Family Medicine

## 2019-05-30 ENCOUNTER — Other Ambulatory Visit: Payer: Self-pay

## 2019-05-30 DIAGNOSIS — Z510 Encounter for antineoplastic radiation therapy: Secondary | ICD-10-CM | POA: Diagnosis not present

## 2019-05-30 DIAGNOSIS — C61 Malignant neoplasm of prostate: Secondary | ICD-10-CM | POA: Diagnosis not present

## 2019-05-31 ENCOUNTER — Ambulatory Visit: Payer: Medicare HMO | Admitting: Family Medicine

## 2019-05-31 ENCOUNTER — Ambulatory Visit
Admission: RE | Admit: 2019-05-31 | Discharge: 2019-05-31 | Disposition: A | Discharge status: Home / Self Care | Payer: Medicare HMO | Source: Ambulatory Visit | Attending: Radiation Oncology | Admitting: Radiation Oncology

## 2019-05-31 ENCOUNTER — Other Ambulatory Visit: Payer: Self-pay

## 2019-05-31 DIAGNOSIS — Z510 Encounter for antineoplastic radiation therapy: Secondary | ICD-10-CM | POA: Diagnosis not present

## 2019-05-31 DIAGNOSIS — C61 Malignant neoplasm of prostate: Secondary | ICD-10-CM | POA: Diagnosis not present

## 2019-06-01 ENCOUNTER — Ambulatory Visit
Admission: RE | Admit: 2019-06-01 | Discharge: 2019-06-01 | Disposition: A | Discharge status: Home / Self Care | Payer: Medicare HMO | Source: Ambulatory Visit | Attending: Radiation Oncology | Admitting: Radiation Oncology

## 2019-06-01 ENCOUNTER — Other Ambulatory Visit: Payer: Self-pay

## 2019-06-01 DIAGNOSIS — C61 Malignant neoplasm of prostate: Secondary | ICD-10-CM | POA: Diagnosis not present

## 2019-06-01 DIAGNOSIS — Z510 Encounter for antineoplastic radiation therapy: Secondary | ICD-10-CM | POA: Diagnosis not present

## 2019-06-05 ENCOUNTER — Other Ambulatory Visit: Payer: Self-pay

## 2019-06-05 ENCOUNTER — Ambulatory Visit
Admission: RE | Admit: 2019-06-05 | Discharge: 2019-06-05 | Disposition: A | Discharge status: Home / Self Care | Payer: Medicare HMO | Source: Ambulatory Visit | Attending: Radiation Oncology | Admitting: Radiation Oncology

## 2019-06-05 DIAGNOSIS — C61 Malignant neoplasm of prostate: Secondary | ICD-10-CM | POA: Diagnosis not present

## 2019-06-05 DIAGNOSIS — Z510 Encounter for antineoplastic radiation therapy: Secondary | ICD-10-CM | POA: Diagnosis not present

## 2019-06-06 ENCOUNTER — Ambulatory Visit
Admission: RE | Admit: 2019-06-06 | Discharge: 2019-06-06 | Disposition: A | Discharge status: Home / Self Care | Payer: Medicare HMO | Source: Ambulatory Visit | Attending: Radiation Oncology | Admitting: Radiation Oncology

## 2019-06-06 ENCOUNTER — Other Ambulatory Visit: Payer: Self-pay

## 2019-06-06 DIAGNOSIS — C61 Malignant neoplasm of prostate: Secondary | ICD-10-CM | POA: Diagnosis not present

## 2019-06-06 DIAGNOSIS — Z510 Encounter for antineoplastic radiation therapy: Secondary | ICD-10-CM | POA: Diagnosis not present

## 2019-06-07 ENCOUNTER — Other Ambulatory Visit: Payer: Self-pay

## 2019-06-07 ENCOUNTER — Ambulatory Visit
Admission: RE | Admit: 2019-06-07 | Discharge: 2019-06-07 | Disposition: A | Discharge status: Home / Self Care | Payer: Medicare HMO | Source: Ambulatory Visit | Attending: Radiation Oncology | Admitting: Radiation Oncology

## 2019-06-07 DIAGNOSIS — C61 Malignant neoplasm of prostate: Secondary | ICD-10-CM | POA: Diagnosis not present

## 2019-06-07 DIAGNOSIS — Z510 Encounter for antineoplastic radiation therapy: Secondary | ICD-10-CM | POA: Diagnosis not present

## 2019-06-08 ENCOUNTER — Other Ambulatory Visit: Payer: Self-pay

## 2019-06-08 ENCOUNTER — Ambulatory Visit
Admission: RE | Admit: 2019-06-08 | Discharge: 2019-06-08 | Disposition: A | Discharge status: Home / Self Care | Payer: Medicare HMO | Source: Ambulatory Visit | Attending: Radiation Oncology | Admitting: Radiation Oncology

## 2019-06-08 DIAGNOSIS — C61 Malignant neoplasm of prostate: Secondary | ICD-10-CM | POA: Diagnosis not present

## 2019-06-08 DIAGNOSIS — Z510 Encounter for antineoplastic radiation therapy: Secondary | ICD-10-CM | POA: Diagnosis not present

## 2019-06-12 ENCOUNTER — Ambulatory Visit
Admission: RE | Admit: 2019-06-12 | Discharge: 2019-06-12 | Disposition: A | Discharge status: Home / Self Care | Payer: Medicare HMO | Source: Ambulatory Visit | Attending: Radiation Oncology | Admitting: Radiation Oncology

## 2019-06-12 ENCOUNTER — Other Ambulatory Visit: Payer: Self-pay

## 2019-06-12 DIAGNOSIS — C61 Malignant neoplasm of prostate: Secondary | ICD-10-CM | POA: Diagnosis not present

## 2019-06-12 DIAGNOSIS — Z510 Encounter for antineoplastic radiation therapy: Secondary | ICD-10-CM | POA: Diagnosis not present

## 2019-06-13 ENCOUNTER — Ambulatory Visit
Admission: RE | Admit: 2019-06-13 | Discharge: 2019-06-13 | Disposition: A | Discharge status: Home / Self Care | Payer: Medicare HMO | Source: Ambulatory Visit | Attending: Radiation Oncology | Admitting: Radiation Oncology

## 2019-06-13 ENCOUNTER — Encounter: Payer: Self-pay | Admitting: Radiation Oncology

## 2019-06-13 ENCOUNTER — Other Ambulatory Visit: Payer: Self-pay

## 2019-06-13 DIAGNOSIS — Z510 Encounter for antineoplastic radiation therapy: Secondary | ICD-10-CM | POA: Diagnosis not present

## 2019-06-13 DIAGNOSIS — C61 Malignant neoplasm of prostate: Secondary | ICD-10-CM | POA: Diagnosis not present

## 2019-06-15 DIAGNOSIS — D485 Neoplasm of uncertain behavior of skin: Secondary | ICD-10-CM | POA: Diagnosis not present

## 2019-06-15 DIAGNOSIS — D2261 Melanocytic nevi of right upper limb, including shoulder: Secondary | ICD-10-CM | POA: Diagnosis not present

## 2019-06-15 DIAGNOSIS — Z85828 Personal history of other malignant neoplasm of skin: Secondary | ICD-10-CM | POA: Diagnosis not present

## 2019-06-15 DIAGNOSIS — L570 Actinic keratosis: Secondary | ICD-10-CM | POA: Diagnosis not present

## 2019-06-15 DIAGNOSIS — L821 Other seborrheic keratosis: Secondary | ICD-10-CM | POA: Diagnosis not present

## 2019-06-15 DIAGNOSIS — D225 Melanocytic nevi of trunk: Secondary | ICD-10-CM | POA: Diagnosis not present

## 2019-06-15 DIAGNOSIS — D1801 Hemangioma of skin and subcutaneous tissue: Secondary | ICD-10-CM | POA: Diagnosis not present

## 2019-06-15 DIAGNOSIS — D692 Other nonthrombocytopenic purpura: Secondary | ICD-10-CM | POA: Diagnosis not present

## 2019-06-15 DIAGNOSIS — C44612 Basal cell carcinoma of skin of right upper limb, including shoulder: Secondary | ICD-10-CM | POA: Diagnosis not present

## 2019-06-16 ENCOUNTER — Other Ambulatory Visit: Payer: Self-pay

## 2019-06-16 ENCOUNTER — Telehealth: Payer: Self-pay | Admitting: Family Medicine

## 2019-06-16 NOTE — Telephone Encounter (Signed)
I left a message asking the patient to call and schedule Medicare AWV with Loma Sousa ([place]) on 06/20/2019 before seeing Dr. Jonni Sanger.  I'm waiting for a call back to either confirm or decline the appointment. If patient calls back, please update appointment notes.  VDM (Dee-Dee)

## 2019-06-18 NOTE — Progress Notes (Signed)
  Radiation Oncology         [phone] ________________________________  Name: Caleb Gibson MRN: [id number]  Date: 06/13/2019  DOB: 24-Sep-1940  End of Treatment Note  Diagnosis:     79 y.o. gentleman with Stage T1c adenocarcinoma of the prostate with Gleason Score of 3+4, and PSA of 5.5  Indication for treatment:  Curative, Definitive Radiotherapy       Radiation treatment dates:   05/01/19-06/13/19  Site/dose:   The prostate was treated to 70 Gy in 28 fractions of 2.5 Gy  Beams/energy:   The patient was treated with IMRT using volumetric arc therapy delivering 6 MV X-rays to clockwise and counterclockwise circumferential arcs with a 90 degree collimator offset to avoid dose scalloping.  Image guidance was performed with daily cone beam CT prior to each fraction to align to gold markers in the prostate and assure proper bladder and rectal fill volumes.  Immobilization was achieved with BodyFix custom mold.  Narrative: The patient tolerated radiation treatment relatively well.   The patient experienced some minor urinary irritation and modest fatigue.    Plan: The patient has completed radiation treatment. He will return to radiation oncology clinic for routine followup in one month. I advised him to call or return sooner if he has any questions or concerns related to his recovery or treatment. ________________________________  Sheral Apley. Tammi Klippel, M.D.

## 2019-06-19 ENCOUNTER — Other Ambulatory Visit: Payer: Self-pay

## 2019-06-20 ENCOUNTER — Encounter: Payer: Self-pay | Admitting: Family Medicine

## 2019-06-20 ENCOUNTER — Ambulatory Visit: Payer: Medicare HMO

## 2019-06-20 ENCOUNTER — Ambulatory Visit (INDEPENDENT_AMBULATORY_CARE_PROVIDER_SITE_OTHER): Payer: Medicare HMO | Admitting: Family Medicine

## 2019-06-20 VITALS — BP 122/80 | HR 64 | Temp 97.8°F | Ht 70.0 in | Wt 222.2 lb

## 2019-06-20 DIAGNOSIS — E782 Mixed hyperlipidemia: Secondary | ICD-10-CM

## 2019-06-20 DIAGNOSIS — C61 Malignant neoplasm of prostate: Secondary | ICD-10-CM

## 2019-06-20 DIAGNOSIS — D126 Benign neoplasm of colon, unspecified: Secondary | ICD-10-CM

## 2019-06-20 DIAGNOSIS — Z79899 Other long term (current) drug therapy: Secondary | ICD-10-CM

## 2019-06-20 DIAGNOSIS — Z0000 Encounter for general adult medical examination without abnormal findings: Secondary | ICD-10-CM

## 2019-06-20 DIAGNOSIS — G3184 Mild cognitive impairment, so stated: Secondary | ICD-10-CM

## 2019-06-20 DIAGNOSIS — F40240 Claustrophobia: Secondary | ICD-10-CM | POA: Diagnosis not present

## 2019-06-20 LAB — CBC WITH DIFFERENTIAL/PLATELET
Basophils Absolute: 0 10*3/uL (ref 0.0–0.1)
Basophils Relative: 0.6 % (ref 0.0–3.0)
Eosinophils Absolute: 0.1 10*3/uL (ref 0.0–0.7)
Eosinophils Relative: 2.3 % (ref 0.0–5.0)
HCT: 39 % (ref 39.0–52.0)
Hemoglobin: 13.3 g/dL (ref 13.0–17.0)
Lymphocytes Relative: 15.7 % (ref 12.0–46.0)
Lymphs Abs: 0.7 10*3/uL (ref 0.7–4.0)
MCHC: 34 g/dL (ref 30.0–36.0)
MCV: 94.7 fl (ref 78.0–100.0)
Monocytes Absolute: 0.5 10*3/uL (ref 0.1–1.0)
Monocytes Relative: 11.3 % (ref 3.0–12.0)
Neutro Abs: 3 10*3/uL (ref 1.4–7.7)
Neutrophils Relative %: 70.1 % (ref 43.0–77.0)
Platelets: 178 10*3/uL (ref 150.0–400.0)
RBC: 4.12 Mil/uL — ABNORMAL LOW (ref 4.22–5.81)
RDW: 13.7 % (ref 11.5–15.5)
WBC: 4.2 10*3/uL (ref 4.0–10.5)

## 2019-06-20 LAB — LIPID PANEL
Cholesterol: 133 mg/dL (ref 0–200)
HDL: 66.6 mg/dL (ref >39.00)
LDL Cholesterol: 44 mg/dL (ref 0–99)
NonHDL: 66.22
Total CHOL/HDL Ratio: 2
Triglycerides: 109 mg/dL (ref 0.0–149.0)
VLDL: 21.8 mg/dL (ref 0.0–40.0)

## 2019-06-20 LAB — COMPREHENSIVE METABOLIC PANEL
ALT: 13 U/L (ref 0–53)
AST: 13 U/L (ref 0–37)
Albumin: 4.3 g/dL (ref 3.5–5.2)
Alkaline Phosphatase: 65 U/L (ref 39–117)
BUN: 22 mg/dL (ref 6–23)
CO2: 28 mEq/L (ref 19–32)
Calcium: 9.3 mg/dL (ref 8.4–10.5)
Chloride: 106 mEq/L (ref 96–112)
Creatinine, Ser: 1.02 mg/dL (ref 0.40–1.50)
GFR: 70.42 mL/min (ref >60.00)
Glucose, Bld: 97 mg/dL (ref 70–99)
Potassium: 4.6 mEq/L (ref 3.5–5.1)
Sodium: 139 mEq/L (ref 135–145)
Total Bilirubin: 0.3 mg/dL (ref 0.2–1.2)
Total Protein: 6.4 g/dL (ref 6.0–8.3)

## 2019-06-20 LAB — TSH: TSH: 1.31 u[IU]/mL (ref 0.35–4.50)

## 2019-06-20 MED ORDER — CITALOPRAM HYDROBROMIDE 10 MG PO TABS: 10.0000 mg | ORAL_TABLET | Freq: Every day | ORAL | 3 refills | Status: DC

## 2019-06-20 NOTE — Patient Instructions (Addendum)
Please return in 6 months for follow up of your hypertension. Medicare recommends an Annual Wellness Visit for all patients. Please schedule this to be done with our Nurse Educator, Loma Sousa. This can be scheduled virtually if needed. This is an informative "talk" visit; it's goals are to ensure that your health care needs are being met and to give you education regarding avoiding falls, ensuring you are not suffering from depression or problems with memory or thinking, and to educate you on Advance Care Planning. It helps me take good care of you!  Glad you are doing so well.  Please ask DeDe what reaction you had to your namenda.  If you have any questions or concerns, please don't hesitate to send me a message via MyChart or call the office at [phone]. Thank you for visiting with [place] today! It's our pleasure caring for you.  Please do these things to maintain good health!   Exercise at least 30-45 minutes a day,  4-5 days a week.   Eat a low-fat diet with lots of fruits and vegetables, up to 7-9 servings per day.  Drink plenty of water daily. Try to drink 8 8oz glasses per day.  Seatbelts can save your life. Always wear your seatbelt.  Place Smoke Detectors on every level of your home and check batteries every year.  Eye Doctor - have an eye exam every 1-2 years  Avoid heavy alcohol use. If you drink, keep it to less than 2 drinks/day and not every day.  [place].  Choose someone you trust that could speak for you if you became unable to speak for yourself.  Depression is common in our stressful world.If you're feeling down or losing interest in things you normally enjoy, please come in for a visit.

## 2019-06-20 NOTE — Progress Notes (Signed)
Subjective  Chief Complaint  Patient presents with  . Annual Exam  . Hyperlipidemia  . Hypertension  . Hypothyroidism  . Memory Loss    HPI: Caleb Gibson is a 79 y.o. male who presents to [facility] today for a Male Wellness Visit. He also has the concerns and/or needs as listed above in the chief complaint. These will be addressed in addition to the Health Maintenance Visit.   Wellness Visit: annual visit with health maintenance review and exam    HM: Annual physical exam.  Nonfasting for lab work today.  Overall doing very well.  Feels well physically and emotionally.  Memory is improved.  Just completed radiation therapy for prostate cancer and tolerated this extremely well.  He has follow-up with urology next month.  Immunizations are up-to-date.  Colon cancer screening is up-to-date.  He is due for an annual wellness visit Lifestyle: Body mass index is 31.88 kg/m. Wt Readings from Last 3 Encounters:  06/20/19 222 lb 3.2 oz (100.8 kg)  12/26/18 220 lb (99.8 kg)  11/30/18 221 lb 3.2 oz (100.3 kg)    Chronic disease management visit and/or acute problem visit:  Prostate cancer: Completed radiation therapy as mentioned above  Mild cognitive impairment: Reviewed neurology notes from July.  Reviewed MoCA testing score 29 of 30.  He is on Aricept.  He had subjective decrease in memory at that time and [place] was started however he is no longer taking it because he feels he might of had a reaction to it.  He cannot remember what the reaction is.  At this point however he feels his memory is much improved.  He has excellent days and then good days.  No more sleep disturbances or parasomnia behavior.  His claustrophobia remains well controlled on Celexa.  On statin therapy due to elevated cardiovascular risk.  Tolerating well.  Due for recheck.   Patient Active Problem List   Diagnosis Date Noted  . Mild cognitive impairment 05/10/2018  . Prostate cancer  ([place]) 11/22/2017  . Tubular adenoma of colon 06/15/2017  . On statin therapy due to risk of future cardiovascular event 10/08/2015  . Mixed hyperlipidemia 09/27/2014  . Idiopathic hypotension 01/07/2016  . BPH (benign prostatic hyperplasia) 10/01/2011  . Erectile dysfunction 10/01/2011  . Osteoarthritis, knee 08/01/2010  . Claustrophobia 07/08/2009  . Basal cell carcinoma of skin 06/15/2017  . Simple cyst of kidney 11/12/2011  . Allergic rhinitis 08/27/2011  . History of traumatic head injury 08/04/2018  . Memory loss 08/04/2018   Health Maintenance  Topic Date Due  . COLONOSCOPY  09/01/2021  . TETANUS/TDAP  09/30/2021  . INFLUENZA VACCINE  Completed  . PNA vac Low Risk Adult  Completed   Immunization History  Administered Date(s) Administered  . Influenza Split 03/14/2011  . Influenza, High Dose Seasonal PF 02/27/2012, 02/05/2014, 03/10/2015, 04/14/2016, 03/02/2017, 02/23/2018, 01/26/2019, 01/26/2019  . Influenza, Seasonal, Injecte, Preservative Fre 02/27/2013  . Influenza-Unspecified 04/15/2017  . Pneumococcal Conjugate-13 02/05/2014  . Pneumococcal-Unspecified 07/08/2009  . Td 11/15/2007  . Tdap 10/01/2011  . Zoster 06/20/2010  . Zoster Recombinat (Shingrix) 02/05/2018, 04/18/2018   We updated and reviewed the patient's past history in detail and it is documented below. Allergies: Patient is allergic to viagra [sildenafil citrate]. Past Medical History  has a past medical history of Anxiety, Basal cell carcinoma of skin, Claustrophobia, High cholesterol, Memory loss, Night terrors, Prostate cancer ([place]), Sleep walking, and Tubular adenoma of colon (06/15/2017). Past Surgical History Patient  has a past surgical history that includes Appendectomy; Dental surgery; Rhinoplasty; Tonsillectomy and adenoidectomy; Mohs surgery; and Knee arthroscopy with meniscal repair (Right). Social History Patient  reports that he quit smoking about 25 years ago. His smoking use included  cigarettes. He has never used smokeless tobacco. He reports current alcohol use. He reports that he does not use drugs. Family History family history includes Alcohol abuse in his maternal aunt and mother; Cirrhosis in his maternal aunt; Diabetes in his father; Glaucoma in his father; Heart disease in his maternal grandmother; Lymphoma in his mother; Melanoma in his mother; Prostate cancer in his father. Review of Systems: Constitutional: negative for fever or malaise Ophthalmic: negative for photophobia, double vision or loss of vision Cardiovascular: negative for chest pain, dyspnea on exertion, or new LE swelling Respiratory: negative for SOB or persistent cough Gastrointestinal: negative for abdominal pain, change in bowel habits or melena Genitourinary: negative for dysuria or gross hematuria Musculoskeletal: negative for new gait disturbance or muscular weakness Integumentary: negative for new or persistent rashes Neurological: negative for TIA or stroke symptoms Psychiatric: negative for SI or delusions Allergic/Immunologic: negative for hives  Patient Care Team    Relationship Specialty Notifications Start End  [name], MD PCP - General Family Medicine  06/15/17   Ceasar Mons, MD Consulting Physician Urology  01/13/18   Manson Passey, [facility]  Specialist  01/13/18   Danella Sensing, MD Consulting Physician Dermatology  01/13/18   Ronnald Ramp  Dentistry  01/13/18   Marcial Pacas, MD Consulting Physician Neurology  11/30/18    Objective  Vitals: BP 122/80 (BP Location: Right Arm, Patient Position: Sitting, Cuff Size: Normal)   Pulse 64   Temp 97.8 F (36.6 C) (Temporal)   Ht 5\' 10"  (1.778 m)   Wt 222 lb 3.2 oz (100.8 kg)   SpO2 97%   BMI 31.88 kg/m  General:  Well developed, well nourished, no acute distress  Psych:  Alert and orientedx3,normal mood and affect HEENT:  Normocephalic, atraumatic, non-icteric sclera, PERRL, supple neck without adenopathy, mass or  thyromegaly Cardiovascular:  Normal S1, S2, RRR without gallop, rub or murmur, +2 distal pulses in bilateral upper and lower extremities. Respiratory:  Good breath sounds bilaterally, CTAB with normal respiratory effort Gastrointestinal: normal bowel sounds, soft, non-tender, no noted masses. No HSM MSK: no deformities, contusions. Joints are without erythema or swelling. Spine and CVA region are nontender Skin:  Warm, no rashes or suspicious lesions noted Neurologic:    Mental status is normal.  Gross motor and sensory exams are normal.  No tremor    Assessment  1. Annual physical exam   2. Mild cognitive impairment   3. Malignant neoplasm of prostate ([place])   4. Tubular adenoma of colon   5. On statin therapy due to risk of future cardiovascular event   6. Mixed hyperlipidemia      Plan  Male Wellness Visit:  Age appropriate Health Maintenance and Prevention measures were discussed with patient. Included topics are cancer screening recommendations, ways to keep healthy (see AVS) including dietary and exercise recommendations, regular eye and dental care, use of seat belts, and avoidance of moderate alcohol use and tobacco use.  Screens are up-to-date  BMI: discussed patient's BMI and encouraged positive lifestyle modifications to help get to or maintain a target BMI.  HM needs and immunizations were addressed and ordered. See below for orders. See HM and immunization section for updates.  Up-to-date  Routine labs and screening tests ordered including cmp, cbc  and lipids where appropriate.  Discussed recommendations regarding Vit D and calcium supplementation (see AVS)  Chronic disease f/u and/or acute problem visit: (deemed necessary to be done in addition to the wellness visit):  Mild cognitive impairment is improved.  Continue Aricept.  He will ask his wife what reaction he had to [place].  He will follow-up with neurology in July.  Prostate cancer status post radiation therapy.   Tolerated well.  History of tubular adenomas: Colonoscopy next due next year.  Recheck lipids on statin with LFTs  Continue Celexa long-term for history of claustrophobia  Follow up: Return in about 6 months (around 12/18/2019) for follow up Hypertension, AWV at patient's convenience.   Commons side effects, risks, benefits, and alternatives for medications and treatment plan prescribed today were discussed, and the patient expressed understanding of the given instructions. Patient is instructed to call or message via MyChart if he/she has any questions or concerns regarding our treatment plan. No barriers to understanding were identified. We discussed Red Flag symptoms and signs in detail. Patient expressed understanding regarding what to do in case of urgent or emergency type symptoms.   Medication list was reconciled, printed and provided to the patient in AVS. Patient instructions and summary information was reviewed with the patient as documented in the AVS. This note was prepared with assistance of Dragon voice recognition software. Occasional wrong-word or sound-a-like substitutions may have occurred due to the inherent limitations of voice recognition software  This visit occurred during the SARS-CoV-2 public health emergency.  Safety protocols were in place, including screening questions prior to the visit, additional usage of staff PPE, and extensive cleaning of exam room while observing appropriate contact time as indicated for disinfecting solutions.   Orders Placed This Encounter  Procedures  . CBC w/Diff  . CMP  . Lipids  . TSH   Meds ordered this encounter  Medications  . citalopram (CELEXA) 10 MG tablet    Sig: Take 1 tablet (10 mg total) by mouth daily.    Dispense:  90 tablet    Refill:  3

## 2019-06-21 ENCOUNTER — Encounter: Payer: Self-pay | Admitting: Family Medicine

## 2019-06-21 NOTE — Progress Notes (Signed)
Lab results mailed to patient in letter. Normal results. No action / follow up needed on these results.

## 2019-06-21 NOTE — Progress Notes (Signed)
I have reviewed results. Normal. Patient notified by letter. Please see letter for details.

## 2019-06-25 ENCOUNTER — Other Ambulatory Visit: Payer: Self-pay | Admitting: Neurology
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
## 2019-07-04 ENCOUNTER — Ambulatory Visit: Payer: Medicare HMO | Admitting: Neurology

## 2019-07-04 ENCOUNTER — Encounter: Payer: Self-pay | Admitting: Neurology

## 2019-07-04 ENCOUNTER — Other Ambulatory Visit: Payer: Self-pay

## 2019-07-04 VITALS — BP 122/80 | HR 57 | Temp 97.7°F | Ht 70.0 in | Wt 224.8 lb

## 2019-07-04 DIAGNOSIS — R413 Other amnesia: Secondary | ICD-10-CM

## 2019-07-04 MED ORDER — DONEPEZIL HCL 10 MG PO TABS: 10.0000 mg | ORAL_TABLET | Freq: Every day | ORAL | 3 refills | Status: DC

## 2019-07-04 NOTE — Patient Instructions (Signed)
It was great to meet you both day!   You memory score is stable 27/30  Continue Aricept and Namenda   Return in 8 months

## 2019-07-05 ENCOUNTER — Other Ambulatory Visit: Payer: Self-pay | Admitting: Family Medicine

## 2019-07-20 ENCOUNTER — Other Ambulatory Visit: Payer: Self-pay

## 2019-07-20 ENCOUNTER — Encounter: Payer: Self-pay | Admitting: Urology

## 2019-07-20 ENCOUNTER — Ambulatory Visit
Admission: RE | Admit: 2019-07-20 | Discharge: 2019-07-20 | Disposition: A | Discharge status: Home / Self Care | Payer: Medicare HMO | Source: Ambulatory Visit | Attending: Radiation Oncology | Admitting: Radiation Oncology

## 2019-07-20 DIAGNOSIS — Z510 Encounter for antineoplastic radiation therapy: Secondary | ICD-10-CM | POA: Insufficient documentation

## 2019-07-20 DIAGNOSIS — C61 Malignant neoplasm of prostate: Secondary | ICD-10-CM

## 2019-07-20 NOTE — Progress Notes (Signed)
Radiation Oncology         [phone] ________________________________  Name: Caleb Gibson MRN: [id number]  Date: 07/20/2019  DOB: 08-Feb-1941  Post Treatment Note  CC: [name], MD  [name], MD  Diagnosis:   78y.o. gentleman with Stage T1c adenocarcinoma of the prostate with Gleason Score of 3+4, and PSA of 5.5  Interval Since Last Radiation:  5 weeks  05/01/19-06/13/19:The prostate was treated to 70 Gy in 28 fractions of 2.5 Gy  Narrative:  I spoke with the patient to conduct his routine scheduled 1 month follow up visit via telephone to spare the patient unnecessary potential exposure in the healthcare setting during the current COVID-19 pandemic.  The patient was notified in advance and gave permission to proceed with this visit format. He tolerated radiation treatment relatively well.   The patient experienced some minor urinary irritation and modest fatigue.                                On review of systems, the patient states that he is doing very well overall.  He has noticed a gradual improvement in his LUTS and at this point continues with mild hesitancy, urgency, and frequency but specifically denies dysuria, gross hematuria, straining to void, incomplete bladder emptying or incontinence.  He reports some mild constipation but denies abdominal pain, nausea, vomiting or diarrhea.  He has not had recent fever, chills or night sweats.  He reports a healthy appetite and denies any significant fatigue.  Overall, he is quite pleased with his progress to date.  ALLERGIES:  is allergic to viagra [sildenafil citrate].  Meds: Current Outpatient Medications  Medication Sig Dispense Refill  . Amino Acids (L-CARNITINE PO) Take 250 mg by mouth daily.    Marland Kitchen CALCIUM CITRATE PO Take 600 mg by mouth daily.     . cholecalciferol (VITAMIN D) 1000 units tablet Take 1,000 Units by mouth daily.    . citalopram (CELEXA) 10 MG tablet Take 1 tablet (10 mg total) by mouth daily. 90 tablet  3  . clonazePAM (KLONOPIN) 0.5 MG tablet TAKE 1 TABLET BY MOUTH AT BEDTIME AS NEEDED FOR ANXIETY 30 tablet 2  . donepezil (ARICEPT) 10 MG tablet Take 1 tablet (10 mg total) by mouth daily. 90 tablet 3  . FLUZONE HIGH-DOSE QUADRIVALENT 0.7 ML SUSY Inject 0.7 mLs into the muscle once.    Marland Kitchen GARCINIA CAMBOGIA-CHROMIUM PO Take 1 tablet by mouth daily.    Marland Kitchen glucosamine-chondroitin 500-400 MG tablet Take 1 tablet by mouth daily.     . Lycopene 10 MG CAPS Take 1 capsule by mouth daily.     . memantine (NAMENDA) 10 MG tablet Take 1 tablet (10 mg total) by mouth 2 (two) times daily. 180 tablet 4  . Multiple Vitamins-Minerals (CENTRUM SILVER) tablet Take 1 tablet by mouth daily.    . Multiple Vitamins-Minerals (EYE VITAMINS PO)     . Omega-3 Fatty Acids (FISH OIL) 1200 MG CAPS Take by mouth.    Marland Kitchen OVER THE COUNTER MEDICATION Take 120 mg by mouth daily. Ginko Biloba    . rosuvastatin (CRESTOR) 5 MG tablet Take 1/2 (one-half) tablet by mouth once daily 45 tablet 3  . Saw Palmetto, Serenoa repens, 450 MG CAPS Take 1 capsule by mouth daily.     . tadalafil (CIALIS) 5 MG tablet TAKE 1 TO 4 TABLETS BY MOUTH PRIOR TO INTERCOURSE  0  . vitamin  C (ASCORBIC ACID) 500 MG tablet Take 500 mg by mouth daily.      No current facility-administered medications for this encounter.    Physical Findings:  vitals were not taken for this visit.   /10 Unable to assess due to telephone follow up visit format.  Lab Findings: Lab Results  Component Value Date   WBC 4.2 06/20/2019   HGB 13.3 06/20/2019   HCT 39.0 06/20/2019   MCV 94.7 06/20/2019   PLT 178.0 06/20/2019     Radiographic Findings: No results found.  Impression/Plan: 1. 78y.o. gentleman with Stage T1c adenocarcinoma of the prostate with Gleason Score of 3+4, and PSA of 5.5.   He will continue to follow up with urology for ongoing PSA determinations and has an appointment scheduled with Dr. Lovena Neighbours later this month following his lab visit scheduled for  07/25/19. He understands what to expect with regards to PSA monitoring going forward. I will look forward to following his response to treatment via correspondence with urology, and would be happy to continue to participate in his care if clinically indicated. I talked to the patient about what to expect in the future, including his risk for erectile dysfunction and rectal bleeding. I encouraged him to call or return to the office if he has any questions regarding his previous radiation or possible radiation side effects. He was comfortable with this plan and will follow up as needed.    Nicholos Johns, PA-C

## 2019-07-25 DIAGNOSIS — C61 Malignant neoplasm of prostate: Secondary | ICD-10-CM | POA: Diagnosis not present

## 2019-07-25 LAB — PSA: PSA: 2.66

## 2019-08-01 DIAGNOSIS — N5201 Erectile dysfunction due to arterial insufficiency: Secondary | ICD-10-CM | POA: Diagnosis not present

## 2019-08-01 DIAGNOSIS — R3911 Hesitancy of micturition: Secondary | ICD-10-CM | POA: Diagnosis not present

## 2019-08-01 DIAGNOSIS — R350 Frequency of micturition: Secondary | ICD-10-CM | POA: Diagnosis not present

## 2019-08-01 DIAGNOSIS — C61 Malignant neoplasm of prostate: Secondary | ICD-10-CM | POA: Diagnosis not present

## 2019-08-01 DIAGNOSIS — R3912 Poor urinary stream: Secondary | ICD-10-CM | POA: Diagnosis not present

## 2019-08-09 NOTE — Progress Notes (Signed)
I have reviewed and agreed above plan. 

## 2019-08-11 ENCOUNTER — Encounter: Payer: Self-pay | Admitting: Family Medicine

## 2019-08-11 ENCOUNTER — Telehealth: Payer: Self-pay

## 2019-08-11 NOTE — Telephone Encounter (Signed)
Patient requesting information regarding his last vitamin D. Please return patient call as soon as possible

## 2019-08-14 ENCOUNTER — Telehealth: Payer: Self-pay | Admitting: Family Medicine

## 2019-08-14 NOTE — Telephone Encounter (Signed)
LVM for patient to return call. 

## 2019-08-14 NOTE — Telephone Encounter (Signed)
Patient called in this morning saying he would like to know if Dr.Andy would recommend the covid vaccine.

## 2019-08-17 NOTE — Telephone Encounter (Signed)
LVM for patient to return call. 

## 2019-08-22 ENCOUNTER — Telehealth: Payer: Self-pay

## 2019-08-22 NOTE — Telephone Encounter (Signed)
Patient is calling requesting labs to check for his vitamin D levels and his blood type. Please advise. Thanks.

## 2019-08-22 NOTE — Telephone Encounter (Signed)
Unable to reach patient.

## 2019-08-23 NOTE — Telephone Encounter (Signed)
He can schedule a lab visit for those. However, insurance will not cover either. Thanks. [facility].

## 2019-08-23 NOTE — Telephone Encounter (Signed)
LVM for patient to return call. 

## 2019-08-23 NOTE — Telephone Encounter (Signed)
Patient returned call and was notified of message.

## 2019-09-29 ENCOUNTER — Encounter: Payer: Self-pay | Admitting: Family Medicine

## 2019-09-29 ENCOUNTER — Other Ambulatory Visit: Payer: Self-pay

## 2019-09-29 ENCOUNTER — Ambulatory Visit (INDEPENDENT_AMBULATORY_CARE_PROVIDER_SITE_OTHER): Payer: Medicare HMO | Admitting: Family Medicine

## 2019-09-29 VITALS — BP 110/58 | HR 63 | Temp 96.3°F | Resp 14 | Wt 223.4 lb

## 2019-09-29 DIAGNOSIS — Z7189 Other specified counseling: Secondary | ICD-10-CM | POA: Diagnosis not present

## 2019-09-29 DIAGNOSIS — E559 Vitamin D deficiency, unspecified: Secondary | ICD-10-CM

## 2019-09-29 DIAGNOSIS — Z7185 Encounter for immunization safety counseling: Secondary | ICD-10-CM

## 2019-09-29 LAB — VITAMIN D 25 HYDROXY (VIT D DEFICIENCY, FRACTURES): VITD: 58.22 ng/mL (ref 30.00–100.00)

## 2019-09-29 NOTE — Patient Instructions (Signed)
Please follow up as scheduled for your next visit with me: 12/22/2019   If you have any questions or concerns, please don't hesitate to send me a message via MyChart or call the office at [phone]. Thank you for visiting with [place] today! It's our pleasure caring for you.  I will release your lab results to you on your MyChart account with further instructions. Please reply with any questions.

## 2019-09-29 NOTE — Progress Notes (Signed)
Please call patient: I have reviewed his/her lab results. Vitamin D level is 58; this is in the normal range. Continue daily supplements.

## 2019-09-29 NOTE — Progress Notes (Signed)
Subjective  CC:  Chief Complaint  Patient presents with  . Covid Vaccine     patient states that he cannot make up his mind on which vaccine to receive or if to get vaccinated at all  . Vitamin D    wanting Vit D checked    HPI: Caleb Gibson is a 79 y.o. male who presents to the office today to address the problems listed above in the chief complaint.  covid vaccine and ivermectin covid treatment and prevention trial counseling: pt is hesitant to take covid vaccine for many reasons. Is interested in getting a RX for ivermectin for prevention in stead. Has questions about vaccine and enrolling in a clinical trial for ivermectin.   Would like vit D level checked. Has read that normal vit D levels can help prevent covid as well.    Assessment  1. Vaccine counseling   2. Vitamin D deficiency      Plan   covid vaccine counseling:  Education and counseling given. Pt is hesitant.   Clinical trial of ivermectin: unlikely to be harmful; could be helpful to know if it is helpful in prevention of covid infection. Explained it is not currently an FDA approved or emergency approved preventive medication so can give him a RX.   Check VitD levels.   Follow up: as scheduled.   12/22/2019  Orders Placed This Encounter  Procedures  . VITAMIN D 25 Hydroxy (Vit-D Deficiency, Fractures)   No orders of the defined types were placed in this encounter.     I reviewed the patients updated PMH, FH, and SocHx.    Patient Active Problem List   Diagnosis Date Noted  . Mild cognitive impairment 05/10/2018    Priority: High  . Prostate cancer ([place]) 11/22/2017    Priority: High  . Tubular adenoma of colon 06/15/2017    Priority: High  . On statin therapy due to risk of future cardiovascular event 10/08/2015    Priority: High  . Mixed hyperlipidemia 09/27/2014    Priority: High  . Idiopathic hypotension 01/07/2016    Priority: Medium  . BPH (benign prostatic hyperplasia) 10/01/2011     Priority: Medium  . Erectile dysfunction 10/01/2011    Priority: Medium  . Osteoarthritis, knee 08/01/2010    Priority: Medium  . Claustrophobia 07/08/2009    Priority: Medium  . Basal cell carcinoma of skin 06/15/2017    Priority: Low  . Simple cyst of kidney 11/12/2011    Priority: Low  . Allergic rhinitis 08/27/2011    Priority: Low  . History of traumatic head injury 08/04/2018  . Memory loss 08/04/2018   Current Meds  Medication Sig  . Amino Acids (L-CARNITINE PO) Take 250 mg by mouth daily.  Marland Kitchen CALCIUM CITRATE PO Take 600 mg by mouth daily.   . cholecalciferol (VITAMIN D) 1000 units tablet Take 1,000 Units by mouth daily.  . citalopram (CELEXA) 10 MG tablet Take 1 tablet (10 mg total) by mouth daily.  . clonazePAM (KLONOPIN) 0.5 MG tablet TAKE 1 TABLET BY MOUTH AT BEDTIME AS NEEDED FOR ANXIETY  . donepezil (ARICEPT) 10 MG tablet Take 1 tablet (10 mg total) by mouth daily.  . Fluticasone Propionate (FLONASE NA) Place into the nose.  Marland Kitchen FLUZONE HIGH-DOSE QUADRIVALENT 0.7 ML SUSY Inject 0.7 mLs into the muscle once.  Marland Kitchen GARCINIA CAMBOGIA-CHROMIUM PO Take 1 tablet by mouth daily.  Marland Kitchen glucosamine-chondroitin 500-400 MG tablet Take 1 tablet by mouth daily.   . Lycopene 10 MG CAPS  Take 1 capsule by mouth daily.   . memantine (NAMENDA) 10 MG tablet Take 1 tablet (10 mg total) by mouth 2 (two) times daily.  . Multiple Vitamins-Minerals (CENTRUM SILVER) tablet Take 1 tablet by mouth daily.  . Multiple Vitamins-Minerals (EYE VITAMINS PO)   . Omega-3 Fatty Acids (FISH OIL) 1200 MG CAPS Take by mouth.  Marland Kitchen OVER THE COUNTER MEDICATION Take 120 mg by mouth daily. Ginko Biloba  . rosuvastatin (CRESTOR) 5 MG tablet Take 1/2 (one-half) tablet by mouth once daily  . Saw Palmetto, Serenoa repens, 450 MG CAPS Take 1 capsule by mouth daily.   . tadalafil (CIALIS) 5 MG tablet TAKE 1 TO 4 TABLETS BY MOUTH PRIOR TO INTERCOURSE  . vitamin C (ASCORBIC ACID) 500 MG tablet Take 500 mg by mouth daily.      Allergies: Patient is allergic to viagra [sildenafil citrate]. Family History: Patient family history includes Alcohol abuse in his maternal aunt and mother; Cirrhosis in his maternal aunt; Diabetes in his father; Glaucoma in his father; Heart disease in his maternal grandmother; Lymphoma in his mother; Melanoma in his mother; Prostate cancer in his father. Social History:  Patient  reports that he quit smoking about 25 years ago. His smoking use included cigarettes. He has never used smokeless tobacco. He reports current alcohol use. He reports that he does not use drugs.  Review of Systems: Constitutional: Negative for fever malaise or anorexia Cardiovascular: negative for chest pain Respiratory: negative for SOB or persistent cough Gastrointestinal: negative for abdominal pain  Objective  Vitals: BP (!) 110/58   Pulse 63   Temp (!) 96.3 F (35.7 C) (Temporal)   Resp 14   Wt 223 lb 6.4 oz (101.3 kg)   SpO2 93%   BMI 32.05 kg/m  General: no acute distress , A&Ox3    Medication list was reconciled, printed and provided to the patient in AVS. Patient instructions and summary information was reviewed with the patient as documented in the AVS. This note was prepared with assistance of Dragon voice recognition software. Occasional wrong-word or sound-a-like substitutions may have occurred due to the inherent limitations of voice recognition software  This visit occurred during the SARS-CoV-2 public health emergency.  Safety protocols were in place, including screening questions prior to the visit, additional usage of staff PPE, and extensive cleaning of exam room while observing appropriate contact time as indicated for disinfecting solutions.

## 2019-10-05 ENCOUNTER — Ambulatory Visit (INDEPENDENT_AMBULATORY_CARE_PROVIDER_SITE_OTHER): Payer: Medicare HMO

## 2019-10-05 ENCOUNTER — Other Ambulatory Visit: Payer: Self-pay

## 2019-10-05 DIAGNOSIS — Z0000 Encounter for general adult medical examination without abnormal findings: Secondary | ICD-10-CM

## 2019-10-05 NOTE — Progress Notes (Signed)
This visit is being conducted via phone call due to the COVID-19 pandemic. This patient has given me verbal consent via phone to conduct this visit, patient states they are participating from their home address. Some vital signs may be absent or patient reported.   Patient identification: identified by name, DOB, and current address.  Location provider: [facility], Office Persons participating in the virtual visit: Denman George LPN, patient, spouse Latravis Mabile), and Dr. Orma Flaming   Subjective:   Caleb Gibson is a 79 y.o. male who presents for Medicare Annual/Subsequent preventive examination.  Review of Systems:   Cardiac Risk Factors include: advanced age (>14men, >14 women);male gender;dyslipidemia    Objective:    Vitals: There were no vitals taken for this visit.  There is no height or weight on file to calculate BMI.  Advanced Directives 10/05/2019 07/20/2019 03/07/2019 01/13/2018 03/27/2016  Does Patient Have a Medical Advance Directive? Yes No No Yes No  Type of Advance Directive Living will;Healthcare Power of [place];Living will -  Does patient want to make changes to medical advance directive? No - Patient declined - - - -  Copy of [place] in Chart? No - copy requested - - No - copy requested -  Would patient like information on creating a medical advance directive? - No - Patient declined - - -    Tobacco Social History   Tobacco Use  Smoking Status Former Smoker  . Types: Cigarettes  . Quit date: 35  . Years since quitting: 25.3  Smokeless Tobacco Never Used     Counseling given: Not Answered   Clinical Intake:  Pre-visit preparation completed: Yes  Pain : No/denies pain  Diabetes: No  How often do you need to have someone help you when you read instructions, pamphlets, or other written materials from your doctor or pharmacy?: 1 - Never  Interpreter Needed?: No  Information entered by ::  Denman George LPN  Past Medical History:  Diagnosis Date  . Anxiety   . Basal cell carcinoma of skin   . Claustrophobia   . High cholesterol   . Memory loss   . Night terrors   . Prostate cancer ([place])   . Sleep walking   . Tubular adenoma of colon 06/15/2017   Colonoscopy 08/2016; rec repeat in 5 years.    Past Surgical History:  Procedure Laterality Date  . APPENDECTOMY    . DENTAL SURGERY     Had a tooth pulled in 01/2017  . KNEE ARTHROSCOPY WITH MENISCAL REPAIR Right   . MOHS SURGERY    . RHINOPLASTY    . TONSILLECTOMY AND ADENOIDECTOMY     Family History  Problem Relation Age of Onset  . Alcohol abuse Mother   . Melanoma Mother   . Lymphoma Mother   . Prostate cancer Father   . Diabetes Father   . Glaucoma Father   . Heart disease Maternal Grandmother   . Alcohol abuse Maternal Aunt   . Cirrhosis Maternal Aunt    Social History   Socioeconomic History  . Marital status: Married    Spouse name: Not on file  . Number of children: 3  . Years of education: 2 years college  . Highest education level: Not on file  Occupational History    Comment: retired  Tobacco Use  . Smoking status: Former Smoker    Types: Cigarettes    Quit date: 1996    Years since quitting:  25.3  . Smokeless tobacco: Never Used  Substance and Sexual Activity  . Alcohol use: Yes    Comment: 2-6 glasses of wine per week  . Drug use: No  . Sexual activity: Yes  Other Topics Concern  . Not on file  Social History Narrative   Lives at home with his wife.   One 12oz can of Diet Coke per day.   Right-handed.   Social Determinants of Health   Financial Resource Strain:   . Difficulty of Paying Living Expenses:   Food Insecurity:   . Worried About Charity fundraiser in the Last Year:   . Arboriculturist in the Last Year:   Transportation Needs:   . Film/video editor (Medical):   Marland Kitchen Lack of Transportation (Non-Medical):   Physical Activity:   . Days of Exercise per Week:   .  Minutes of Exercise per Session:   Stress:   . Feeling of Stress :   Social Connections:   . Frequency of Communication with Friends and Family:   . Frequency of Social Gatherings with Friends and Family:   . Attends Religious Services:   . Active Member of Clubs or Organizations:   . Attends Archivist Meetings:   Marland Kitchen Marital Status:     Outpatient Encounter Medications as of 10/05/2019  Medication Sig  . Amino Acids (L-CARNITINE PO) Take 250 mg by mouth daily.  Marland Kitchen CALCIUM CITRATE PO Take 600 mg by mouth daily.   . cholecalciferol (VITAMIN D) 1000 units tablet Take 1,000 Units by mouth daily.  . citalopram (CELEXA) 10 MG tablet Take 1 tablet (10 mg total) by mouth daily.  . clonazePAM (KLONOPIN) 0.5 MG tablet TAKE 1 TABLET BY MOUTH AT BEDTIME AS NEEDED FOR ANXIETY  . donepezil (ARICEPT) 10 MG tablet Take 1 tablet (10 mg total) by mouth daily.  . Fluticasone Propionate (FLONASE NA) Place into the nose.  Marland Kitchen FLUZONE HIGH-DOSE QUADRIVALENT 0.7 ML SUSY Inject 0.7 mLs into the muscle once.  Marland Kitchen GARCINIA CAMBOGIA-CHROMIUM PO Take 1 tablet by mouth daily.  Marland Kitchen glucosamine-chondroitin 500-400 MG tablet Take 1 tablet by mouth daily.   . Lycopene 10 MG CAPS Take 1 capsule by mouth daily.   . memantine (NAMENDA) 10 MG tablet Take 1 tablet (10 mg total) by mouth 2 (two) times daily.  . Multiple Vitamins-Minerals (CENTRUM SILVER) tablet Take 1 tablet by mouth daily.  . Multiple Vitamins-Minerals (EYE VITAMINS PO)   . Omega-3 Fatty Acids (FISH OIL) 1200 MG CAPS Take by mouth.  Marland Kitchen OVER THE COUNTER MEDICATION Take 120 mg by mouth daily. Ginko Biloba  . rosuvastatin (CRESTOR) 5 MG tablet Take 1/2 (one-half) tablet by mouth once daily  . Saw Palmetto, Serenoa repens, 450 MG CAPS Take 1 capsule by mouth daily.   . tadalafil (CIALIS) 5 MG tablet TAKE 1 TO 4 TABLETS BY MOUTH PRIOR TO INTERCOURSE  . vitamin C (ASCORBIC ACID) 500 MG tablet Take 500 mg by mouth daily.    No facility-administered encounter  medications on file as of 10/05/2019.    Activities of Daily Living In your present state of health, do you have any difficulty performing the following activities: 10/05/2019 09/29/2019  Hearing? N N  Vision? N N  Difficulty concentrating or making decisions? Y N  Comment followed by neurology -  Walking or climbing stairs? N N  Dressing or bathing? N N  Doing errands, shopping? N N  Preparing Food and eating ? N -  Using the Toilet? N -  In the past six months, have you accidently leaked urine? N -  Do you have problems with loss of bowel control? N -  Managing your Medications? N -  Managing your Finances? N -  Housekeeping or managing your Housekeeping? N -  Some recent data might be hidden    Patient Care Team: [name], MD as PCP - General (Family Medicine) Ceasar Mons, MD as Consulting Physician (Urology) Manson Passey, [place] (Specialist) Danella Sensing, MD as Consulting Physician (Dermatology) Ronnald Ramp (Dentistry) Marcial Pacas, MD as Consulting Physician (Neurology)   Assessment:   This is a routine wellness examination for [place].  Exercise Activities and Dietary recommendations Current Exercise Habits: Home exercise routine, Type of exercise: walking, Time (Minutes): 30, Frequency (Times/Week): 4, Weekly Exercise (Minutes/Week): 120, Intensity: Mild  Goals    . Weight (lb) < 200 lb (90.7 kg)     Lose weight by eating healthy and continuing to be active.        Fall Risk Fall Risk  10/05/2019 09/29/2019 11/30/2018 01/13/2018 11/22/2017  Falls in the past year? 0 0 0 No No  Number falls in past yr: 0 0 0 - -  Injury with Fall? 0 0 0 - -  Follow up Falls evaluation completed;Education provided;Falls prevention discussed - Falls evaluation completed - -   Is the patient's home free of loose throw rugs in walkways, pet beds, electrical cords, etc?   yes      Grab bars in the bathroom? yes      Handrails on the stairs?   yes      Adequate lighting?   yes    Depression Screen PHQ 2/9 Scores 10/05/2019 11/30/2018 01/13/2018 11/22/2017  PHQ - 2 Score 0 0 0 0  PHQ- 9 Score - - - -    Cognitive Function- followed by Neurology, patient declined screening today; no changes with medications  MMSE - Mini Mental State Exam 10/05/2019 07/04/2019 08/04/2018 01/13/2018 09/03/2017  Not completed: Refused (No Data) - - -  Orientation to time - 4 5 5 5   Orientation to Place - 4 5 5 5   Registration - 3 3 3 3   Attention/ Calculation - 5 5 5 3   Recall - 3 2 2 2   Language- name 2 objects - 2 2 2 2   Language- repeat - 1 1 1 1   Language- follow 3 step command - 3 3 3 2   Language- read & follow direction - 1 1 1 1   Write a sentence - 1 1 1 1   Copy design - 0 1 1 1   Total score - 27 29 29 26    Montreal Cognitive Assessment  12/26/2018  Visuospatial/ Executive (0/5) 5  Naming (0/3) 3  Attention: Read list of digits (0/2) 2  Attention: Read list of letters (0/1) 1  Attention: Serial 7 subtraction starting at 100 (0/3) 3  Language: Repeat phrase (0/2) 2  Language : Fluency (0/1) 1  Abstraction (0/2) 2  Delayed Recall (0/5) 4  Orientation (0/6) 6  Total 29      Immunization History  Administered Date(s) Administered  . Influenza Split 03/14/2011  . Influenza, High Dose Seasonal PF 02/27/2012, 02/05/2014, 03/10/2015, 04/14/2016, 03/02/2017, 02/23/2018, 01/26/2019, 01/26/2019  . Influenza, Seasonal, Injecte, Preservative Fre 02/27/2013  . Influenza-Unspecified 04/15/2017  . Pneumococcal Conjugate-13 02/05/2014  . Pneumococcal-Unspecified 07/08/2009  . Td 11/15/2007  . Tdap 10/01/2011  . Zoster 06/20/2010  . Zoster Recombinat (Shingrix) 02/05/2018, 04/18/2018  Qualifies for Shingles Vaccine? Shingrix completed   Screening Tests Health Maintenance  Topic Date Due  . COVID-19 Vaccine (1) Never done  . INFLUENZA VACCINE  01/07/2020  . COLONOSCOPY  09/01/2021  . TETANUS/TDAP  09/30/2021  . PNA vac Low Risk Adult  Completed   Cancer Screenings: Lung:  Low Dose CT Chest recommended if Age 80-80 years, 30 pack-year currently smoking OR have quit w/in 15years. Patient does not qualify. Colorectal: No longer indicated; last colonoscopy 09/01/16    Plan:  I have personally reviewed and addressed the Medicare Annual Wellness questionnaire and have noted the following in the patient's chart:  A. Medical and social history B. Use of alcohol, tobacco or illicit drugs  C. Current medications and supplements D. Functional ability and status E.  Nutritional status F.  Physical activity G. Advance directives H. List of other physicians I.  Hospitalizations, surgeries, and ER visits in previous 12 months J.  [place] such as hearing and vision if needed, cognitive and depression L. Referrals, records requested, and appointments- none   In addition, I have reviewed and discussed with patient certain preventive protocols, quality metrics, and best practice recommendations. A written personalized care plan for preventive services as well as general preventive health recommendations were provided to patient.   Signed,  Denman George, LPN  Nurse Health Advisor   Nurse Notes: no additional; patient is still considering Covid vaccine

## 2019-10-05 NOTE — Patient Instructions (Signed)
Mr. Caleb Gibson , Thank you for taking time to come for your Medicare Wellness Visit. I appreciate your ongoing commitment to your health goals. Please review the following plan we discussed and let me know if I can assist you in the future.   Screening recommendations/referrals: Colorectal Screening: No longer indicated; last colonoscopy 09/01/16  Vision and Dental Exams: Recommended annual ophthalmology exams for early detection of glaucoma and other disorders of the eye Recommended annual dental exams for proper oral hygiene  Vaccinations: Influenza vaccine: completed 01/26/19 Pneumococcal vaccine: up to date; last 02/05/14 Tdap vaccine: up to date; last 10/01/11 Shingles vaccine: Shingrix completed  Covid vaccine:  Please consider   Advanced directives: Please bring a copy of your POA (Power of [place]) and/or Living Will to your next appointment.  Goals: Recommend to drink at least 6-8 8oz glasses of water per day and consume a balanced diet rich in fresh fruits and vegetables.   Next appointment: Please schedule your Annual Wellness Visit with your Nurse Health Advisor in one year.  Preventive Care 31 Years and Older, Male Preventive care refers to lifestyle choices and visits with your health care provider that can promote health and wellness. What does preventive care include?  A yearly physical exam. This is also called an annual well check.  Dental exams once or twice a year.  Routine eye exams. Ask your health care provider how often you should have your eyes checked.  Personal lifestyle choices, including:  Daily care of your teeth and gums.  Regular physical activity.  Eating a healthy diet.  Avoiding tobacco and drug use.  Limiting alcohol use.  Practicing safe sex.  Taking low doses of aspirin every day if recommended by your health care provider..  Taking vitamin and mineral supplements as recommended by your health care provider. What happens during an annual  well check? The services and screenings done by your health care provider during your annual well check will depend on your age, overall health, lifestyle risk factors, and family history of disease. Counseling  Your health care provider may ask you questions about your:  Alcohol use.  Tobacco use.  Drug use.  Emotional well-being.  Home and relationship well-being.  Sexual activity.  Eating habits.  History of falls.  Memory and ability to understand (cognition).  Work and work Statistician. Screening  You may have the following tests or measurements:  Height, weight, and BMI.  Blood pressure.  Lipid and cholesterol levels. These may be checked every 5 years, or more frequently if you are over 55 years old.  Skin check.  Lung cancer screening. You may have this screening every year starting at age 2 if you have a 30-pack-year history of smoking and currently smoke or have quit within the past 15 years.  Fecal occult blood test (FOBT) of the stool. You may have this test every year starting at age 13.  Flexible sigmoidoscopy or colonoscopy. You may have a sigmoidoscopy every 5 years or a colonoscopy every 10 years starting at age 41.  Prostate cancer screening. Recommendations will vary depending on your family history and other risks.  Hepatitis C blood test.  Hepatitis B blood test.  Sexually transmitted disease (STD) testing.  Diabetes screening. This is done by checking your blood sugar (glucose) after you have not eaten for a while (fasting). You may have this done every 1-3 years.  Abdominal aortic aneurysm (AAA) screening. You may need this if you are a current or former smoker.  Osteoporosis.  You may be screened starting at age 37 if you are at high risk. Talk with your health care provider about your test results, treatment options, and if necessary, the need for more tests. Vaccines  Your health care provider may recommend certain vaccines, such  as:  Influenza vaccine. This is recommended every year.  Tetanus, diphtheria, and acellular pertussis (Tdap, Td) vaccine. You may need a Td booster every 10 years.  Zoster vaccine. You may need this after age 93.  Pneumococcal 13-valent conjugate (PCV13) vaccine. One dose is recommended after age 18.  Pneumococcal polysaccharide (PPSV23) vaccine. One dose is recommended after age 95. Talk to your health care provider about which screenings and vaccines you need and how often you need them. This information is not intended to replace advice given to you by your health care provider. Make sure you discuss any questions you have with your health care provider. Document Released: 06/21/2015 Document Revised: 02/12/2016 Document Reviewed: 03/26/2015 Elsevier Interactive Patient Education  2017 [place] Prevention in the Home Falls can cause injuries. They can happen to people of all ages. There are many things you can do to make your home safe and to help prevent falls. What can I do on the outside of my home?  Regularly fix the edges of walkways and driveways and fix any cracks.  Remove anything that might make you trip as you walk through a door, such as a raised step or threshold.  Trim any bushes or trees on the path to your home.  Use bright outdoor lighting.  Clear any walking paths of anything that might make someone trip, such as rocks or tools.  Regularly check to see if handrails are loose or broken. Make sure that both sides of any steps have handrails.  Any raised decks and porches should have guardrails on the edges.  Have any leaves, snow, or ice cleared regularly.  Use sand or salt on walking paths during winter.  Clean up any spills in your garage right away. This includes oil or grease spills. What can I do in the bathroom?  Use night lights.  Install grab bars by the toilet and in the tub and shower. Do not use towel bars as grab bars.  Use  non-skid mats or decals in the tub or shower.  If you need to sit down in the shower, use a plastic, non-slip stool.  Keep the floor dry. Clean up any water that spills on the floor as soon as it happens.  Remove soap buildup in the tub or shower regularly.  Attach bath mats securely with double-sided non-slip rug tape.  Do not have throw rugs and other things on the floor that can make you trip. What can I do in the bedroom?  Use night lights.  Make sure that you have a light by your bed that is easy to reach.  Do not use any sheets or blankets that are too big for your bed. They should not hang down onto the floor.  Have a firm chair that has side arms. You can use this for support while you get dressed.  Do not have throw rugs and other things on the floor that can make you trip. What can I do in the kitchen?  Clean up any spills right away.  Avoid walking on wet floors.  Keep items that you use a lot in easy-to-reach places.  If you need to reach something above you, use a strong step stool  that has a grab bar.  Keep electrical cords out of the way.  Do not use floor polish or wax that makes floors slippery. If you must use wax, use non-skid floor wax.  Do not have throw rugs and other things on the floor that can make you trip. What can I do with my stairs?  Do not leave any items on the stairs.  Make sure that there are handrails on both sides of the stairs and use them. Fix handrails that are broken or loose. Make sure that handrails are as long as the stairways.  Check any carpeting to make sure that it is firmly attached to the stairs. Fix any carpet that is loose or worn.  Avoid having throw rugs at the top or bottom of the stairs. If you do have throw rugs, attach them to the floor with carpet tape.  Make sure that you have a light switch at the top of the stairs and the bottom of the stairs. If you do not have them, ask someone to add them for you. What  else can I do to help prevent falls?  Wear shoes that:  Do not have high heels.  Have rubber bottoms.  Are comfortable and fit you well.  Are closed at the toe. Do not wear sandals.  If you use a stepladder:  Make sure that it is fully opened. Do not climb a closed stepladder.  Make sure that both sides of the stepladder are locked into place.  Ask someone to hold it for you, if possible.  Clearly mark and make sure that you can see:  Any grab bars or handrails.  First and last steps.  Where the edge of each step is.  Use tools that help you move around (mobility aids) if they are needed. These include:  Canes.  Walkers.  Scooters.  Crutches.  Turn on the lights when you go into a dark area. Replace any light bulbs as soon as they burn out.  Set up your furniture so you have a clear path. Avoid moving your furniture around.  If any of your floors are uneven, fix them.  If there are any pets around you, be aware of where they are.  Review your medicines with your doctor. Some medicines can make you feel dizzy. This can increase your chance of falling. Ask your doctor what other things that you can do to help prevent falls. This information is not intended to replace advice given to you by your health care provider. Make sure you discuss any questions you have with your health care provider. Document Released: 03/21/2009 Document Revised: 10/31/2015 Document Reviewed: 06/29/2014 Elsevier Interactive Patient Education  2017 [employer].

## 2019-11-20 ENCOUNTER — Telehealth: Payer: Self-pay | Admitting: Family Medicine

## 2019-11-20 ENCOUNTER — Other Ambulatory Visit: Payer: Self-pay

## 2019-11-20 ENCOUNTER — Encounter: Payer: Self-pay | Admitting: Family Medicine

## 2019-11-20 ENCOUNTER — Ambulatory Visit (INDEPENDENT_AMBULATORY_CARE_PROVIDER_SITE_OTHER): Payer: Medicare HMO | Admitting: Family Medicine

## 2019-11-20 DIAGNOSIS — I950 Idiopathic hypotension: Secondary | ICD-10-CM | POA: Diagnosis not present

## 2019-11-20 MED ORDER — FLUDROCORTISONE ACETATE 0.1 MG PO TABS: 0.1000 mg | ORAL_TABLET | Freq: Every day | ORAL | 2 refills | Status: DC

## 2019-11-20 NOTE — Progress Notes (Signed)
Subjective  CC:  Chief Complaint  Patient presents with  . Hypotension    HPI: Caleb Gibson is a 79 y.o. male who presents to the office today to address the problems listed above in the chief complaint.  79 yo with long standing low bps at baseline and with idiopathic orthostatic hypotension with neg cardiac stress echo 2016 and neg endocrine eval in 2017 for adrenal insufficiency; presents due to sxs of lightheadedness with exertion at times. Intermittent. Walks daily. Exercises at [employer]. At times, feels lightheaded after climbing stairs in home. Also felt presyncopal while standing and singing at church the other day. Noted some sxs while working out in the garage - admits sxs often occur with heat and working. No cp or palpitations. Eats well - following a low carb diet for weight loss. Energy is otherwise good. No h/o low thyroid.   Lab Results  Component Value Date   TSH 1.31 06/20/2019   Lab Results  Component Value Date   WBC 4.2 06/20/2019   HGB 13.3 06/20/2019   HCT 39.0 06/20/2019   MCV 94.7 06/20/2019   PLT 178.0 06/20/2019     Assessment  1. Idiopathic hypotension      Plan   Low BP:  Unclear if sxs related to low bp as his log is stable over time and sxs don't necessarily correlate to low bp readings; however, recommend increasing hydration and salt; add florinef if needed.   Follow up: recheck/6 mo f/u  12/22/2019  No orders of the defined types were placed in this encounter.  Meds ordered this encounter  Medications  . fludrocortisone (FLORINEF) 0.1 MG tablet    Sig: Take 1 tablet (0.1 mg total) by mouth daily.    Dispense:  30 tablet    Refill:  2      I reviewed the patients updated PMH, FH, and SocHx.    Patient Active Problem List   Diagnosis Date Noted  . Mild cognitive impairment 05/10/2018    Priority: High  . Prostate cancer ([place]) 11/22/2017    Priority: High  . Tubular adenoma of colon 06/15/2017    Priority: High  . On statin therapy due  to risk of future cardiovascular event 10/08/2015    Priority: High  . Mixed hyperlipidemia 09/27/2014    Priority: High  . Idiopathic hypotension 01/07/2016    Priority: Medium  . BPH (benign prostatic hyperplasia) 10/01/2011    Priority: Medium  . Erectile dysfunction 10/01/2011    Priority: Medium  . Osteoarthritis, knee 08/01/2010    Priority: Medium  . Claustrophobia 07/08/2009    Priority: Medium  . Basal cell carcinoma of skin 06/15/2017    Priority: Low  . Simple cyst of kidney 11/12/2011    Priority: Low  . Allergic rhinitis 08/27/2011    Priority: Low  . History of traumatic head injury 08/04/2018  . Memory loss 08/04/2018   Current Meds  Medication Sig  . Amino Acids (L-CARNITINE PO) Take 250 mg by mouth daily.   Marland Kitchen CALCIUM CITRATE PO Take 600 mg by mouth daily.   . cholecalciferol (VITAMIN D) 1000 units tablet Take 1,000 Units by mouth daily.   . citalopram (CELEXA) 10 MG tablet Take 1 tablet (10 mg total) by mouth daily.  . clonazePAM (KLONOPIN) 0.5 MG tablet TAKE 1 TABLET BY MOUTH AT BEDTIME AS NEEDED FOR ANXIETY  . donepezil (ARICEPT) 10 MG tablet Take 1 tablet (10 mg total) by mouth daily.  . Fluticasone Propionate (FLONASE NA)  Place into the nose.   Marland Kitchen FLUZONE HIGH-DOSE QUADRIVALENT 0.7 ML SUSY Inject 0.7 mLs into the muscle once.   Marland Kitchen GARCINIA CAMBOGIA-CHROMIUM PO Take 1 tablet by mouth daily.   Marland Kitchen glucosamine-chondroitin 500-400 MG tablet Take 1 tablet by mouth daily.   . Lycopene 10 MG CAPS Take 1 capsule by mouth daily.   . memantine (NAMENDA) 10 MG tablet Take 1 tablet (10 mg total) by mouth 2 (two) times daily.  . Multiple Vitamins-Minerals (CENTRUM SILVER) tablet Take 1 tablet by mouth daily.   . Multiple Vitamins-Minerals (EYE VITAMINS PO)   . Omega-3 Fatty Acids (FISH OIL) 1200 MG CAPS Take by mouth.   Marland Kitchen OVER THE COUNTER MEDICATION Take 120 mg by mouth daily. Ginko Biloba   . rosuvastatin (CRESTOR) 5 MG tablet Take 1/2 (one-half) tablet by mouth once  daily  . Saw Palmetto, Serenoa repens, 450 MG CAPS Take 1 capsule by mouth daily.   . tadalafil (CIALIS) 5 MG tablet TAKE 1 TO 4 TABLETS BY MOUTH PRIOR TO INTERCOURSE  . vitamin C (ASCORBIC ACID) 500 MG tablet Take 500 mg by mouth daily.     Allergies: Patient is allergic to viagra [sildenafil citrate]. Family History: Patient family history includes Alcohol abuse in his maternal aunt and mother; Cirrhosis in his maternal aunt; Diabetes in his father; Glaucoma in his father; Heart disease in his maternal grandmother; Lymphoma in his mother; Melanoma in his mother; Prostate cancer in his father. Social History:  Patient  reports that he quit smoking about 25 years ago. His smoking use included cigarettes. He has never used smokeless tobacco. He reports current alcohol use. He reports that he does not use drugs.  Review of Systems: Constitutional: Negative for fever malaise or anorexia Cardiovascular: negative for chest pain Respiratory: negative for SOB or persistent cough Gastrointestinal: negative for abdominal pain  Objective  Vitals: BP (!) 93/56   Pulse 70   Temp (!) 97.3 F (36.3 C) (Temporal)   Ht 5\' 10"  (1.778 m)   Wt 216 lb (98 kg)   SpO2 96%   BMI 30.99 kg/m  General: no acute distress , A&Ox3 HEENT: PEERL, conjunctiva normal, neck is supple Cardiovascular:  RRR without murmur or gallop.  Respiratory:  Good breath sounds bilaterally, CTAB with normal respiratory effort Skin:  Warm, no rashes  Neg orthostatics today but Bps are low.    Commons side effects, risks, benefits, and alternatives for medications and treatment plan prescribed today were discussed, and the patient expressed understanding of the given instructions. Patient is instructed to call or message via MyChart if he/she has any questions or concerns regarding our treatment plan. No barriers to understanding were identified. We discussed Red Flag symptoms and signs in detail. Patient expressed understanding  regarding what to do in case of urgent or emergency type symptoms.   Medication list was reconciled, printed and provided to the patient in AVS. Patient instructions and summary information was reviewed with the patient as documented in the AVS. This note was prepared with assistance of Dragon voice recognition software. Occasional wrong-word or sound-a-like substitutions may have occurred due to the inherent limitations of voice recognition software  This visit occurred during the SARS-CoV-2 public health emergency.  Safety protocols were in place, including screening questions prior to the visit, additional usage of staff PPE, and extensive cleaning of exam room while observing appropriate contact time as indicated for disinfecting solutions.

## 2019-11-20 NOTE — Telephone Encounter (Signed)
Nurse Assessment Nurse: Hardin Negus, RN, Mardene Celeste Date/Time Eilene Ghazi Time): 11/20/2019 10:49:36 AM Confirm and document reason for call. If symptomatic, describe symptoms. ---He has been having low blood pressure for the last month with his current BP 107/57. Its been as low as 90/49 which was on 06/11. He does get dizziness when he stands or does something swift and affects his balance. Caller said as of right now while we are on the phone he feels okay. Caller wanted to include more recent BP numbers of 67/47, 91/85 and 133/65 in feb which He also has a mild headache and he is not even wanting to play golf. Has the patient had close contact with a person known or suspected to have the novel coronavirus illness OR traveled / lives in area with major community spread (including international travel) in the last 14 days from the onset of symptoms? * If Asymptomatic, screen for exposure and travel within the last 14 days. ---No Does the patient have any new or worsening symptoms? ---Yes Will a triage be completed? ---Yes Related visit to physician within the last 2 weeks? ---No Does the PT have any chronic conditions? (i.e. diabetes, asthma, this includes High risk factors for pregnancy, etc.) ---Yes List chronic conditions. ---cholsteral Is this a behavioral health or substance abuse call? ---NoPLEASE NOTE: All timestamps contained within this report are represented as [place] Standard Time. CONFIDENTIALTY NOTICE: This fax transmission is intended only for the addressee. It contains information that is legally privileged, confidential or otherwise protected from use or disclosure. If you are not the intended recipient, you are strictly prohibited from reviewing, disclosing, copying using or disseminating any of this information or taking any action in reliance on or regarding this information. If you have received this fax in error, please notify us immediately by telephone so that we can  arrange for its return to [place]. Phone: [phone], Toll-Free: [phone], Fax: [phone] Page: 2 of 2 Call Id: [id number] Guidelines Guideline Title Affirmed Question Affirmed Notes Nurse Date/Time Eilene Ghazi Time) Blood Pressure - Low [3] Systolic BP < 90 AND [4] NOT dizzy, lightheaded or weak Oren Bracket 11/20/2019 10:51:51 AM Disp. Time Eilene Ghazi Time) Disposition Final User 11/20/2019 10:57:58 AM See HCP within 4 Hours (or PCP triage) Yes Hardin Negus, RN, [place] Disagree/Comply Comply Caller Understands Yes PreDisposition Call Doctor Care Advice Given Per Guideline SEE HCP WITHIN 4 HOURS (OR PCP TRIAGE): CALL BACK IF: * You become wors

## 2019-11-20 NOTE — Patient Instructions (Signed)
Please follow up if symptoms do not improve or as needed.   

## 2019-11-21 ENCOUNTER — Encounter: Payer: Self-pay | Admitting: Family Medicine

## 2019-11-21 ENCOUNTER — Ambulatory Visit: Payer: Medicare HMO | Admitting: Family Medicine

## 2019-12-19 ENCOUNTER — Encounter: Payer: Medicare HMO | Admitting: Family Medicine

## 2019-12-22 ENCOUNTER — Encounter: Payer: Medicare HMO | Admitting: Family Medicine

## 2019-12-28 ENCOUNTER — Other Ambulatory Visit: Payer: Self-pay

## 2019-12-28 ENCOUNTER — Encounter: Payer: Self-pay | Admitting: Family Medicine

## 2019-12-28 ENCOUNTER — Telehealth: Payer: Self-pay | Admitting: Family Medicine

## 2019-12-28 ENCOUNTER — Ambulatory Visit (HOSPITAL_COMMUNITY)
Admission: RE | Admit: 2019-12-28 | Discharge: 2019-12-28 | Disposition: A | Discharge status: Home / Self Care | Payer: Medicare HMO | Source: Ambulatory Visit | Attending: Family Medicine | Admitting: Family Medicine

## 2019-12-28 ENCOUNTER — Ambulatory Visit (INDEPENDENT_AMBULATORY_CARE_PROVIDER_SITE_OTHER): Payer: Medicare HMO | Admitting: Family Medicine

## 2019-12-28 VITALS — BP 112/60 | HR 58 | Temp 98.7°F | Ht 70.0 in | Wt 220.2 lb

## 2019-12-28 DIAGNOSIS — M7989 Other specified soft tissue disorders: Secondary | ICD-10-CM

## 2019-12-28 MED ORDER — RIVAROXABAN 10 MG PO TABS: 10.0000 mg | ORAL_TABLET | Freq: Every day | ORAL | 0 refills | Status: DC

## 2019-12-28 NOTE — Patient Instructions (Signed)
-  stat ultrasound to rule out DVT. Ill be in touch.

## 2019-12-28 NOTE — Progress Notes (Signed)
Patient: Caleb Gibson MRN: [id number] DOB: Nov 21, 1940 PCP: [name], MD     Subjective:  Chief Complaint  Patient presents with  . Joint Swelling    Ankle    HPI: The patient is a 79 y.o. male who presents today for ankle pain. Pt notice a couple of weeks ago. He got up one morning and his leg felt funny on the anterior aspect of his left ankle. It felt dry and tight. It was red, swollen and warm to touch. Today it is not as red, but the swelling is worse that it was this AM. Denies any trauma to the leg. He did take a log car ride last week. 8 hours there and 7 hours coming back. He thinks he had the swelling and redness before he went on the trip. He had radiation for prostate in December of this past year.    Review of Systems  Constitutional: Negative for diaphoresis, fatigue and fever.  Respiratory: Negative for cough and shortness of breath.   Cardiovascular: Positive for leg swelling. Negative for chest pain and palpitations.  Gastrointestinal: Negative for abdominal pain, nausea and vomiting.  Genitourinary: Negative for frequency and urgency.    Allergies Patient is allergic to viagra [sildenafil citrate].  Past Medical History Patient  has a past medical history of Anxiety, Basal cell carcinoma of skin, Claustrophobia, High cholesterol, Memory loss, Night terrors, Prostate cancer ([place]), Sleep walking, and Tubular adenoma of colon (06/15/2017).  Surgical History Patient  has a past surgical history that includes Appendectomy; Dental surgery; Rhinoplasty; Tonsillectomy and adenoidectomy; Mohs surgery; and Knee arthroscopy with meniscal repair (Right).  Family History [name]'s family history includes Alcohol abuse in his maternal aunt and mother; Cirrhosis in his maternal aunt; Diabetes in his father; Glaucoma in his father; Heart disease in his maternal grandmother; Lymphoma in his mother; Melanoma in his mother; Prostate cancer in his father.  Social  History Patient  reports that he quit smoking about 25 years ago. His smoking use included cigarettes. He has never used smokeless tobacco. He reports current alcohol use. He reports that he does not use drugs.    Objective: Vitals:   12/28/19 1301  BP: (!) 112/60  Pulse: 58  Temp: 98.7 F (37.1 C)  TempSrc: Temporal  SpO2: 96%  Weight: (!) 220 lb 3.2 oz (99.9 kg)  Height: 5\' 10"  (1.778 m)    Body mass index is 31.6 kg/m.  Physical Exam Vitals reviewed.  Constitutional:      Appearance: Normal appearance. He is obese.  HENT:     Head: Normocephalic and atraumatic.  Cardiovascular:     Rate and Rhythm: Normal rate and regular rhythm.     Heart sounds: Normal heart sounds.  Pulmonary:     Effort: Pulmonary effort is normal.     Breath sounds: Normal breath sounds.  Abdominal:     General: Bowel sounds are normal.     Palpations: Abdomen is soft.  Musculoskeletal:     Left lower leg: Edema present.     Comments: Right calf circumference: 16.5" Left calf circumference: about 17" His left lower leg is edematous from below the knee into food. No pitting edema and no erythema. He does have a large palpable cord in calf that is quite extensive. Negative homan sign.   Skin:    Capillary Refill: Capillary refill takes less than 2 seconds.  Neurological:     General: No focal deficit present.     Mental Status: He is alert.  Assessment/plan: 1. Left leg swelling Stat leg ultrasound and labs ordered as well for swelling. Concern for clot.  Ultrasound: NO DVT. He does have a large SVT in great saphenous vein >5cm. With cancer hx and extent of clot in great saphenous would be intermediate risk for DVT. anticoagulating with xarelto 10mg  x 45 days. Called and discussed results with boht patient and his wife. Also recommended walking, cool or warm compresses and compression hose if he can tolerate. Will have him follow up with dr. Jonni Sanger in a few weeks as well. precautions given  and fall precautions given while on xarelto.   - CBC with Differential/Platelet; Future - Comprehensive metabolic panel; Future - Brain natriuretic peptide; Future - TSH; Future - Uric acid; Future - VAS [place] LOWER EXTREMITY VENOUS (DVT); Future - VAS [place] LOWER EXTREMITY VENOUS (DVT); Future - Uric acid - TSH - Brain natriuretic peptide - Comprehensive metabolic panel - CBC with Differential/Platelet     This visit occurred during the SARS-CoV-2 public health emergency.  Safety protocols were in place, including screening questions prior to the visit, additional usage of staff PPE, and extensive cleaning of exam room while observing appropriate contact time as indicated for disinfecting solutions.     Return in about 2 weeks (around 01/11/2020) for superficial blood clot .   Orma Flaming, MD [place]   12/28/2019

## 2019-12-28 NOTE — Telephone Encounter (Signed)
No prior Josem Kaufmann is needed for US DVT.

## 2019-12-29 ENCOUNTER — Other Ambulatory Visit: Payer: Self-pay | Admitting: Neurology

## 2019-12-29 LAB — COMPREHENSIVE METABOLIC PANEL
AG Ratio: 2.4 (calc) (ref 1.0–2.5)
ALT: 9 U/L (ref 9–46)
AST: 11 U/L (ref 10–35)
Albumin: 4.1 g/dL (ref 3.6–5.1)
Alkaline phosphatase (APISO): 58 U/L (ref 35–144)
BUN: 16 mg/dL (ref 7–25)
CO2: 28 mmol/L (ref 20–32)
Calcium: 9.3 mg/dL (ref 8.6–10.3)
Chloride: 109 mmol/L (ref 98–110)
Creat: 0.86 mg/dL (ref 0.70–1.18)
Globulin: 1.7 g/dL (calc) — ABNORMAL LOW (ref 1.9–3.7)
Glucose, Bld: 103 mg/dL — ABNORMAL HIGH (ref 65–99)
Potassium: 4 mmol/L (ref 3.5–5.3)
Sodium: 141 mmol/L (ref 135–146)
Total Bilirubin: 0.4 mg/dL (ref 0.2–1.2)
Total Protein: 5.8 g/dL — ABNORMAL LOW (ref 6.1–8.1)

## 2019-12-29 LAB — CBC WITH DIFFERENTIAL/PLATELET
Absolute Monocytes: 352 cells/uL (ref 200–950)
Basophils Absolute: 19 cells/uL (ref 0–200)
Basophils Relative: 0.5 %
Eosinophils Absolute: 81 cells/uL (ref 15–500)
Eosinophils Relative: 2.2 %
HCT: 36.2 % — ABNORMAL LOW (ref 38.5–50.0)
Hemoglobin: 11.8 g/dL — ABNORMAL LOW (ref 13.2–17.1)
Lymphs Abs: 596 cells/uL — ABNORMAL LOW (ref 850–3900)
MCH: 31.8 pg (ref 27.0–33.0)
MCHC: 32.6 g/dL (ref 32.0–36.0)
MCV: 97.6 fL (ref 80.0–100.0)
MPV: 10.7 fL (ref 7.5–12.5)
Monocytes Relative: 9.5 %
Neutro Abs: 2653 cells/uL (ref 1500–7800)
Neutrophils Relative %: 71.7 %
Platelets: 171 10*3/uL (ref 140–400)
RBC: 3.71 10*6/uL — ABNORMAL LOW (ref 4.20–5.80)
RDW: 12.4 % (ref 11.0–15.0)
Total Lymphocyte: 16.1 %
WBC: 3.7 10*3/uL — ABNORMAL LOW (ref 3.8–10.8)

## 2019-12-29 LAB — BRAIN NATRIURETIC PEPTIDE: Brain Natriuretic Peptide: 118 pg/mL — ABNORMAL HIGH (ref <100)

## 2019-12-29 LAB — URIC ACID: Uric Acid, Serum: 4.6 mg/dL (ref 4.0–8.0)

## 2019-12-29 LAB — TSH: TSH: 1 mIU/L (ref 0.40–4.50)

## 2020-01-22 ENCOUNTER — Encounter: Payer: Self-pay | Admitting: Family Medicine

## 2020-01-22 ENCOUNTER — Other Ambulatory Visit: Payer: Self-pay

## 2020-01-22 ENCOUNTER — Ambulatory Visit (INDEPENDENT_AMBULATORY_CARE_PROVIDER_SITE_OTHER): Payer: Medicare HMO | Admitting: Family Medicine

## 2020-01-22 VITALS — BP 110/68 | HR 68 | Temp 97.9°F | Resp 15 | Wt 216.8 lb

## 2020-01-22 DIAGNOSIS — D649 Anemia, unspecified: Secondary | ICD-10-CM

## 2020-01-22 DIAGNOSIS — R5383 Other fatigue: Secondary | ICD-10-CM | POA: Diagnosis not present

## 2020-01-22 DIAGNOSIS — I82812 Embolism and thrombosis of superficial veins of left lower extremities: Secondary | ICD-10-CM

## 2020-01-22 DIAGNOSIS — G3184 Mild cognitive impairment, so stated: Secondary | ICD-10-CM | POA: Diagnosis not present

## 2020-01-22 DIAGNOSIS — C61 Malignant neoplasm of prostate: Secondary | ICD-10-CM

## 2020-01-22 DIAGNOSIS — F40240 Claustrophobia: Secondary | ICD-10-CM

## 2020-01-22 MED ORDER — CITALOPRAM HYDROBROMIDE 10 MG PO TABS: 5.0000 mg | ORAL_TABLET | Freq: Every day | ORAL | 3 refills | Status: DC

## 2020-01-22 NOTE — Progress Notes (Signed)
Subjective  CC:  Chief Complaint  Patient presents with  . SVT follow up    left foot slightly swollen and some redness    HPI: Caleb Gibson is a 79 y.o. male who presents to the office today to address the problems listed above in the chief complaint.  F/u Superficial venous thrombolism of Gr saph vein now on xarelto and doing fine. No pain. Still with some discoloration and minimal swelling. Reviewed ultrasound report. Provoked, long car drive and has h/o prostate CA s/p radiation tx in December.   F/u normocytic anemia: new. Feels like he has decreased energy and struggling more with memory. No melena. Nl TSH last month and cmp, has f/u with urology next week for f/u prostate cacner.   MCI and claustrophobia on celexa for years; not sure it is helpful but worries the med may be contributing to him feeling tired, harder to remember or focus and would like to come off it if possible.   Prostate CA: has f/u soon with urology.   No visits with results within 1 Day(s) from this visit.  Latest known visit with results is:  Office Visit on 12/28/2019  Component Date Value Ref Range Status  . Uric Acid, Serum 12/28/2019 4.6  4.0 - 8.0 mg/dL Final  . TSH 12/28/2019 1.00  0.40 - 4.50 mIU/L Final  . Brain Natriuretic Peptide 12/28/2019 118* <100 pg/mL Final  . Glucose, Bld 12/28/2019 103* 65 - 99 mg/dL Final  . BUN 12/28/2019 16  7 - 25 mg/dL Final  . Creat 12/28/2019 0.86  0.70 - 1.18 mg/dL Final  . BUN/Creatinine Ratio 32/99/2426 NOT APPLICABLE  6 - 22 (calc) Final  . Sodium 12/28/2019 141  135 - 146 mmol/L Final  . Potassium 12/28/2019 4.0  3.5 - 5.3 mmol/L Final  . Chloride 12/28/2019 109  98 - 110 mmol/L Final  . CO2 12/28/2019 28  20 - 32 mmol/L Final  . Calcium 12/28/2019 9.3  8.6 - 10.3 mg/dL Final  . Total Protein 12/28/2019 5.8* 6.1 - 8.1 g/dL Final  . Albumin 12/28/2019 4.1  3.6 - 5.1 g/dL Final  . Globulin 12/28/2019 1.7* 1.9 - 3.7 g/dL (calc) Final  . AG Ratio 12/28/2019  2.4  1.0 - 2.5 (calc) Final  . Total Bilirubin 12/28/2019 0.4  0.2 - 1.2 mg/dL Final  . Alkaline phosphatase (APISO) 12/28/2019 58  35 - 144 U/L Final  . AST 12/28/2019 11  10 - 35 U/L Final  . ALT 12/28/2019 9  9 - 46 U/L Final  . WBC 12/28/2019 3.7* 3.8 - 10.8 Thousand/uL Final  . RBC 12/28/2019 3.71* 4.20 - 5.80 Million/uL Final  . Hemoglobin 12/28/2019 11.8* 13.2 - 17.1 g/dL Final  . HCT 12/28/2019 36.2* 38 - 50 % Final  . MCV 12/28/2019 97.6  80.0 - 100.0 fL Final  . MCH 12/28/2019 31.8  27.0 - 33.0 pg Final  . MCHC 12/28/2019 32.6  32.0 - 36.0 g/dL Final  . RDW 12/28/2019 12.4  11.0 - 15.0 % Final  . Platelets 12/28/2019 171  140 - 400 Thousand/uL Final  . MPV 12/28/2019 10.7  7.5 - 12.5 fL Final  . Neutro Abs 12/28/2019 2,653  1,500 - 7,800 cells/uL Final  . Lymphs Abs 12/28/2019 596* 850 - 3,900 cells/uL Final  . Absolute Monocytes 12/28/2019 352  200 - 950 cells/uL Final  . Eosinophils Absolute 12/28/2019 81  15 - 500 cells/uL Final  . Basophils Absolute 12/28/2019 19  0 - 200 cells/uL  Final  . Neutrophils Relative % 12/28/2019 71.7  % Final  . Total Lymphocyte 12/28/2019 16.1  % Final  . Monocytes Relative 12/28/2019 9.5  % Final  . Eosinophils Relative 12/28/2019 2.2  % Final  . Basophils Relative 12/28/2019 0.5  % Final    Assessment  1. Greater saphenous vein embolism, left   2. Normocytic anemia   3. Fatigue, unspecified type   4. Mild cognitive impairment   5. Claustrophobia   6. Prostate cancer ([place])      Plan   Superficial venous thromboembolism, Gr saph vein, left:  On 45 days of anticoagulation. Provoked. But has h/o prostate cancer. Monitor. Discussed prevention.   Anemia: start work up.   Fatigue and cognitive changes: MCI followed by neuro on namenda. Wean celexa to see if helps. Monitor claustrophobia and anxiety sxs.  Prostate cancer: treated and has f/u soon.   Follow up:  Cpe in January 06/25/2020  Orders Placed This Encounter  Procedures  .  CBC with Differential/Platelet  . Iron, TIBC and Ferritin Panel  . B12 and Folate Panel  . Reticulocytes   Meds ordered this encounter  Medications  . citalopram (CELEXA) 10 MG tablet    Sig: Take 0.5 tablets (5 mg total) by mouth daily.    Dispense:  90 tablet    Refill:  3      I reviewed the patients updated PMH, FH, and SocHx.    Patient Active Problem List   Diagnosis Date Noted  . Mild cognitive impairment 05/10/2018    Priority: High  . Prostate cancer ([place]) 11/22/2017    Priority: High  . Tubular adenoma of colon 06/15/2017    Priority: High  . On statin therapy due to risk of future cardiovascular event 10/08/2015    Priority: High  . Mixed hyperlipidemia 09/27/2014    Priority: High  . Idiopathic hypotension 01/07/2016    Priority: Medium  . BPH (benign prostatic hyperplasia) 10/01/2011    Priority: Medium  . Erectile dysfunction 10/01/2011    Priority: Medium  . Osteoarthritis, knee 08/01/2010    Priority: Medium  . Claustrophobia 07/08/2009    Priority: Medium  . Basal cell carcinoma of skin 06/15/2017    Priority: Low  . Simple cyst of kidney 11/12/2011    Priority: Low  . Allergic rhinitis 08/27/2011    Priority: Low  . History of traumatic head injury 08/04/2018  . Memory loss 08/04/2018   Current Meds  Medication Sig  . Amino Acids (L-CARNITINE PO) Take 250 mg by mouth daily.   Marland Kitchen CALCIUM CITRATE PO Take 600 mg by mouth daily.   . cholecalciferol (VITAMIN D) 1000 units tablet Take 1,000 Units by mouth daily.   . citalopram (CELEXA) 10 MG tablet Take 0.5 tablets (5 mg total) by mouth daily.  . clonazePAM (KLONOPIN) 0.5 MG tablet TAKE 1 TABLET BY MOUTH AT BEDTIME AS NEEDED FOR ANXIETY  . donepezil (ARICEPT) 10 MG tablet Take 1 tablet (10 mg total) by mouth daily.  . fludrocortisone (FLORINEF) 0.1 MG tablet Take 1 tablet (0.1 mg total) by mouth daily.  . Fluticasone Propionate (FLONASE NA) Place into the nose.   Marland Kitchen GARCINIA CAMBOGIA-CHROMIUM PO Take  1 tablet by mouth daily.   Marland Kitchen glucosamine-chondroitin 500-400 MG tablet Take 1 tablet by mouth daily.   . Lycopene 10 MG CAPS Take 1 capsule by mouth daily.   . memantine (NAMENDA) 10 MG tablet Take 1 tablet by mouth twice daily  . Multiple Vitamins-Minerals (CENTRUM  SILVER) tablet Take 1 tablet by mouth daily.   . Multiple Vitamins-Minerals (EYE VITAMINS PO)   . Omega-3 Fatty Acids (FISH OIL) 1200 MG CAPS Take by mouth.   . rivaroxaban (XARELTO) 10 MG TABS tablet Take 1 tablet (10 mg total) by mouth daily.  . rosuvastatin (CRESTOR) 5 MG tablet Take 1/2 (one-half) tablet by mouth once daily  . Saw Palmetto, Serenoa repens, 450 MG CAPS Take 1 capsule by mouth daily.   . tadalafil (CIALIS) 5 MG tablet TAKE 1 TO 4 TABLETS BY MOUTH PRIOR TO INTERCOURSE  . tamsulosin (FLOMAX) 0.4 MG CAPS capsule   . vitamin C (ASCORBIC ACID) 500 MG tablet Take 500 mg by mouth daily.   . [DISCONTINUED] citalopram (CELEXA) 10 MG tablet Take 1 tablet (10 mg total) by mouth daily.    Allergies: Patient is allergic to viagra [sildenafil citrate]. Family History: Patient family history includes Alcohol abuse in his maternal aunt and mother; Cirrhosis in his maternal aunt; Diabetes in his father; Glaucoma in his father; Heart disease in his maternal grandmother; Lymphoma in his mother; Melanoma in his mother; Prostate cancer in his father. Social History:  Patient  reports that he quit smoking about 25 years ago. His smoking use included cigarettes. He has never used smokeless tobacco. He reports current alcohol use. He reports that he does not use drugs.  Review of Systems: Constitutional: Negative for fever malaise or anorexia Cardiovascular: negative for chest pain Respiratory: negative for SOB or persistent cough Gastrointestinal: negative for abdominal pain  Objective  Vitals: BP 110/68   Pulse 68   Temp 97.9 F (36.6 C) (Temporal)   Resp 15   Wt 216 lb 12.8 oz (98.3 kg)   SpO2 96%   BMI 31.11 kg/m   General: no acute distress , A&Ox3 HEENT: PEERL, conjunctiva normal, neck is supple Left leg: increased redness, mild nonpitting edema. nontender     Commons side effects, risks, benefits, and alternatives for medications and treatment plan prescribed today were discussed, and the patient expressed understanding of the given instructions. Patient is instructed to call or message via MyChart if he/she has any questions or concerns regarding our treatment plan. No barriers to understanding were identified. We discussed Red Flag symptoms and signs in detail. Patient expressed understanding regarding what to do in case of urgent or emergency type symptoms.   Medication list was reconciled, printed and provided to the patient in AVS. Patient instructions and summary information was reviewed with the patient as documented in the AVS. This note was prepared with assistance of Dragon voice recognition software. Occasional wrong-word or sound-a-like substitutions may have occurred due to the inherent limitations of voice recognition software  This visit occurred during the SARS-CoV-2 public health emergency.  Safety protocols were in place, including screening questions prior to the visit, additional usage of staff PPE, and extensive cleaning of exam room while observing appropriate contact time as indicated for disinfecting solutions.

## 2020-01-22 NOTE — Patient Instructions (Signed)
Please return in January 2022 for your annual complete physical; please come fasting.  You have been on the statin for several years, so I don't think it is causing your symptoms.  Cut the citalopram in half for about 4 weeks, and then go to every other day for 2 weeks, then stop it.   Let me know if you have problems.   If you have any questions or concerns, please don't hesitate to send me a message via MyChart or call the office at [phone]. Thank you for visiting with [place] today! It's our pleasure caring for you.

## 2020-01-23 LAB — CBC WITH DIFFERENTIAL/PLATELET
Absolute Monocytes: 310 cells/uL (ref 200–950)
Basophils Absolute: 19 cells/uL (ref 0–200)
Basophils Relative: 0.6 %
Eosinophils Absolute: 61 cells/uL (ref 15–500)
Eosinophils Relative: 1.9 %
HCT: 38.8 % (ref 38.5–50.0)
Hemoglobin: 12.8 g/dL — ABNORMAL LOW (ref 13.2–17.1)
Lymphs Abs: 544 cells/uL — ABNORMAL LOW (ref 850–3900)
MCH: 32 pg (ref 27.0–33.0)
MCHC: 33 g/dL (ref 32.0–36.0)
MCV: 97 fL (ref 80.0–100.0)
MPV: 11 fL (ref 7.5–12.5)
Monocytes Relative: 9.7 %
Neutro Abs: 2266 cells/uL (ref 1500–7800)
Neutrophils Relative %: 70.8 %
Platelets: 179 10*3/uL (ref 140–400)
RBC: 4 10*6/uL — ABNORMAL LOW (ref 4.20–5.80)
RDW: 12.1 % (ref 11.0–15.0)
Total Lymphocyte: 17 %
WBC: 3.2 10*3/uL — ABNORMAL LOW (ref 3.8–10.8)

## 2020-01-23 LAB — IRON,TIBC AND FERRITIN PANEL
%SAT: 36 % (calc) (ref 20–48)
Ferritin: 166 ng/mL (ref 24–380)
Iron: 98 ug/dL (ref 50–180)
TIBC: 275 mcg/dL (calc) (ref 250–425)

## 2020-01-23 LAB — RETICULOCYTES
ABS Retic: 32000 cells/uL (ref 25000–9000)
Retic Ct Pct: 0.8 %

## 2020-01-23 LAB — B12 AND FOLATE PANEL
Folate: 21.4 ng/mL
Vitamin B-12: 1318 pg/mL — ABNORMAL HIGH (ref 200–1100)

## 2020-01-23 NOTE — Progress Notes (Signed)
Please call patient: I have reviewed his/her lab results. Labs are stable. Anemia is improving. WBC remains a little low. No other abnormalities are found. I will recheck blood counts in 6-12 weeks or so. Please schedule visit in that time frame if still feeling "tired". Thanks.

## 2020-02-17 ENCOUNTER — Other Ambulatory Visit: Payer: Self-pay | Admitting: Family Medicine

## 2020-02-19 NOTE — Telephone Encounter (Signed)
OK to refill? Please advise

## 2020-03-05 ENCOUNTER — Ambulatory Visit: Payer: Medicare HMO | Admitting: Neurology

## 2020-03-26 ENCOUNTER — Other Ambulatory Visit: Payer: Self-pay | Admitting: Neurology

## 2020-04-08 ENCOUNTER — Other Ambulatory Visit: Payer: Self-pay | Admitting: Family Medicine

## 2020-04-08 ENCOUNTER — Ambulatory Visit: Payer: Medicare HMO | Admitting: Neurology

## 2020-04-08 ENCOUNTER — Encounter: Payer: Self-pay | Admitting: Neurology

## 2020-04-08 ENCOUNTER — Other Ambulatory Visit: Payer: Self-pay

## 2020-04-08 ENCOUNTER — Other Ambulatory Visit: Payer: Self-pay | Admitting: Neurology

## 2020-04-08 VITALS — BP 121/66 | HR 61 | Ht 70.0 in | Wt 221.5 lb

## 2020-04-08 DIAGNOSIS — G479 Sleep disorder, unspecified: Secondary | ICD-10-CM | POA: Diagnosis not present

## 2020-04-08 DIAGNOSIS — G3184 Mild cognitive impairment, so stated: Secondary | ICD-10-CM | POA: Diagnosis not present

## 2020-04-08 DIAGNOSIS — G4752 REM sleep behavior disorder: Secondary | ICD-10-CM | POA: Insufficient documentation

## 2020-04-08 MED ORDER — DONEPEZIL HCL 10 MG PO TABS: 10.0000 mg | ORAL_TABLET | Freq: Every day | ORAL | 4 refills | Status: DC

## 2020-04-08 MED ORDER — MEMANTINE HCL 10 MG PO TABS: 10.0000 mg | ORAL_TABLET | Freq: Two times a day (BID) | ORAL | 4 refills | Status: DC

## 2020-04-08 MED ORDER — CLONAZEPAM 0.5 MG PO TABS: 0.2500 mg | ORAL_TABLET | Freq: Every evening | ORAL | 5 refills | Status: DC | PRN
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
## 2020-04-29 ENCOUNTER — Telehealth: Payer: Self-pay | Admitting: Neurology

## 2020-04-29 NOTE — Telephone Encounter (Signed)
Pt called, 4 days after last appt starting having tingling, shakiness, headaches. Had recently on Friday and Sunday took aspirin and made me fill normal. Ask Pt if he would like to schedule an appt. Pt replied I can just speak with the nurse. Also ask Pt if he need to go to the ER. Pt replied, no not that bad. Would like a call from the nurse.

## 2020-04-29 NOTE — Telephone Encounter (Signed)
I called the patient who reports three events of feeling tingly, shaky and headaches since he was seen here on 04/08/20. Symptoms resolved on their own the first two times. The third time he took aspirin 325mg , two tablets and felt it helped. He is aware that he should not take more aspirin than recommended on the bottle. Symptoms may would have resolved without medication intervention. He denies any weakness in limbs, difficulty speaking, trouble walking, dizziness, loss of balance, lack of coordination or confusion. He has history of anxiety and feels this may have represented a panic attack. States these episodes have occurred in the past but not for some time now. His PCP discontinued clonazepam because the patient felt it was contributing to nightmares. Dr. Krista Blue would like him to follow up with his PCP. She did offer to order MRI brain w/out if the patient would like to proceed with the scan. He would like to see his PCP first before deciding on moving forward with any additional testing.

## 2020-05-13 ENCOUNTER — Telehealth: Payer: Self-pay

## 2020-05-13 NOTE — Telephone Encounter (Signed)
Nurse Assessment Nurse: Windle Guard, RN, Lesa Date/Time Eilene Ghazi Time): 05/13/2020 11:48:29 AM Confirm and document reason for call. If symptomatic, describe symptoms. ---Caller states he stopped taking Fludrocort 0.1 mg on Saturday and now he is no longer dizzy. He feels better. His current blood pressure is 141/73. He was dizzy prior to stopping the medication. Does the patient have any new or worsening symptoms? ---Yes Will a triage be completed? ---Yes Related visit to physician within the last 2 weeks? ---No Does the PT have any chronic conditions? (i.e. diabetes, asthma, this includes High risk factors for pregnancy, etc.) ---Yes List chronic conditions. ---hypotension Is this a behavioral health or substance abuse call? ---No Guidelines Guideline Title Affirmed Question Affirmed Notes Nurse Date/Time (Eastern Time) Blood Pressure - High [1] Taking BP medications AND [2] feels is having side effects (e.g., impotence, cough, dizzy upon standing) Conner, RN, Lesa 05/13/2020 11:54:50 AM Disp. Time Eilene Ghazi Time) Disposition Final User 05/13/2020 11:57:40 AM SEE PCP WITHIN 3 DAYS Yes Conner, RN, Lesa PLEASE NOTE: All timestamps contained within this report are represented as [place] Standard Time. CONFIDENTIALTY NOTICE: This fax transmission is intended only for the addressee. It contains information that is legally privileged, confidential or otherwise protected from use or disclosure. If you are not the intended recipient, you are strictly prohibited from reviewing, disclosing, copying using or disseminating any of this information or taking any action in reliance on or regarding this information. If you have received this fax in error, please notify us immediately by telephone so that we can arrange for its return to [place]. Phone: [phone], Toll-Free: [phone], Fax: [phone] Page: 2 of 2 Call Id: [id number] [place] Disagree/Comply Comply Caller Understands Yes PreDisposition Did  not know what to do Care Advice Given Per Guideline SEE PCP WITHIN 3 DAYS: * You need to be seen within 2 or 3 days. CALL BACK IF: * Weakness or numbness of the face, arm or leg on one side of the body occurs * Difficulty walking, difficulty talking, or severe headache occurs * Chest pain or difficulty breathing occurs * You become worse CARE ADVICE given per High Blood Pressure (Adult) guideline. * Some people experience side effects when they take high blood pressure medications. * Possible side effects include cough, dizziness with standing, drowsiness and impotence. MEDICATION SIDE EFFECTS: Comments User: Starleen Blue, RN Date/Time Eilene Ghazi Time): 05/13/2020 11:57:36 AM Caller has an appt on 05/15/20

## 2020-05-13 NOTE — Telephone Encounter (Signed)
LVM to get pt triaged. There is an appointment on hold for him on Wednesday. Did not realize the pt needed to be triaged, therefore that is why an appt is on hold.

## 2020-05-13 NOTE — Telephone Encounter (Signed)
FYI, scheduled 12/8

## 2020-05-15 ENCOUNTER — Encounter: Payer: Self-pay | Admitting: Family Medicine

## 2020-05-15 ENCOUNTER — Ambulatory Visit (INDEPENDENT_AMBULATORY_CARE_PROVIDER_SITE_OTHER): Payer: Medicare HMO | Admitting: Family Medicine

## 2020-05-15 ENCOUNTER — Other Ambulatory Visit: Payer: Self-pay

## 2020-05-15 VITALS — BP 112/68 | HR 72 | Temp 97.9°F | Resp 16 | Ht 70.0 in | Wt 215.0 lb

## 2020-05-15 DIAGNOSIS — G3184 Mild cognitive impairment, so stated: Secondary | ICD-10-CM

## 2020-05-15 DIAGNOSIS — R42 Dizziness and giddiness: Secondary | ICD-10-CM | POA: Diagnosis not present

## 2020-05-15 DIAGNOSIS — R410 Disorientation, unspecified: Secondary | ICD-10-CM

## 2020-05-15 DIAGNOSIS — I950 Idiopathic hypotension: Secondary | ICD-10-CM

## 2020-05-15 NOTE — Progress Notes (Signed)
Subjective  CC:  Chief Complaint  Patient presents with  . Dizziness    HPI: Caleb Gibson is a 79 y.o. male who presents to the office today to address the problems listed above in the chief complaint.  79 year old with longstanding history of idiopathic orthostatic hypotension was asymptomatic.  We started Florinef back in September.  The last several weeks he has had frontal headache, lightheadedness, blood pressures have been running elevated at 150-170 over 80s to 90s which is very unusual, and his wife reports that he seemed confused for a period of 2 weeks.  He stopped the Florinef and now feels back to normal.  Of note, he was seen by neurology November 1 and had an excellent visit with improved cognitive testing.  He remains on Namenda and Aricept without side effects.  No chest pain, shortness of breath, diplopia, dysarthria or paresis.  Appetite is fine.   Assessment  1. Lightheadedness   2. Confusion   3. Mild cognitive impairment   4. Idiopathic hypotension      Plan   Lightheadedness and confusion: Possibly related to Florinef.  Agree with stopping medication and monitoring.  Monitor for lightheadedness orthostatic hypotension.  Fall prevention discussed.  Check lab work.  No current neurologic deficits.  No need for brain imaging at this time.  Follow-up if worsens.  Follow up: Return if symptoms worsen or fail to improve.  05/22/2020  Orders Placed This Encounter  Procedures  . TSH  . CBC with Differential/Platelet  . COMPLETE METABOLIC PANEL WITH GFR   No orders of the defined types were placed in this encounter.     I reviewed the patients updated PMH, FH, and SocHx.    Patient Active Problem List   Diagnosis Date Noted  . Mild cognitive impairment 05/10/2018    Priority: High  . Prostate cancer ([place]) 11/22/2017    Priority: High  . Tubular adenoma of colon 06/15/2017    Priority: High  . On statin therapy due to risk of future cardiovascular event  10/08/2015    Priority: High  . Mixed hyperlipidemia 09/27/2014    Priority: High  . Idiopathic hypotension 01/07/2016    Priority: Medium  . BPH (benign prostatic hyperplasia) 10/01/2011    Priority: Medium  . Erectile dysfunction 10/01/2011    Priority: Medium  . Osteoarthritis, knee 08/01/2010    Priority: Medium  . Claustrophobia 07/08/2009    Priority: Medium  . Basal cell carcinoma of skin 06/15/2017    Priority: Low  . Simple cyst of kidney 11/12/2011    Priority: Low  . Allergic rhinitis 08/27/2011    Priority: Low  . Sleep disturbance 04/08/2020  . History of traumatic head injury 08/04/2018  . Memory loss 08/04/2018   Current Meds  Medication Sig  . cholecalciferol (VITAMIN D) 1000 units tablet Take 1,000 Units by mouth daily.   Marland Kitchen donepezil (ARICEPT) 10 MG tablet Take 1 tablet (10 mg total) by mouth daily.    Allergies: Patient is allergic to viagra [sildenafil citrate]. Family History: Patient family history includes Alcohol abuse in his maternal aunt and mother; Cirrhosis in his maternal aunt; Diabetes in his father; Glaucoma in his father; Heart disease in his maternal grandmother; Lymphoma in his mother; Melanoma in his mother; Prostate cancer in his father. Social History:  Patient  reports that he quit smoking about 25 years ago. His smoking use included cigarettes. He has never used smokeless tobacco. He reports current alcohol use. He reports that he does  not use drugs.  Review of Systems: Constitutional: Negative for fever malaise or anorexia Cardiovascular: negative for chest pain Respiratory: negative for SOB or persistent cough Gastrointestinal: negative for abdominal pain  Objective  Vitals: BP 112/68   Pulse 72   Temp 97.9 F (36.6 C)   Resp 16   Ht 5\' 10"  (1.778 m)   Wt 215 lb (97.5 kg)   SpO2 97%   BMI 30.85 kg/m  General: no acute distress , A&Ox3, normal cognition HEENT: PEERL, conjunctiva normal, neck is supple Cardiovascular:  RRR  without murmur or gallop.  Respiratory:  Good breath sounds bilaterally, CTAB with normal respiratory effort Skin:  Warm, no rashes     Commons side effects, risks, benefits, and alternatives for medications and treatment plan prescribed today were discussed, and the patient expressed understanding of the given instructions. Patient is instructed to call or message via MyChart if he/she has any questions or concerns regarding our treatment plan. No barriers to understanding were identified. We discussed Red Flag symptoms and signs in detail. Patient expressed understanding regarding what to do in case of urgent or emergency type symptoms.   Medication list was reconciled, printed and provided to the patient in AVS. Patient instructions and summary information was reviewed with the patient as documented in the AVS. This note was prepared with assistance of Dragon voice recognition software. Occasional wrong-word or sound-a-like substitutions may have occurred due to the inherent limitations of voice recognition software  This visit occurred during the SARS-CoV-2 public health emergency.  Safety protocols were in place, including screening questions prior to the visit, additional usage of staff PPE, and extensive cleaning of exam room while observing appropriate contact time as indicated for disinfecting solutions.

## 2020-05-15 NOTE — Patient Instructions (Signed)
Please follow up if symptoms do not improve or as needed.   I will release your lab results to you on your MyChart account with further instructions. Please reply with any questions.

## 2020-05-16 LAB — CBC WITH DIFFERENTIAL/PLATELET
Absolute Monocytes: 356 cells/uL (ref 200–950)
Basophils Absolute: 18 cells/uL (ref 0–200)
Basophils Relative: 0.4 %
Eosinophils Absolute: 41 cells/uL (ref 15–500)
Eosinophils Relative: 0.9 %
HCT: 37.2 % — ABNORMAL LOW (ref 38.5–50.0)
Hemoglobin: 12.8 g/dL — ABNORMAL LOW (ref 13.2–17.1)
Lymphs Abs: 635 cells/uL — ABNORMAL LOW (ref 850–3900)
MCH: 32.7 pg (ref 27.0–33.0)
MCHC: 34.4 g/dL (ref 32.0–36.0)
MCV: 94.9 fL (ref 80.0–100.0)
MPV: 10.6 fL (ref 7.5–12.5)
Monocytes Relative: 7.9 %
Neutro Abs: 3452 cells/uL (ref 1500–7800)
Neutrophils Relative %: 76.7 %
Platelets: 196 10*3/uL (ref 140–400)
RBC: 3.92 10*6/uL — ABNORMAL LOW (ref 4.20–5.80)
RDW: 12.4 % (ref 11.0–15.0)
Total Lymphocyte: 14.1 %
WBC: 4.5 10*3/uL (ref 3.8–10.8)

## 2020-05-16 LAB — COMPLETE METABOLIC PANEL WITH GFR
AG Ratio: 2.6 (calc) — ABNORMAL HIGH (ref 1.0–2.5)
ALT: 11 U/L (ref 9–46)
AST: 10 U/L (ref 10–35)
Albumin: 4.4 g/dL (ref 3.6–5.1)
Alkaline phosphatase (APISO): 68 U/L (ref 35–144)
BUN: 19 mg/dL (ref 7–25)
CO2: 29 mmol/L (ref 20–32)
Calcium: 9.6 mg/dL (ref 8.6–10.3)
Chloride: 107 mmol/L (ref 98–110)
Creat: 0.95 mg/dL (ref 0.70–1.11)
GFR, Est African American: 87 mL/min/{1.73_m2} (ref >=60)
GFR, Est Non African American: 75 mL/min/{1.73_m2} (ref >=60)
Globulin: 1.7 g/dL (calc) — ABNORMAL LOW (ref 1.9–3.7)
Glucose, Bld: 105 mg/dL — ABNORMAL HIGH (ref 65–99)
Potassium: 4.1 mmol/L (ref 3.5–5.3)
Sodium: 141 mmol/L (ref 135–146)
Total Bilirubin: 0.4 mg/dL (ref 0.2–1.2)
Total Protein: 6.1 g/dL (ref 6.1–8.1)

## 2020-05-16 LAB — TSH: TSH: 1.1 mIU/L (ref 0.40–4.50)

## 2020-05-20 NOTE — Progress Notes (Signed)
Please call patient: I have reviewed his/her lab results. Labs are all stable.

## 2020-05-22 ENCOUNTER — Ambulatory Visit: Payer: Medicare HMO | Admitting: Family Medicine

## 2020-06-03 DIAGNOSIS — Z85828 Personal history of other malignant neoplasm of skin: Secondary | ICD-10-CM | POA: Diagnosis not present

## 2020-06-03 DIAGNOSIS — L821 Other seborrheic keratosis: Secondary | ICD-10-CM | POA: Diagnosis not present

## 2020-06-03 DIAGNOSIS — L570 Actinic keratosis: Secondary | ICD-10-CM | POA: Diagnosis not present

## 2020-06-03 DIAGNOSIS — D485 Neoplasm of uncertain behavior of skin: Secondary | ICD-10-CM | POA: Diagnosis not present

## 2020-06-03 DIAGNOSIS — D225 Melanocytic nevi of trunk: Secondary | ICD-10-CM | POA: Diagnosis not present

## 2020-06-03 DIAGNOSIS — C44519 Basal cell carcinoma of skin of other part of trunk: Secondary | ICD-10-CM | POA: Diagnosis not present

## 2020-06-03 DIAGNOSIS — C44612 Basal cell carcinoma of skin of right upper limb, including shoulder: Secondary | ICD-10-CM | POA: Diagnosis not present

## 2020-06-25 ENCOUNTER — Encounter: Payer: Medicare HMO | Admitting: Family Medicine

## 2020-07-09 DIAGNOSIS — C61 Malignant neoplasm of prostate: Secondary | ICD-10-CM | POA: Diagnosis not present

## 2020-07-10 LAB — PSA: PSA: 1.77

## 2020-07-16 ENCOUNTER — Ambulatory Visit (INDEPENDENT_AMBULATORY_CARE_PROVIDER_SITE_OTHER): Payer: Medicare HMO | Admitting: Family Medicine

## 2020-07-16 ENCOUNTER — Encounter: Payer: Self-pay | Admitting: Family Medicine

## 2020-07-16 ENCOUNTER — Other Ambulatory Visit: Payer: Self-pay

## 2020-07-16 VITALS — BP 118/66 | HR 77 | Temp 97.2°F | Ht 70.0 in | Wt 216.2 lb

## 2020-07-16 DIAGNOSIS — Z0000 Encounter for general adult medical examination without abnormal findings: Secondary | ICD-10-CM

## 2020-07-16 DIAGNOSIS — I950 Idiopathic hypotension: Secondary | ICD-10-CM | POA: Diagnosis not present

## 2020-07-16 DIAGNOSIS — D126 Benign neoplasm of colon, unspecified: Secondary | ICD-10-CM | POA: Diagnosis not present

## 2020-07-16 DIAGNOSIS — D649 Anemia, unspecified: Secondary | ICD-10-CM | POA: Diagnosis not present

## 2020-07-16 DIAGNOSIS — G3184 Mild cognitive impairment, so stated: Secondary | ICD-10-CM

## 2020-07-16 DIAGNOSIS — Z79899 Other long term (current) drug therapy: Secondary | ICD-10-CM | POA: Diagnosis not present

## 2020-07-16 DIAGNOSIS — C61 Malignant neoplasm of prostate: Secondary | ICD-10-CM

## 2020-07-16 LAB — LIPID PANEL
Cholesterol: 132 mg/dL (ref 0–200)
HDL: 67.6 mg/dL (ref >39.00)
LDL Cholesterol: 56 mg/dL (ref 0–99)
NonHDL: 64.68
Total CHOL/HDL Ratio: 2
Triglycerides: 43 mg/dL (ref 0.0–149.0)
VLDL: 8.6 mg/dL (ref 0.0–40.0)

## 2020-07-16 LAB — COMPREHENSIVE METABOLIC PANEL
ALT: 14 U/L (ref 0–53)
AST: 14 U/L (ref 0–37)
Albumin: 4.3 g/dL (ref 3.5–5.2)
Alkaline Phosphatase: 63 U/L (ref 39–117)
BUN: 14 mg/dL (ref 6–23)
CO2: 30 mEq/L (ref 19–32)
Calcium: 9.4 mg/dL (ref 8.4–10.5)
Chloride: 106 mEq/L (ref 96–112)
Creatinine, Ser: 1.01 mg/dL (ref 0.40–1.50)
GFR: 70.36 mL/min (ref >60.00)
Glucose, Bld: 104 mg/dL — ABNORMAL HIGH (ref 70–99)
Potassium: 4.3 mEq/L (ref 3.5–5.1)
Sodium: 141 mEq/L (ref 135–145)
Total Bilirubin: 0.5 mg/dL (ref 0.2–1.2)
Total Protein: 6.4 g/dL (ref 6.0–8.3)

## 2020-07-16 LAB — CBC WITH DIFFERENTIAL/PLATELET
Basophils Absolute: 0 10*3/uL (ref 0.0–0.1)
Basophils Relative: 0.7 % (ref 0.0–3.0)
Eosinophils Absolute: 0.1 10*3/uL (ref 0.0–0.7)
Eosinophils Relative: 2.6 % (ref 0.0–5.0)
HCT: 37.5 % — ABNORMAL LOW (ref 39.0–52.0)
Hemoglobin: 12.9 g/dL — ABNORMAL LOW (ref 13.0–17.0)
Lymphocytes Relative: 18 % (ref 12.0–46.0)
Lymphs Abs: 0.7 10*3/uL (ref 0.7–4.0)
MCHC: 34.5 g/dL (ref 30.0–36.0)
MCV: 93.2 fl (ref 78.0–100.0)
Monocytes Absolute: 0.4 10*3/uL (ref 0.1–1.0)
Monocytes Relative: 10.3 % (ref 3.0–12.0)
Neutro Abs: 2.5 10*3/uL (ref 1.4–7.7)
Neutrophils Relative %: 68.4 % (ref 43.0–77.0)
Platelets: 183 10*3/uL (ref 150.0–400.0)
RBC: 4.03 Mil/uL — ABNORMAL LOW (ref 4.22–5.81)
RDW: 13.3 % (ref 11.5–15.5)
WBC: 3.7 10*3/uL — ABNORMAL LOW (ref 4.0–10.5)

## 2020-07-16 NOTE — Patient Instructions (Signed)
Please return in 6 months for recheck.   We will notify you with your lab results.   If you have any questions or concerns, please don't hesitate to send me a message via MyChart or call the office at [phone]. Thank you for visiting with [place] today! It's our pleasure caring for you.

## 2020-07-16 NOTE — Progress Notes (Signed)
Subjective  Chief Complaint  Patient presents with  . Hypotension    HPI: Jeremyah Gibson is a 80 y.o. male who presents to [facility] [place] today for a Male Wellness Visit. He also has the concerns and/or needs as listed above in the chief complaint. These will be addressed in addition to the Health Maintenance Visit.   Wellness Visit: annual visit with health maintenance review and exam    Health maintenance: Screens are up-to-date.  Declines Covid vaccinations.  Feels strongly about this.  Other immunizations are up-to-date.  Feels well.  No new concerns.  Body mass index is 31.02 kg/m. Wt Readings from Last 3 Encounters:  07/16/20 216 lb 3.2 oz (98.1 kg)  05/15/20 215 lb (97.5 kg)  04/08/20 221 lb 8 oz (100.5 kg)     Chronic disease management visit and/or acute problem visit:  Mild cognitive impairment: This still bothers him.  He is on Namenda and Aricept.  I reviewed notes from Dr. Krista Blue.  His most recent testing score was improved.  He does tend to worry about this.  Possible underlying anxiety.  He did wean off of Celexa back in October.  He feels that he is stable.  Denies symptoms of depression or chronic anxiety.  No panic attacks.  Anemia, normocytic: New onset last fall.  Initial work-up has been negative.  He denies melena, GERD, fatigue, sweats.  Energy level is good.  Idiopathic hypertension: Blood pressures have normalized.  Hyperlipidemia on statin: Tolerating low-dose.  No adverse effects.  History of tubular adenoma due for repeat colonoscopy in a few years.  No weight loss or abdominal pain.  History of prostate cancer and BPH: Has follow-up with [facility].  Stable on Flomax.  Most recent PSA was stable.  Patient Active Problem List   Diagnosis Date Noted  . Mild cognitive impairment 05/10/2018  . Prostate cancer ([place]) 11/22/2017  . Tubular adenoma of colon 06/15/2017  . On statin therapy due to risk of future cardiovascular  event 10/08/2015  . Mixed hyperlipidemia 09/27/2014  . Idiopathic hypotension 01/07/2016  . BPH (benign prostatic hyperplasia) 10/01/2011  . Erectile dysfunction 10/01/2011  . Osteoarthritis, knee 08/01/2010  . Claustrophobia 07/08/2009  . Basal cell carcinoma of skin 06/15/2017  . Simple cyst of kidney 11/12/2011  . Allergic rhinitis 08/27/2011  . Sleep disturbance 04/08/2020  . History of traumatic head injury 08/04/2018  . Memory loss 08/04/2018   Health Maintenance  Topic Date Due  . COLONOSCOPY (Pts 45-79yrs Insurance coverage will need to be confirmed)  09/01/2021  . TETANUS/TDAP  09/30/2021  . INFLUENZA VACCINE  Completed  . PNA vac Low Risk Adult  Completed  . COVID-19 Vaccine  Discontinued   Immunization History  Administered Date(s) Administered  . Influenza Split 03/14/2011  . Influenza, High Dose Seasonal PF 02/27/2012, 02/05/2014, 03/10/2015, 04/14/2016, 03/02/2017, 02/23/2018, 01/26/2019, 01/26/2019  . Influenza, Seasonal, Injecte, Preservative Fre 02/27/2013  . Influenza-Unspecified 04/15/2017, 03/08/2020  . Pneumococcal Conjugate-13 02/05/2014  . Pneumococcal-Unspecified 07/08/2009  . Td 11/15/2007  . Tdap 10/01/2011  . Zoster 06/20/2010  . Zoster Recombinat (Shingrix) 02/05/2018, 04/18/2018   We updated and reviewed the patient's past history in detail and it is documented below. Allergies: Patient is allergic to viagra [sildenafil citrate]. Past Medical History  has a past medical history of Anxiety, Basal cell carcinoma of skin, Claustrophobia, High cholesterol, Memory loss, Night terrors, Prostate cancer ([place]), Sleep walking, and Tubular adenoma of colon (06/15/2017). Past Surgical History Patient  has  a past surgical history that includes Appendectomy; Dental surgery; Rhinoplasty; Tonsillectomy and adenoidectomy; Mohs surgery; and Knee arthroscopy with meniscal repair ([place]). Social History Patient  reports that he quit smoking about 26 years ago. His  smoking use included cigarettes. He has never used smokeless tobacco. He reports current alcohol use. He reports that he does not use drugs. Family History family history includes Alcohol abuse in his maternal aunt and mother; Cirrhosis in his maternal aunt; Diabetes in his father; Glaucoma in his father; Heart disease in his maternal grandmother; Lymphoma in his mother; Melanoma in his mother; Prostate cancer in his father. Review of Systems: Constitutional: negative for fever or malaise Ophthalmic: negative for photophobia, double vision or loss of vision Cardiovascular: negative for chest pain, dyspnea on exertion, or new LE swelling Respiratory: negative for SOB or persistent cough Gastrointestinal: negative for abdominal pain, change in bowel habits or melena Genitourinary: negative for dysuria or gross hematuria Musculoskeletal: negative for new gait disturbance or muscular weakness Integumentary: negative for new or persistent rashes Neurological: negative for TIA or stroke symptoms Psychiatric: negative for SI or delusions Allergic/Immunologic: negative for hives  Patient Care Team    Relationship Specialty Notifications Start End  [name], MD PCP - General Family Medicine  06/15/17   Ceasar Mons, MD Consulting Physician Urology  01/13/18   Manson Passey, [facility]  Specialist  01/13/18   Danella Sensing, MD Consulting Physician Dermatology  01/13/18   Ronnald Ramp  Dentistry  01/13/18   Marcial Pacas, MD Consulting Physician Neurology  11/30/18   Susette Racer, MD Consulting Physician Endocrinology  11/20/19    Objective  Vitals: Temp (!) 97.2 F (36.2 C) (Temporal)   Ht 5\' 10"  (1.778 m)   Wt 216 lb 3.2 oz (98.1 kg)   BMI 31.02 kg/m  General:  Well developed, well nourished, no acute distress  Psych:  Alert and orientedx3,normal mood and affect HEENT:  Normocephalic, atraumatic, non-icteric sclera, PERRL, supple neck without adenopathy, mass or thyromegaly Cardiovascular:  Normal S1,  S2, RRR without gallop, rub or murmur,+2 distal pulses in bilateral upper and lower extremities. Respiratory:  Good breath sounds bilaterally, CTAB with normal respiratory effort Gastrointestinal: normal bowel sounds, soft, non-tender, no noted masses. No HSM MSK: no deformities, contusions. Joints are without erythema or swelling. Spine and CVA region are nontender Skin:  Warm, no rashes or suspicious lesions noted Neurologic:    Mental status is normal. CN 2-11 are normal. Gross motor and sensory exams are normal. Stable gait. No tremor GU: No inguinal hernias or adenopathy are appreciated bilaterally   Assessment  1. Annual physical exam   2. Idiopathic hypotension   3. On statin therapy due to risk of future cardiovascular event   4. Prostate cancer ([place])   5. Mild cognitive impairment   6. Normocytic anemia   7. Tubular adenoma of colon      Plan  Male Wellness Visit:  Age appropriate Health Maintenance and Prevention measures were discussed with patient. Included topics are cancer screening recommendations, ways to keep healthy (see AVS) including dietary and exercise recommendations, regular eye and dental care, use of seat belts, and avoidance of moderate alcohol use and tobacco use.   BMI: discussed patient's BMI and encouraged positive lifestyle modifications to help get to or maintain a target BMI.  HM needs and immunizations were addressed and ordered. See below for orders. See HM and immunization section for updates.  Routine labs and screening tests ordered including cmp, cbc and lipids where  appropriate.  Discussed recommendations regarding Vit D and calcium supplementation (see AVS)  Chronic disease f/u and/or acute problem visit: (deemed necessary to be done in addition to the wellness visit):  Mild temperature impairment with possible underlying anxiety: Both stable currently.  Counseling education given.  Follow-up 6 months.  Will remain off  Celexa.  Hyperlipidemia: Due for fasting check on statin.  Idiopathic hypotension: Continue good hydration, high salt diet and fall prevention.  Anemia: Recheck iron studies and CBC today.  Energy levels are good.  Tubular adenoma: No melena.  No abdominal pain.  q 5-year surveillance schedule.   Follow up: Return in about 6 months (around 01/13/2021) for recheck.   Commons side effects, risks, benefits, and alternatives for medications and treatment plan prescribed today were discussed, and the patient expressed understanding of the given instructions. Patient is instructed to call or message via MyChart if he/she has any questions or concerns regarding our treatment plan. No barriers to understanding were identified. We discussed Red Flag symptoms and signs in detail. Patient expressed understanding regarding what to do in case of urgent or emergency type symptoms.   Medication list was reconciled, printed and provided to the patient in AVS. Patient instructions and summary information was reviewed with the patient as documented in the AVS. This note was prepared with assistance of Dragon voice recognition software. Occasional wrong-word or sound-a-like substitutions may have occurred due to the inherent limitations of voice recognition software  This visit occurred during the SARS-CoV-2 public health emergency.  Safety protocols were in place, including screening questions prior to the visit, additional usage of staff PPE, and extensive cleaning of exam room while observing appropriate contact time as indicated for disinfecting solutions.   Orders Placed This Encounter  Procedures  . CBC with Differential/Platelet  . Iron, TIBC and Ferritin Panel  . Lipid panel  . Comprehensive metabolic panel   No orders of the defined types were placed in this encounter.

## 2020-07-17 LAB — IRON,TIBC AND FERRITIN PANEL
%SAT: 42 % (calc) (ref 20–48)
Ferritin: 143 ng/mL (ref 24–380)
Iron: 112 ug/dL (ref 50–180)
TIBC: 265 mcg/dL (calc) (ref 250–425)

## 2020-07-17 NOTE — Progress Notes (Signed)
Please call patient: I have reviewed his/her lab results. Please let him know all of his labs are stable. No worrisome findings are present. No changes are needed at this time.

## 2020-07-24 DIAGNOSIS — M19011 Primary osteoarthritis, right shoulder: Secondary | ICD-10-CM | POA: Diagnosis not present

## 2020-07-24 DIAGNOSIS — M19012 Primary osteoarthritis, left shoulder: Secondary | ICD-10-CM | POA: Diagnosis not present

## 2020-09-09 ENCOUNTER — Telehealth: Payer: Self-pay | Admitting: Neurology

## 2020-09-09 NOTE — Telephone Encounter (Signed)
I called the patient back. He is requesting an early follow up due to concerns over his declining memory. He has noticed more issues remembering familiar names, sometimes gets into his car and has to really think about where he is heading. He was placed in an open slot with Sarah.

## 2020-09-09 NOTE — Telephone Encounter (Signed)
Pt feels his memory is slipping more and more than it was this time last year.  Pt would like a call to discuss.

## 2020-09-10 ENCOUNTER — Ambulatory Visit: Payer: Medicare HMO | Admitting: Neurology

## 2020-09-10 ENCOUNTER — Encounter: Payer: Self-pay | Admitting: Neurology

## 2020-09-10 VITALS — BP 108/66 | HR 64 | Ht 70.0 in | Wt 220.0 lb

## 2020-09-10 DIAGNOSIS — G3184 Mild cognitive impairment, so stated: Secondary | ICD-10-CM

## 2020-09-10 DIAGNOSIS — G479 Sleep disorder, unspecified: Secondary | ICD-10-CM | POA: Diagnosis not present
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
## 2020-09-10 NOTE — Patient Instructions (Addendum)
Continue current medications  Referral to neuropsychological  Keep follow-up in November

## 2020-09-11 ENCOUNTER — Ambulatory Visit (INDEPENDENT_AMBULATORY_CARE_PROVIDER_SITE_OTHER): Payer: Medicare HMO | Admitting: Family Medicine

## 2020-09-11 ENCOUNTER — Other Ambulatory Visit: Payer: Self-pay

## 2020-09-11 ENCOUNTER — Encounter: Payer: Self-pay | Admitting: Family Medicine

## 2020-09-11 ENCOUNTER — Ambulatory Visit (INDEPENDENT_AMBULATORY_CARE_PROVIDER_SITE_OTHER): Payer: Medicare HMO

## 2020-09-11 VITALS — BP 132/78 | HR 75 | Temp 99.0°F | Wt 219.0 lb

## 2020-09-11 DIAGNOSIS — G3184 Mild cognitive impairment, so stated: Secondary | ICD-10-CM

## 2020-09-11 DIAGNOSIS — R079 Chest pain, unspecified: Secondary | ICD-10-CM

## 2020-09-11 DIAGNOSIS — R0789 Other chest pain: Secondary | ICD-10-CM | POA: Diagnosis not present
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
## 2020-09-11 NOTE — Patient Instructions (Signed)
Please return in January 2023 for your physical.  Monitor for rash. Use advil as needed.   If you have any questions or concerns, please don't hesitate to send me a message via MyChart or call the office at [phone]. Thank you for visiting with [place] today! It's our pleasure caring for you.

## 2020-09-13 NOTE — Progress Notes (Signed)
Please call patient: I have reviewed his/her lab results. His chest xray looks fine. No problems or bony abnormalities noted

## 2020-09-19 ENCOUNTER — Encounter: Payer: Self-pay | Admitting: Psychology

## 2020-10-10 ENCOUNTER — Telehealth: Payer: Self-pay

## 2020-10-10 NOTE — Telephone Encounter (Signed)
Patient called in stating his claustrophobia has been getting out of hand recently, having more frequent. Wondering if he should go back on the medication that he was previously on or come in for an office visit.

## 2020-10-10 NOTE — Telephone Encounter (Signed)
Patient is scheduled   

## 2020-10-10 NOTE — Telephone Encounter (Signed)
Please call pt and schedule a visit.

## 2020-10-15 ENCOUNTER — Other Ambulatory Visit: Payer: Self-pay

## 2020-10-15 ENCOUNTER — Ambulatory Visit (INDEPENDENT_AMBULATORY_CARE_PROVIDER_SITE_OTHER): Payer: Medicare HMO | Admitting: Family Medicine

## 2020-10-15 ENCOUNTER — Encounter: Payer: Self-pay | Admitting: Family Medicine

## 2020-10-15 VITALS — BP 122/74 | HR 68 | Temp 97.7°F | Ht 70.0 in | Wt 217.4 lb

## 2020-10-15 DIAGNOSIS — F40240 Claustrophobia: Secondary | ICD-10-CM

## 2020-10-15 DIAGNOSIS — G3184 Mild cognitive impairment, so stated: Secondary | ICD-10-CM | POA: Diagnosis not present

## 2020-10-15 DIAGNOSIS — F410 Panic disorder [episodic paroxysmal anxiety] without agoraphobia: Secondary | ICD-10-CM

## 2020-10-15 DIAGNOSIS — F411 Generalized anxiety disorder: Secondary | ICD-10-CM | POA: Diagnosis not present

## 2020-10-15 MED ORDER — ALPRAZOLAM 0.5 MG PO TABS: 0.5000 mg | ORAL_TABLET | Freq: Every day | ORAL | 0 refills | Status: AC | PRN

## 2020-10-15 MED ORDER — CITALOPRAM HYDROBROMIDE 10 MG PO TABS: 10.0000 mg | ORAL_TABLET | Freq: Every day | ORAL | 3 refills | Status: DC

## 2020-10-15 NOTE — Progress Notes (Signed)
Subjective  CC:  Chief Complaint  Patient presents with  . claustrophobia    States has gotten worse since last discussed.    HPI: Caleb Gibson is a 80 y.o. male who presents to the office today to address the problems listed above in the chief complaint, mood problems.  Pt is here with his wife: describing "feeling off". Both report that recently, his "claustrophobic" sxs are worsening. He describes sxs of panic: nausea, upset stomach, racing heart, feeling of impending doom, need to leave the room or escape from the group of friends he is with, etc ... typically related to airplanes, long car rides etc but now happening with watching stressful TV shows, being with large groups of people etc. As well, he describes chronic worry and depressive sxs including feeling down and more tired than usual.   He had been on celexa for decades; he thought this was for claustrophobia; we weaned it off in October 2021. Since, he has noted more memory concerns, more worry and now depressive sxs and panic. He is doing less things to avoid these feelings.  His mother had anxiety problems as well.  .   Assessment  1. GAD (generalized anxiety disorder)   2. Panic attacks   3. Claustrophobia   4. Mild cognitive impairment      Plan   Panic/GAD:  Worsening and progressive sxs since stopping celexa. Suspect had chronic GAD or ptsd in addition to claustrophobia. Counseling and education done. Restart celexa at 10 daily and xanax prn for panic.   Reviewed concept of mood problems caused by biochemical imbalance of neurotransmitters and rationale for treatment with medications and therapy.   Counseling given: pt was instructed to contact office, on-call physician or crisis Hotline if symptoms worsen significantly. If patient develops any suicidal or homicidal thoughts, he is directed to the ER immediately.   Follow up: 4-6 weeks to recheck mood  No orders of the defined types were placed in this  encounter.  Meds ordered this encounter  Medications  . ALPRAZolam (XANAX) 0.5 MG tablet    Sig: Take 1 tablet (0.5 mg total) by mouth daily as needed for anxiety.    Dispense:  30 tablet    Refill:  0  . citalopram (CELEXA) 10 MG tablet    Sig: Take 1 tablet (10 mg total) by mouth daily.    Dispense:  30 tablet    Refill:  3      I reviewed the patients updated PMH, FH, and SocHx.    Patient Active Problem List   Diagnosis Date Noted  . Mild cognitive impairment 05/10/2018    Priority: High  . Prostate cancer ([place]) 11/22/2017    Priority: High  . Tubular adenoma of colon 06/15/2017    Priority: High  . On statin therapy due to risk of future cardiovascular event 10/08/2015    Priority: High  . Mixed hyperlipidemia 09/27/2014    Priority: High  . History of traumatic head injury 08/04/2018    Priority: Medium  . Idiopathic hypotension 01/07/2016    Priority: Medium  . BPH (benign prostatic hyperplasia) 10/01/2011    Priority: Medium  . Erectile dysfunction 10/01/2011    Priority: Medium  . Osteoarthritis, knee 08/01/2010    Priority: Medium  . Claustrophobia 07/08/2009    Priority: Medium  . Basal cell carcinoma of skin 06/15/2017    Priority: Low  . Simple cyst of kidney 11/12/2011    Priority: Low  . Allergic rhinitis 08/27/2011  Priority: Low  . Sleep disturbance 04/08/2020  . Memory loss 08/04/2018   Current Meds  Medication Sig  . ALPRAZolam (XANAX) 0.5 MG tablet Take 1 tablet (0.5 mg total) by mouth daily as needed for anxiety.  . Amino Acids (L-CARNITINE PO) Take 250 mg by mouth daily.   Marland Kitchen CALCIUM CITRATE PO Take 600 mg by mouth daily.   . cholecalciferol (VITAMIN D) 1000 units tablet Take 1,000 Units by mouth daily.   . citalopram (CELEXA) 10 MG tablet Take 1 tablet (10 mg total) by mouth daily.  Marland Kitchen donepezil (ARICEPT) 10 MG tablet Take 1 tablet (10 mg total) by mouth daily.  Marland Kitchen glucosamine-chondroitin 500-400 MG tablet Take 1 tablet by mouth daily.    . Lycopene 10 MG CAPS Take 1 capsule by mouth daily.   . memantine (NAMENDA) 10 MG tablet Take 1 tablet (10 mg total) by mouth 2 (two) times daily.  . Multiple Vitamins-Minerals (CENTRUM SILVER) tablet Take 1 tablet by mouth daily.   . Multiple Vitamins-Minerals (EYE VITAMINS PO)   . rosuvastatin (CRESTOR) 5 MG tablet TAKE 1/2 TABLET EVERY DAY  . tamsulosin (FLOMAX) 0.4 MG CAPS capsule Take 0.4 mg by mouth.  . vitamin C (ASCORBIC ACID) 500 MG tablet Take 500 mg by mouth daily.     Allergies: Patient is allergic to viagra [sildenafil citrate]. Family history:  Patient family history includes Alcohol abuse in his maternal aunt and mother; Cirrhosis in his maternal aunt; Diabetes in his father; Glaucoma in his father; Heart disease in his maternal grandmother; Lymphoma in his mother; Melanoma in his mother; Prostate cancer in his father. Social History   Socioeconomic History  . Marital status: Married    Spouse name: Not on file  . Number of children: 3  . Years of education: 2 years college  . Highest education level: Not on file  Occupational History    Comment: retired  Tobacco Use  . Smoking status: Former Smoker    Types: Cigarettes    Quit date: 1996    Years since quitting: 26.3  . Smokeless tobacco: Never Used  Vaping Use  . Vaping Use: Never used  Substance and Sexual Activity  . Alcohol use: Yes    Comment: 2-6 glasses of wine per week  . Drug use: No  . Sexual activity: Yes  Other Topics Concern  . Not on file  Social History Narrative   Lives at home with his wife.   One 12oz can of Diet Coke per day.   Right-handed.   Social Determinants of Health   Financial Resource Strain: Not on file  Food Insecurity: Not on file  Transportation Needs: Not on file  Physical Activity: Not on file  Stress: Not on file  Social Connections: Not on file     Review of Systems: Constitutional: Negative for fever malaise or anorexia Cardiovascular: negative for chest  pain Respiratory: negative for SOB or persistent cough Gastrointestinal: negative for abdominal pain  Objective  Vitals: BP 122/74   Pulse 68   Temp 97.7 F (36.5 C) (Temporal)   Ht 5\' 10"  (1.778 m)   Wt 217 lb 6.4 oz (98.6 kg)   SpO2 98%   BMI 31.19 kg/m  General: no acute distress, well appearing, no apparent distress, well groomed Psych:  Alert and oriented x 3,. Slightly anxious. Nl speech. Fair insight    Commons side effects, risks, benefits, and alternatives for medications and treatment plan prescribed today were discussed, and the patient expressed understanding  of the given instructions. Patient is instructed to call or message via MyChart if he/she has any questions or concerns regarding our treatment plan. No barriers to understanding were identified. We discussed Red Flag symptoms and signs in detail. Patient expressed understanding regarding what to do in case of urgent or emergency type symptoms.   Medication list was reconciled, printed and provided to the patient in AVS. Patient instructions and summary information was reviewed with the patient as documented in the AVS. This note was prepared with assistance of Dragon voice recognition software. Occasional wrong-word or sound-a-like substitutions may have occurred due to the inherent limitations of voice recognition software

## 2020-10-15 NOTE — Patient Instructions (Signed)
Please return in 4-6 weeks for mood recheck.   If you have any questions or concerns, please don't hesitate to send me a message via MyChart or call the office at [phone]. Thank you for visiting with [place] today! It's our pleasure caring for you.   Panic Attack A panic attack is a sudden episode of severe anxiety, fear, or discomfort that causes physical and emotional symptoms. The attack may be in response to something frightening, or it may occur for no known reason. Symptoms of a panic attack can be similar to symptoms of a heart attack or stroke. It is important to see your health care provider when you have a panic attack so that these conditions can be ruled out. A panic attack is a symptom of another condition. Most panic attacks go away with treatment of the underlying problem. If you have panic attacks often, you may have a condition called panic disorder. What are the causes? A panic attack may be caused by:  An extreme, life-threatening situation, such as a war or natural disaster.  An anxiety disorder, such as post-traumatic stress disorder.  Depression.  Certain medical conditions, including heart problems, neurological conditions, and infections.  Certain over-the-counter and prescription medicines.  Illegal drugs that increase heart rate and blood pressure, such as methamphetamine.  Alcohol.  Supplements that increase anxiety.  Panic disorder. What increases the risk? You are more likely to develop this condition if:  You have an anxiety disorder.  You have another mental health condition.  You take certain medicines.  You use alcohol, illegal drugs, or other substances.  You are under extreme stress.  A life event is causing increased feelings of anxiety and depression. What are the signs or symptoms? A panic attack starts suddenly, usually lasts about 20 minutes, and occurs with one or more of the following:  A pounding heart.  A feeling that your  heart is beating irregularly or faster than normal (palpitations).  Sweating.  Trembling or shaking.  Shortness of breath or feeling smothered.  Feeling choked.  Chest pain or discomfort.  Nausea or a strange feeling in your stomach.  Dizziness, feeling lightheaded, or feeling like you might faint.  Chills or hot flashes.  Numbness or tingling in your lips, hands, or feet.  Feeling confused, or feeling that you are not yourself.  Fear of losing control or being emotionally unstable.  Fear of dying. How is this diagnosed? A panic attack is diagnosed with an assessment by your health care provider. During the assessment your health care provider will ask questions about:  Your history of anxiety, depression, and panic attacks.  Your medical history.  Whether you drink alcohol, use illegal drugs, take supplements, or take medicines. Be honest about your substance use. Your health care provider may also:  Order blood tests or other kinds of tests to rule out serious medical conditions.  Refer you to a mental health professional for further evaluation.   How is this treated? Treatment depends on the cause of the panic attack:  If the cause is a medical problem, your health care provider will either treat that problem or refer you to a specialist.  If the cause is emotional, you may be given anti-anxiety medicines or referred to a counselor. These medicines may reduce how often attacks happen, reduce how severe the attacks are, and lower anxiety.  If the cause is a medicine, your health care provider may tell you to stop the medicine, change your dose, or take  a different medicine.  If the cause is a drug, treatment may involve letting the drug wear off and taking medicine to help the drug leave your body or to counteract its effects. Attacks caused by drug abuse may continue even if you stop using the drug. Follow these instructions at home:  Take over-the-counter and  prescription medicines only as told by your health care provider.  If you feel anxious, limit your caffeine intake.  Take good care of your physical and mental health by: ? Eating a balanced diet that includes plenty of fresh fruits and vegetables, whole grains, lean meats, and low-fat dairy. ? Getting plenty of rest. Try to get 7-8 hours of uninterrupted sleep each night. ? Exercising regularly. Try to get 30 minutes of physical activity at least 5 days a week. ? Not smoking. Talk to your health care provider if you need help quitting. ? Limiting alcohol intake to no more than 1 drink a day for nonpregnant women and 2 drinks a day for men. One drink equals 12 oz of beer, 5 oz of wine, or 1 oz of hard liquor.  Keep all follow-up visits as told by your health care provider. This is important. Panic attacks may have underlying physical or emotional problems that take time to accurately diagnose. Contact a health care provider if:  Your symptoms do not improve, or they get worse.  You are not able to take your medicine as prescribed because of side effects. Get help right away if:  You have serious thoughts about hurting yourself or others.  You have symptoms of a panic attack. Do not drive yourself to the hospital. Have someone else drive you or call an ambulance. If you ever feel like you may hurt yourself or others, or you have thoughts about taking your own life, get help right away. You can go to your nearest emergency department or call:  Your local emergency services (911 in the [place]  A suicide crisis helpline, such as the [facility] at 669-445-9883. This is open 24 hours a day. Summary  A panic attack is a sign of a serious health or mental health condition. Get help right away. Do not drive yourself to the hospital. Have someone else drive you or call an ambulance.  Always see a health care provider to have the reasons for the panic attack correctly  diagnosed.  If your panic attack was caused by a physical problem, follow your health care provider's suggestions for medicine, referral to a specialist, and lifestyle changes.  If your panic attack was caused by an emotional problem, follow through with counseling from a qualified mental health specialist.  If you feel like you may hurt yourself or others, call 911 and get help right away. This information is not intended to replace advice given to you by your health care provider. Make sure you discuss any questions you have with your health care provider. Document Revised: 11/23/2019 Document Reviewed: 11/23/2019 Elsevier Patient Education  [place].

## 2020-10-18 ENCOUNTER — Telehealth: Payer: Self-pay

## 2020-10-18 NOTE — Telephone Encounter (Signed)
Patients wife states the patient is expiereincing negative side effects from citalopram (CELEXA) 10 MG tablet  she states he has been aggressive and violent and has not been sleeping and states he wants to hurt someone   He has taken a xanax this morning and is now sleeping but patients wife would like to know if he needs to stop taking it and we can try another medication or what they need to do next  Please advise

## 2020-10-18 NOTE — Telephone Encounter (Signed)
Spoke with patient and his wife. Gave a verbal understanding to stop the medication. Will update [place] as needed with other side effects.

## 2020-10-18 NOTE — Telephone Encounter (Signed)
Please advise 

## 2020-10-18 NOTE — Telephone Encounter (Signed)
Ok to stop the medication.   See if his manner returns to his normal. If remains abnormal, recommend follow up with neurology.   If returns to normal/depressed, let me know and I will try a different medication.   We may want to have him evaluated by psychiatry if needed as well.

## 2020-10-21 ENCOUNTER — Encounter: Payer: Self-pay | Admitting: Family Medicine

## 2020-10-21 ENCOUNTER — Ambulatory Visit (INDEPENDENT_AMBULATORY_CARE_PROVIDER_SITE_OTHER): Payer: Medicare HMO | Admitting: Family Medicine

## 2020-10-21 ENCOUNTER — Telehealth: Payer: Self-pay | Admitting: Neurology

## 2020-10-21 ENCOUNTER — Telehealth: Payer: Self-pay

## 2020-10-21 ENCOUNTER — Other Ambulatory Visit: Payer: Self-pay

## 2020-10-21 VITALS — BP 120/68 | HR 65 | Temp 97.6°F | Ht 70.0 in | Wt 215.4 lb

## 2020-10-21 DIAGNOSIS — F410 Panic disorder [episodic paroxysmal anxiety] without agoraphobia: Secondary | ICD-10-CM | POA: Diagnosis not present

## 2020-10-21 DIAGNOSIS — R410 Disorientation, unspecified: Secondary | ICD-10-CM | POA: Diagnosis not present

## 2020-10-21 DIAGNOSIS — R443 Hallucinations, unspecified: Secondary | ICD-10-CM

## 2020-10-21 DIAGNOSIS — G3184 Mild cognitive impairment, so stated: Secondary | ICD-10-CM

## 2020-10-21 LAB — CBC WITH DIFFERENTIAL/PLATELET
Basophils Absolute: 0 10*3/uL (ref 0.0–0.1)
Basophils Relative: 1 % (ref 0.0–3.0)
Eosinophils Absolute: 0.1 10*3/uL (ref 0.0–0.7)
Eosinophils Relative: 2 % (ref 0.0–5.0)
HCT: 37.9 % — ABNORMAL LOW (ref 39.0–52.0)
Hemoglobin: 13.1 g/dL (ref 13.0–17.0)
Lymphocytes Relative: 16.9 % (ref 12.0–46.0)
Lymphs Abs: 0.6 10*3/uL — ABNORMAL LOW (ref 0.7–4.0)
MCHC: 34.5 g/dL (ref 30.0–36.0)
MCV: 93.5 fl (ref 78.0–100.0)
Monocytes Absolute: 0.4 10*3/uL (ref 0.1–1.0)
Monocytes Relative: 10.2 % (ref 3.0–12.0)
Neutro Abs: 2.6 10*3/uL (ref 1.4–7.7)
Neutrophils Relative %: 69.9 % (ref 43.0–77.0)
Platelets: 188 10*3/uL (ref 150.0–400.0)
RBC: 4.05 Mil/uL — ABNORMAL LOW (ref 4.22–5.81)
RDW: 13.9 % (ref 11.5–15.5)
WBC: 3.7 10*3/uL — ABNORMAL LOW (ref 4.0–10.5)

## 2020-10-21 LAB — POCT URINALYSIS DIPSTICK
Bilirubin, UA: NEGATIVE
Blood, UA: NEGATIVE
Glucose, UA: NEGATIVE
Ketones, UA: NEGATIVE
Leukocytes, UA: NEGATIVE
Nitrite, UA: NEGATIVE
Protein, UA: POSITIVE — AB
Spec Grav, UA: 1.02 (ref 1.010–1.025)
Urobilinogen, UA: 1 E.U./dL
pH, UA: 6 (ref 5.0–8.0)

## 2020-10-21 LAB — TSH: TSH: 1.12 u[IU]/mL (ref 0.35–4.50)

## 2020-10-21 LAB — COMPREHENSIVE METABOLIC PANEL
ALT: 11 U/L (ref 0–53)
AST: 11 U/L (ref 0–37)
Albumin: 4.5 g/dL (ref 3.5–5.2)
Alkaline Phosphatase: 60 U/L (ref 39–117)
BUN: 14 mg/dL (ref 6–23)
CO2: 28 mEq/L (ref 19–32)
Calcium: 9.5 mg/dL (ref 8.4–10.5)
Chloride: 106 mEq/L (ref 96–112)
Creatinine, Ser: 0.99 mg/dL (ref 0.40–1.50)
GFR: 71.94 mL/min (ref >60.00)
Glucose, Bld: 95 mg/dL (ref 70–99)
Potassium: 4.6 mEq/L (ref 3.5–5.1)
Sodium: 141 mEq/L (ref 135–145)
Total Bilirubin: 0.7 mg/dL (ref 0.2–1.2)
Total Protein: 6.2 g/dL (ref 6.0–8.3)

## 2020-10-21 LAB — AMMONIA: Ammonia: 33 umol/L (ref 11–35)

## 2020-10-21 LAB — B12 AND FOLATE PANEL
Folate: >24.4 ng/mL (ref >5.9)
Vitamin B-12: 1147 pg/mL — ABNORMAL HIGH (ref 211–911)

## 2020-10-21 NOTE — Patient Instructions (Signed)
Please follow up if symptoms do not improve or as needed.   I will release your lab results to you on your MyChart account with further instructions. Please reply with any questions.   Please schedule with neurology.  I have placed a referral to psychiatry.   You may use the xanax as needed.

## 2020-10-21 NOTE — Telephone Encounter (Signed)
Patient coming in for a 9am appt.

## 2020-10-21 NOTE — Telephone Encounter (Signed)
Called patient who was in the car with his wife ,she was describing.  Complaining within the last 2 to 3 weeks of aggressiveness, hallucinations,panic attacks. Triggers are news, violent shows. He has seen pcp had UA, labs, (pending results) this am.  Referral for psychiatry was made.  Neuropsych scheduled in July 2022.  He states that it is in the middle of the night that he will wake or is in sort of like a [place].  He must thrash around because he is afraid of hurting his wife.  No availability at this time.  Placed on cancellation list.  Relayed to not watch news/violence on TV. Taking namenda and aricept routinely.

## 2020-10-21 NOTE — Telephone Encounter (Signed)
Pt's wife [place] on [place] called needing RN to call her back to discuss drastic behavioral changes the pt is having. She states that he has been having panic attacks, Confusion, aggression and Hallucination. She would like to have the pt seen sooner than scheduled appt. I checked providers schedule to see if there were any cx but nothing sooner came up. Please advise.

## 2020-10-21 NOTE — Telephone Encounter (Signed)
Good idea to see PCP, if acute concerning changes go to ER. Will await PCP work-up, agree avoid violent shows/news if trigger. Off clonazepam, if these are nighttime issues, we could add back in PRN. They could call neuro psych to see if could be moved up, but acutely may be hard to access. Let's wait to see if UTI?

## 2020-10-21 NOTE — Telephone Encounter (Signed)
Patients wife called in and stated she thinks her husband might have a UTI and she would like for him to drop by and leave a sample. Patient would like to get it done this morning.

## 2020-10-21 NOTE — Telephone Encounter (Signed)
Left message voicemail for patient and his wife concerning behavioral,  aggressiveness and panic attacks.  Per Judson Roch good to have gone to primary care we will wait to see these urinalysis and lab results show.  Can call neuropsychology to see if they have a cancellation can move you up to a sooner appointment.  She mentioned clonazepam if this is something that you are not taking at the moment but you have been on before she said we could add it back for you to take at night as needed.  You will have to let me know.  Otherwise if there are acute changes you can call 911 or go to the ED for evaluation.

## 2020-10-21 NOTE — Progress Notes (Signed)
Subjective  CC:  Chief Complaint  Patient presents with  . Urinary Tract Infection    Patient thinks he has a UTI, no UTI symptoms.  . Depression  . Anxiety    HPI: Caleb Gibson is a 80 y.o. male who presents to the office today to address the problems listed above in the chief complaint.  Caleb Gibson is doing worse: see last note. Took the celexa and felt more aggressive. Now remains flat, tired, unmotivated but reports auditory and visual hallucinations at night, intolerance to "negative tv" - causes acute panic and need to "leave the room", feels angry at times. Not interactive like usual and has intermittent confusion. No urinary sxs but would like to r/o UTI as cause of acute worsening confusion. Has used xanax once for aggressive panic and once for sleep. Does not want to retry mood medication. Seeing neurology for ongoing monitoring of cognitive impairment on namenda and aricept.  Has had brain MRI showing atrophy. Has neuropsych testing scheduled for July.   Has had claustrophobia on celexa for years. Mother had mental health issues. No known fh of dementia  Denies any other physical complaints.   Assessment  1. Confusion   2. Mild cognitive impairment   3. Panic attacks   4. Hallucinations      Plan   Confusion, panic, anxiety and hallucinations:  R/o organic cause. Urine does not look infected. Clinically stable. ? Progressive dementia vs pscyh disorder. rec f/u with neuro now and refer to psych for their input.   Follow up: prn   11/12/2020  Orders Placed This Encounter  Procedures  . Urine Culture  . CBC with Differential/Platelet  . Comprehensive metabolic panel  . TSH  . Ammonia  . B12 and Folate Panel  . Ambulatory referral to Psychiatry  . POCT Urinalysis Dipstick   No orders of the defined types were placed in this encounter.     I reviewed the patients updated PMH, FH, and SocHx.    Patient Active Problem List   Diagnosis Date Noted  . Mild cognitive  impairment 05/10/2018    Priority: High  . Prostate cancer ([place]) 11/22/2017    Priority: High  . Tubular adenoma of colon 06/15/2017    Priority: High  . On statin therapy due to risk of future cardiovascular event 10/08/2015    Priority: High  . Mixed hyperlipidemia 09/27/2014    Priority: High  . History of traumatic head injury 08/04/2018    Priority: Medium  . Idiopathic hypotension 01/07/2016    Priority: Medium  . BPH (benign prostatic hyperplasia) 10/01/2011    Priority: Medium  . Erectile dysfunction 10/01/2011    Priority: Medium  . Osteoarthritis, knee 08/01/2010    Priority: Medium  . Claustrophobia 07/08/2009    Priority: Medium  . Basal cell carcinoma of skin 06/15/2017    Priority: Low  . Simple cyst of kidney 11/12/2011    Priority: Low  . Allergic rhinitis 08/27/2011    Priority: Low  . Sleep disturbance 04/08/2020  . Memory loss 08/04/2018   Current Meds  Medication Sig  . ALPRAZolam (XANAX) 0.5 MG tablet Take 1 tablet (0.5 mg total) by mouth daily as needed for anxiety.  . Amino Acids (L-CARNITINE PO) Take 250 mg by mouth daily.   Marland Kitchen CALCIUM CITRATE PO Take 600 mg by mouth daily.   . cholecalciferol (VITAMIN D) 1000 units tablet Take 1,000 Units by mouth daily.   Marland Kitchen donepezil (ARICEPT) 10 MG tablet Take 1 tablet (  10 mg total) by mouth daily.  . Fluticasone Propionate (FLONASE NA) Place into the nose.  Marland Kitchen glucosamine-chondroitin 500-400 MG tablet Take 1 tablet by mouth daily.   . Lycopene 10 MG CAPS Take 1 capsule by mouth daily.   . memantine (NAMENDA) 10 MG tablet Take 1 tablet (10 mg total) by mouth 2 (two) times daily.  . Multiple Vitamins-Minerals (CENTRUM SILVER) tablet Take 1 tablet by mouth daily.   . Multiple Vitamins-Minerals (EYE VITAMINS PO)   . Omega-3 Fatty Acids (FISH OIL) 1200 MG CAPS Take by mouth.  . rosuvastatin (CRESTOR) 5 MG tablet TAKE 1/2 TABLET EVERY DAY  . tamsulosin (FLOMAX) 0.4 MG CAPS capsule Take 0.4 mg by mouth.  . vitamin C  (ASCORBIC ACID) 500 MG tablet Take 500 mg by mouth daily.     Allergies: Patient is allergic to viagra [sildenafil citrate]. Family History: Patient family history includes Alcohol abuse in his maternal aunt and mother; Cirrhosis in his maternal aunt; Diabetes in his father; Glaucoma in his father; Heart disease in his maternal grandmother; Lymphoma in his mother; Melanoma in his mother; Prostate cancer in his father. Social History:  Patient  reports that he quit smoking about 26 years ago. His smoking use included cigarettes. He has never used smokeless tobacco. He reports current alcohol use. He reports that he does not use drugs.  Review of Systems: Constitutional: Negative for fever malaise or anorexia Cardiovascular: negative for chest pain Respiratory: negative for SOB or persistent cough Gastrointestinal: negative for abdominal pain  Objective  Vitals: BP 120/68   Pulse 65   Temp 97.6 F (36.4 C) (Temporal)   Ht 5\' 10"  (1.778 m)   Wt 215 lb 6.4 oz (97.7 kg)   SpO2 97%   BMI 30.91 kg/m  General: no acute distress , A&Ox3, appears calm. Psych: normal speech, fair insight, flat affect  Office Visit on 10/21/2020  Component Date Value Ref Range Status  . Color, UA 10/21/2020 dark yellow   Final  . Clarity, UA 10/21/2020 cloudy   Final  . Glucose, UA 10/21/2020 Negative  Negative Final  . Bilirubin, UA 10/21/2020 neg   Final  . Ketones, UA 10/21/2020 neg   Final  . Spec Grav, UA 10/21/2020 1.020  1.010 - 1.025 Final  . Blood, UA 10/21/2020 neg   Final  . pH, UA 10/21/2020 6.0  5.0 - 8.0 Final  . Protein, UA 10/21/2020 Positive* Negative Final  . Urobilinogen, UA 10/21/2020 1.0  0.2 or 1.0 E.U./dL Final  . Nitrite, UA 10/21/2020 neg   Final  . Leukocytes, UA 10/21/2020 Negative  Negative Final     Commons side effects, risks, benefits, and alternatives for medications and treatment plan prescribed today were discussed, and the patient expressed understanding of the  given instructions. Patient is instructed to call or message via MyChart if he/she has any questions or concerns regarding our treatment plan. No barriers to understanding were identified. We discussed Red Flag symptoms and signs in detail. Patient expressed understanding regarding what to do in case of urgent or emergency type symptoms.   Medication list was reconciled, printed and provided to the patient in AVS. Patient instructions and summary information was reviewed with the patient as documented in the AVS. This note was prepared with assistance of Dragon voice recognition software. Occasional wrong-word or sound-a-like substitutions may have occurred due to the inherent limitations of voice recognition software  This visit occurred during the SARS-CoV-2 public health emergency.  Safety protocols were in  place, including screening questions prior to the visit, additional usage of staff PPE, and extensive cleaning of exam room while observing appropriate contact time as indicated for disinfecting solutions.

## 2020-10-21 NOTE — Telephone Encounter (Signed)
This is more called back.  I gave her destructions per Judson Roch.  Patient is on Xanax 0.5mg  (take 1/2 to 1 prn) anxiety from 10/15/20.  He took 1 last night and slept well and she was going to give him half a tablet tonight and see how he does.  He does have an appointment with psychiatry next week with Dr. Gerald Leitz.    I will keep him on my cancellation list for an earlier appointment.  She will call neuropsych to see if can be seen sooner if they have a cancellation.  And will let us know about lab results.  He had a urine dipstick which was negative but urine culture was done in that is pending.

## 2020-10-21 NOTE — Telephone Encounter (Signed)
Will see if patient is available to come in for an OV today.

## 2020-10-22 LAB — URINE CULTURE
MICRO NUMBER:: 11894104
SPECIMEN QUALITY:: ADEQUATE

## 2020-10-22 NOTE — Progress Notes (Signed)
Please call patient: I have reviewed his/her lab results. Please ensure pt is not taking an OTC vitamin b12 supplement. His b12 levels remain high. Folate level is also high. Need to decrease folate and b12 supplements.  Other labs are stable. Anemia has improved.  Normal liver, renal and electrolytes. Normal thyroid. Still awaiting urine culture.

## 2020-10-23 ENCOUNTER — Telehealth: Payer: Self-pay | Admitting: Neurology

## 2020-10-23 NOTE — Telephone Encounter (Signed)
Called pt.  He has been taking 1/2 tab xanax (0.5mg ) nightly and has had no reoccurences.  His labs in epic ok, no UTI/ cx negative.   Still notes aggressive feeling when watches violent shows.  Worries.  wifes states he's flat,   Appetite decreased, I relayed eating regularly helps with blood glucose level to stay even range and helps with way he feels.  Noted that had hazzucination about 2 wks ago saw wife floating/vivid dream.  No more since that.  Did not tolerate citalopram (when retried by pcp).  Has appt with psychiatry next Tuesday.  Still on wait list.  If anything more for them after sending message to SS/NP will let them know.

## 2020-10-23 NOTE — Telephone Encounter (Signed)
Labs were unrevealing with PCP. Sounds as though things are better. Agree with plan to see psychiatry next week. He has a lot of anxiety.

## 2020-10-23 NOTE — Progress Notes (Signed)
Please call patient: I have reviewed his/her lab results. Urine culture is negative for infection.

## 2020-10-23 NOTE — Telephone Encounter (Signed)
No other changes will not call back at this time, unless cancellation come up.

## 2020-10-23 NOTE — Telephone Encounter (Signed)
Pt has called to inform that he has the results to labs and would like a call on his mobile to discuss with RN

## 2020-10-23 NOTE — Telephone Encounter (Signed)
Error

## 2020-10-29 DIAGNOSIS — G3184 Mild cognitive impairment, so stated: Secondary | ICD-10-CM | POA: Diagnosis not present

## 2020-10-29 DIAGNOSIS — F419 Anxiety disorder, unspecified: Secondary | ICD-10-CM | POA: Diagnosis not present

## 2020-10-29 DIAGNOSIS — Z79891 Long term (current) use of opiate analgesic: Secondary | ICD-10-CM | POA: Diagnosis not present

## 2020-11-12 ENCOUNTER — Other Ambulatory Visit: Payer: Self-pay

## 2020-11-12 ENCOUNTER — Ambulatory Visit (INDEPENDENT_AMBULATORY_CARE_PROVIDER_SITE_OTHER): Payer: Medicare HMO | Admitting: Family Medicine

## 2020-11-12 ENCOUNTER — Encounter: Payer: Self-pay | Admitting: Family Medicine

## 2020-11-12 VITALS — BP 121/77 | HR 70 | Temp 98.4°F | Ht 70.0 in | Wt 215.6 lb

## 2020-11-12 DIAGNOSIS — G3184 Mild cognitive impairment, so stated: Secondary | ICD-10-CM | POA: Diagnosis not present

## 2020-11-12 DIAGNOSIS — R443 Hallucinations, unspecified: Secondary | ICD-10-CM | POA: Diagnosis not present

## 2020-11-12 DIAGNOSIS — F411 Generalized anxiety disorder: Secondary | ICD-10-CM | POA: Diagnosis not present

## 2020-11-12 NOTE — Progress Notes (Signed)
Subjective  CC:  Chief Complaint  Patient presents with  . Memory Loss    About the same, but overall moods have improved    HPI: Caleb Gibson is a 80 y.o. male who presents to the office today to address the problems listed above in the chief complaint.  Follow-up anxiety hallucinations: Please see last note.  Since, he is much improved.  He did use Xanax in the short-term to help with his anxiety symptoms.  Neurology is aware of his symptoms.  He saw psychiatry 2 weeks ago and they started Seroquel.  Fortunately he has had a good response to this.  He is sleeping well now.  No longer having hallucinations.  No aggressive thoughts.  Feels more back to baseline.  He has follow-up in 2 weeks with the psychiatric nurse practitioner.   Assessment  1. GAD (generalized anxiety disorder)   2. Mild cognitive impairment   3. Hallucinations      Plan   Anxiety with secondary symptoms much improved: Continue Seroquel per psychiatry.  No other antidepressants or SSRIs have been initiated.  Appreciate help.  We will request records  He has follow-up with neurology as well.  Continue Namenda and Aricept.  Follow up: January for complete physical Visit date not found  No orders of the defined types were placed in this encounter.  No orders of the defined types were placed in this encounter.     I reviewed the patients updated PMH, FH, and SocHx.    Patient Active Problem List   Diagnosis Date Noted  . Mild cognitive impairment 05/10/2018    Priority: High  . Prostate cancer ([place]) 11/22/2017    Priority: High  . Tubular adenoma of colon 06/15/2017    Priority: High  . On statin therapy due to risk of future cardiovascular event 10/08/2015    Priority: High  . Mixed hyperlipidemia 09/27/2014    Priority: High  . History of traumatic head injury 08/04/2018    Priority: Medium  . Idiopathic hypotension 01/07/2016    Priority: Medium  . BPH (benign prostatic hyperplasia)  10/01/2011    Priority: Medium  . Erectile dysfunction 10/01/2011    Priority: Medium  . Osteoarthritis, knee 08/01/2010    Priority: Medium  . Claustrophobia 07/08/2009    Priority: Medium  . Basal cell carcinoma of skin 06/15/2017    Priority: Low  . Simple cyst of kidney 11/12/2011    Priority: Low  . Allergic rhinitis 08/27/2011    Priority: Low  . Sleep disturbance 04/08/2020  . Memory loss 08/04/2018   Current Meds  Medication Sig  . ALPRAZolam (XANAX) 0.5 MG tablet Take 1 tablet (0.5 mg total) by mouth daily as needed for anxiety.  . Amino Acids (L-CARNITINE PO) Take 250 mg by mouth daily.   Marland Kitchen CALCIUM CITRATE PO Take 600 mg by mouth daily.   . cholecalciferol (VITAMIN D) 1000 units tablet Take 1,000 Units by mouth daily.   Marland Kitchen donepezil (ARICEPT) 10 MG tablet Take 1 tablet (10 mg total) by mouth daily.  . Fluticasone Propionate (FLONASE NA) Place into the nose.  Marland Kitchen glucosamine-chondroitin 500-400 MG tablet Take 1 tablet by mouth daily.   . Lycopene 10 MG CAPS Take 1 capsule by mouth daily.   . memantine (NAMENDA) 10 MG tablet Take 1 tablet (10 mg total) by mouth 2 (two) times daily.  . Multiple Vitamins-Minerals (CENTRUM SILVER) tablet Take 1 tablet by mouth daily.   . Multiple Vitamins-Minerals (EYE VITAMINS PO)   .  Omega-3 Fatty Acids (FISH OIL) 1200 MG CAPS Take by mouth.  . QUEtiapine (SEROQUEL) 25 MG tablet Take 25 mg by mouth 2 (two) times daily.  . rosuvastatin (CRESTOR) 5 MG tablet TAKE 1/2 TABLET EVERY DAY  . tamsulosin (FLOMAX) 0.4 MG CAPS capsule Take 0.4 mg by mouth.  . vitamin C (ASCORBIC ACID) 500 MG tablet Take 500 mg by mouth daily.     Allergies: Patient is allergic to viagra [sildenafil citrate]. Family History: Patient family history includes Alcohol abuse in his maternal aunt and mother; Cirrhosis in his maternal aunt; Diabetes in his father; Glaucoma in his father; Heart disease in his maternal grandmother; Lymphoma in his mother; Melanoma in his  mother; Prostate cancer in his father. Social History:  Patient  reports that he quit smoking about 26 years ago. His smoking use included cigarettes. He has never used smokeless tobacco. He reports current alcohol use. He reports that he does not use drugs.  Review of Systems: Constitutional: Negative for fever malaise or anorexia Cardiovascular: negative for chest pain Respiratory: negative for SOB or persistent cough Gastrointestinal: negative for abdominal pain  Objective  Vitals: BP 121/77   Pulse 70   Temp 98.4 F (36.9 C) (Temporal)   Ht 5\' 10"  (1.778 m)   Wt 215 lb 9.6 oz (97.8 kg)   SpO2 96%   BMI 30.94 kg/m  General: no acute distress , A&Ox3 Psych: Calm, normal affect.  Normal speech.     Commons side effects, risks, benefits, and alternatives for medications and treatment plan prescribed today were discussed, and the patient expressed understanding of the given instructions. Patient is instructed to call or message via MyChart if he/she has any questions or concerns regarding our treatment plan. No barriers to understanding were identified. We discussed Red Flag symptoms and signs in detail. Patient expressed understanding regarding what to do in case of urgent or emergency type symptoms.   Medication list was reconciled, printed and provided to the patient in AVS. Patient instructions and summary information was reviewed with the patient as documented in the AVS. This note was prepared with assistance of Dragon voice recognition software. Occasional wrong-word or sound-a-like substitutions may have occurred due to the inherent limitations of voice recognition software  This visit occurred during the SARS-CoV-2 public health emergency.  Safety protocols were in place, including screening questions prior to the visit, additional usage of staff PPE, and extensive cleaning of exam room while observing appropriate contact time as indicated for disinfecting solutions.

## 2020-11-29 DIAGNOSIS — F419 Anxiety disorder, unspecified: Secondary | ICD-10-CM | POA: Diagnosis not present

## 2020-11-29 DIAGNOSIS — G3184 Mild cognitive impairment, so stated: Secondary | ICD-10-CM | POA: Diagnosis not present

## 2020-12-03 DIAGNOSIS — Z20828 Contact with and (suspected) exposure to other viral communicable diseases: Secondary | ICD-10-CM | POA: Diagnosis not present

## 2020-12-03 DIAGNOSIS — D1801 Hemangioma of skin and subcutaneous tissue: Secondary | ICD-10-CM | POA: Diagnosis not present

## 2020-12-03 DIAGNOSIS — D485 Neoplasm of uncertain behavior of skin: Secondary | ICD-10-CM | POA: Diagnosis not present

## 2020-12-03 DIAGNOSIS — L821 Other seborrheic keratosis: Secondary | ICD-10-CM | POA: Diagnosis not present

## 2020-12-03 DIAGNOSIS — L570 Actinic keratosis: Secondary | ICD-10-CM | POA: Diagnosis not present

## 2020-12-03 DIAGNOSIS — C44619 Basal cell carcinoma of skin of left upper limb, including shoulder: Secondary | ICD-10-CM | POA: Diagnosis not present

## 2020-12-03 DIAGNOSIS — Z85828 Personal history of other malignant neoplasm of skin: Secondary | ICD-10-CM | POA: Diagnosis not present

## 2020-12-03 DIAGNOSIS — L814 Other melanin hyperpigmentation: Secondary | ICD-10-CM | POA: Diagnosis not present

## 2020-12-16 ENCOUNTER — Telehealth: Payer: Self-pay | Admitting: Family Medicine

## 2020-12-16 NOTE — Telephone Encounter (Signed)
Copied from [place] [id number]. Topic: Medicare AWV >> Dec 16, 2020 11:32 AM Harris-Coley, Hannah Beat wrote: Reason for CRM: Left message for patient to schedule Annual Wellness Visit.  Please schedule with Nurse Health Advisor Charlott Rakes, RN at [facility].

## 2020-12-19 ENCOUNTER — Ambulatory Visit: Payer: Medicare HMO | Admitting: Psychology

## 2020-12-19 ENCOUNTER — Ambulatory Visit (INDEPENDENT_AMBULATORY_CARE_PROVIDER_SITE_OTHER): Payer: Medicare HMO | Admitting: Psychology

## 2020-12-19 ENCOUNTER — Encounter: Payer: Self-pay | Admitting: Psychology

## 2020-12-19 ENCOUNTER — Other Ambulatory Visit: Payer: Self-pay

## 2020-12-19 DIAGNOSIS — G3184 Mild cognitive impairment, so stated: Secondary | ICD-10-CM

## 2020-12-19 DIAGNOSIS — R4189 Other symptoms and signs involving cognitive functions and awareness: Secondary | ICD-10-CM

## 2020-12-19 HISTORY — DX: Mild cognitive impairment of uncertain or unknown etiology: G31.84

## 2020-12-19 NOTE — Progress Notes (Signed)
   Psychometrician Note   Cognitive testing was administered to Caleb Gibson by Milana Kidney, B.S. (psychometrist) under the supervision of Dr. Christia Reading, Ph.D., licensed psychologist on 12/19/20. Caleb Gibson did not appear overtly distressed by the testing session per behavioral observation or responses across self-report questionnaires. Rest breaks were offered.    The battery of tests administered was selected by Dr. Christia Reading, Ph.D. with consideration to Caleb Gibson's current level of functioning, the nature of his symptoms, emotional and behavioral responses during interview, level of literacy, observed level of motivation/effort, and the nature of the referral question. This battery was communicated to the psychometrist. Communication between Dr. Christia Reading, Ph.D. and the psychometrist was ongoing throughout the evaluation and Dr. Christia Reading, Ph.D. was immediately accessible at all times. Dr. Christia Reading, Ph.D. provided supervision to the psychometrist on the date of this service to the extent necessary to assure the quality of all services provided.    Caleb Gibson will return within approximately 1-2 weeks for an interactive feedback session with Caleb Gibson at which time his test performances, clinical impressions, and treatment recommendations will be reviewed in detail. Caleb Gibson understands he can contact our office should he require our assistance before this time.  A total of 120 minutes of billable time were spent face-to-face with Caleb Gibson by the psychometrist. This includes both test administration and scoring time. Billing for these services is reflected in the clinical report generated by Dr. Christia Reading, Ph.D.  This note reflects time spent with the psychometrician and does not include test scores or any clinical interpretations made by Caleb Gibson. The full report will follow in a separate note.

## 2020-12-19 NOTE — Progress Notes (Signed)
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 abilities based upon educational and vocational attainment were estimated to be in the average range. Premorbid abilities were estimated to be within the average range based upon a single-word reading test.   Processing speed was below average to average. Basic attention was well above average. More complex attention (e.g., working memory) was variable, ranging from the well below average to average normative ranges. Executive functioning was well below average to below average. Performance on a task assessing safety and judgment was above average  Assessed receptive language abilities were above average. Likewise, Mr. Gumz did not exhibit any difficulties comprehending task instructions and answered all questions asked of him appropriately. Assessed expressive language was somewhat variable. While phonemic fluency and confrontation naming were average to above average, semantic fluency was well below average.     Assessed visuospatial/visuoconstructional abilities were below average to average. Points were lost on his drawing of a clock due to incorrect hand placement.    Learning (i.e., encoding) of novel verbal information was variable, ranging from the well below average to average normative ranges. Spontaneous delayed recall (i.e., retrieval) of previously learned  information was below average to average. Retention rates were 100% across a story learning task, 17% (raw score of 1) across a list learning task, and 56% across a figure drawing task. Performance across recognition tasks was variable, ranging from the exceptionally low to average normative ranges, suggesting some evidence for information consolidation.   Results of emotional screening instruments suggested that recent symptoms of generalized anxiety were in the moderate range, while symptoms of depression were within the mild range. A screening instrument assessing recent sleep quality suggested the presence of minimal sleep dysfunction.  Tables of Scores:   Note: This summary of test scores accompanies the interpretive report and should not be considered in isolation without reference to the appropriate sections in the text. Descriptors are based on appropriate normative data and may be adjusted based on clinical judgment. Terms such as "Within Normal Limits" and "Outside Normal Limits" are used when a more specific description of the test score cannot be determined.       Percentile - Normative Descriptor > 98 -  Exceptionally High 91-97 - Well Above Average 75-90 - Above Average 25-74 - Average 9-24 - Below Average 2-8 - Well Below Average < 2 - Exceptionally Low       Validity:   DESCRIPTOR       Dot Counting Test: --- --- Within Normal Limits  RBANS Effort Index: --- --- Within Normal Limits  WAIS-IV Reliable Digit Span: --- --- Within Normal Limits  D-KEFS Color Word Effort Index: --- --- Within Normal Limits       Orientation:      Raw Score Percentile   NAB Orientation, Form 1 28/29 --- ---       Cognitive Screening:      Raw Score Percentile   SLUMS: 16/30 --- ---       RBANS, Form A: Standard Score/ Scaled Score Percentile   Total Score 80 9 Below Average  Immediate Memory 81 10 Below Average    List Learning 8 25 Average    Story Memory 5 5 Well Below Average   Visuospatial/Constructional 81 10 Below Average    Figure Copy 8 25 Average    Line Orientation 12/20 3-9 Below Average  Language 92 30 Average    Picture Naming 10/10 >75 High Average    Semantic Fluency 6 9 Below Average  Attention 100 50 Average    Digit Span 14 91 Well Above Average    Coding 6 9 Below Average  Delayed Memory 71 3 Well Below Average    List Recall 1/10 10-16 Below Average    List Recognition 14/20 <2 Exceptionally Low    Story Recall 8 25 Average    Story Recognition 9/12 29-52 Average    Figure Recall 8 25 Average    Figure Recognition 2/8 1-5 Well Below Average       Intellectual Functioning:      Standard Score Percentile   Test of Premorbid Functioning: 93 32 Average       Attention/Executive Function:     Trail Making Test (TMT): Raw Score (Scaled Score) Percentile     Part A 58 secs.,  0 errors (8) 25 Average    Part B 234 secs.,  1 error (6) 9 Below Average  *Based on [employer] Older Normative Studies (MOANS)           Scaled Score Percentile   WAIS-IV Digit Span: 9 37 Average    Forward 14 85 Well Above Average    Backward 8 25 Average    Sequencing 5 5 Well Below Average        Scaled Score Percentile   WAIS-IV Similarities: 8 25 Average       D-KEFS Color-Word Interference Test: Raw Score (Scaled Score) Percentile     Color Naming 43 secs. (7) 16 Below Average    Word Reading 33 secs. (7) 16 Below Average    Inhibition 138 secs. (5) 5 Well Below Average      Total Errors 8 errors (6) 9 Below Average    Inhibition/Switching 126 secs. (7) 16 Below Average      Total Errors 3 errors (11) 63 Average       NAB Executive Functions Module, Form 1: T Score Percentile     Judgment 58 79 Above Average       Language:     Verbal Fluency Test: Raw Score (Scaled Score) Percentile     Phonemic Fluency (CFL) 28 (9) 37 Average    Category Fluency 23 (5) 5 Well Below Average  *  Based on [employer] Older Normative Studies (MOANS)          NAB Language  Module, Form 1: T Score Percentile     Auditory Comprehension 58 79 Above Average    Naming 30/31 (62) 88 Above Average       Visuospatial/Visuoconstruction:      Raw Score Percentile   Clock Drawing: 8/10 --- Within Normal Limits        Scaled Score Percentile   WAIS-IV Block Design: 10 50 Average       Mood and Personality:      Raw Score Percentile   Geriatric Depression Scale: 13 --- Mild  Geriatric Anxiety Scale: 24 --- Moderate    Somatic 6 --- Mild    Cognitive 10 --- Severe    Affective 8 --- Moderate       Additional Questionnaires:      Raw Score Percentile   PROMIS Sleep Disturbance Questionnaire: 9 --- None to Slight   Informed Consent and Coding/Compliance:   The current evaluation represents a clinical evaluation for the purposes previously outlined by the referral source and is in no way reflective of a forensic evaluation.   Mr. Morten was provided with a verbal description of the nature and purpose of the present neuropsychological evaluation. Also reviewed were the foreseeable risks and/or discomforts and benefits of the procedure, limits of confidentiality, and mandatory reporting requirements of this provider. The patient was given the opportunity to ask questions and receive answers about the evaluation. Oral consent to participate was provided by the patient.   This evaluation was conducted by Christia Reading, Ph.D., licensed clinical neuropsychologist. Mr. Gaba completed a clinical interview with Dr. Melvyn Novas, billed as one unit [phone], and 120 minutes of cognitive testing and scoring, billed as one unit [phone] and three additional units [id number]. Psychometrist Milana Kidney, B.S., assisted Dr. Melvyn Novas with test administration and scoring procedures. As a separate and discrete service, Dr. Melvyn Novas spent a total of 170 minutes in interpretation and report writing billed as one unit [phone] and two units 96133.

## 2020-12-23 ENCOUNTER — Telehealth: Payer: Self-pay | Admitting: Neurology

## 2020-12-23 DIAGNOSIS — G3184 Mild cognitive impairment, so stated: Secondary | ICD-10-CM

## 2020-12-23 NOTE — Telephone Encounter (Signed)
Got consult note from Dr. Melvyn Novas, felt criteria for Mild Neurocognitive Impairment, recommended repeat neuroimaging, had change in mental state back in May 2022. I can order MRI of the brain if he is willing and able, but claustrophobia is an issue. Can schedule earlier follow-up if needs to discuss results, we can talk about imaging at that time.

## 2020-12-23 NOTE — Addendum Note (Signed)
Addended by: Noberto Retort C on: 12/23/2020 04:17 PM   Modules accepted: Orders

## 2020-12-23 NOTE — Telephone Encounter (Signed)
I spoke to the patient's wife on DPR. She informed me they have discussed the need for the MRI brain scan and would like to move forward. She declined an appt prior to having the order placed. He has a prescription for alprazolam at home and will use it for his claustrophobia. His wife will be his driver that day. Per vo by Butler Denmark, NP, place order for MRI brain w/o.

## 2020-12-24 DIAGNOSIS — Z85828 Personal history of other malignant neoplasm of skin: Secondary | ICD-10-CM | POA: Diagnosis not present

## 2020-12-24 DIAGNOSIS — L988 Other specified disorders of the skin and subcutaneous tissue: Secondary | ICD-10-CM | POA: Diagnosis not present

## 2020-12-24 DIAGNOSIS — C44629 Squamous cell carcinoma of skin of left upper limb, including shoulder: Secondary | ICD-10-CM | POA: Diagnosis not present

## 2020-12-27 ENCOUNTER — Ambulatory Visit (INDEPENDENT_AMBULATORY_CARE_PROVIDER_SITE_OTHER): Payer: Medicare HMO | Admitting: Psychology

## 2020-12-27 ENCOUNTER — Other Ambulatory Visit: Payer: Self-pay

## 2020-12-27 DIAGNOSIS — G3184 Mild cognitive impairment, so stated: Secondary | ICD-10-CM | POA: Diagnosis not present

## 2020-12-27 DIAGNOSIS — F419 Anxiety disorder, unspecified: Secondary | ICD-10-CM | POA: Diagnosis not present

## 2020-12-27 NOTE — Progress Notes (Signed)
   Neuropsychology Feedback Session Caleb Gibson. [place] [facility]  Reason for Referral:   Caleb Gibson is a 80 y.o. right-handed Caucasian male referred by  Butler Denmark, NP , to characterize his current cognitive functioning and assist with diagnostic clarity and treatment planning in the context of subjective cognitive decline.  Feedback:   Caleb Gibson completed a comprehensive neuropsychological evaluation on 12/19/2020. Please refer to that encounter for the full report and recommendations. Briefly, results suggested an isolated impairment across semantic fluency, as well as a relative weakness across executive functioning. In addition to this, notable performance variability was exhibited across complex attention and all aspects of verbal memory. The etiology of dysfunction is unclear as his pattern across testing is nonspecific in nature. Specific to neurological etiologies, Caleb Gibson does not align well with any common conditions. Despite variability across memory testing, he did not exhibit rapid forgetting or a consistent memory storage deficit and scores were inconsistent with typically presenting Alzheimer's disease. Outside of REM sleep behaviors which have been present since at least 80 years old, other behavioral characteristics were not concerning for Lewy body dementia. Likewise, he did not exhibit pronounced executive functioning and visuospatial impairment across testing, also inconsistent with Lewy body dementia. He also does not align with concerns for frontotemporal dementia or Parkinson's disease.  Caleb Gibson was accompanied by his wife during the current telephone call. Content of the current session focused on the results of his neuropsychological evaluation. Caleb Gibson and his wife were given the opportunity to ask questions and their questions were answered. They were encouraged to reach out should additional questions arise. A copy of his report was  provided at the conclusion of the visit.      25 minutes were spent conducting the current feedback session with Caleb Gibson, billed as one unit [phone].

## 2020-12-27 NOTE — Addendum Note (Signed)
Addended by: Hazle Coca C on: 12/27/2020 02:25 PM   Modules accepted: Orders

## 2021-01-02 DIAGNOSIS — Z4802 Encounter for removal of sutures: Secondary | ICD-10-CM | POA: Diagnosis not present

## 2021-01-03 ENCOUNTER — Ambulatory Visit
Admission: RE | Admit: 2021-01-03 | Discharge: 2021-01-03 | Disposition: A | Discharge status: Home / Self Care | Payer: Medicare HMO | Source: Ambulatory Visit | Attending: Neurology | Admitting: Neurology

## 2021-01-03 ENCOUNTER — Other Ambulatory Visit: Payer: Self-pay

## 2021-01-03 DIAGNOSIS — G3184 Mild cognitive impairment, so stated: Secondary | ICD-10-CM | POA: Diagnosis not present

## 2021-01-03 DIAGNOSIS — R42 Dizziness and giddiness: Secondary | ICD-10-CM | POA: Diagnosis not present

## 2021-01-06 ENCOUNTER — Telehealth: Payer: Self-pay | Admitting: *Deleted

## 2021-01-06 NOTE — Telephone Encounter (Signed)
-----   Message from Wyvonnia Lora, RN sent at 01/06/2021 12:18 PM EDT -----  ----- Message ----- From: Britt Bottom, MD Sent: 01/06/2021   8:30 AM EDT To: Gna-[facility] 1 Results  Please let the patient know that the MRI shows age-related changes but nothing unexpected.

## 2021-01-06 NOTE — Telephone Encounter (Signed)
Spoke to patient and his wife. They verbalized understanding of the findings.

## 2021-01-24 DIAGNOSIS — F419 Anxiety disorder, unspecified: Secondary | ICD-10-CM | POA: Diagnosis not present

## 2021-01-24 DIAGNOSIS — G3184 Mild cognitive impairment, so stated: Secondary | ICD-10-CM | POA: Diagnosis not present

## 2021-01-27 ENCOUNTER — Other Ambulatory Visit: Payer: Self-pay | Admitting: Family Medicine

## 2021-01-27 DIAGNOSIS — H1013 Acute atopic conjunctivitis, bilateral: Secondary | ICD-10-CM | POA: Diagnosis not present

## 2021-01-27 DIAGNOSIS — H31091 Other chorioretinal scars, right eye: Secondary | ICD-10-CM | POA: Diagnosis not present

## 2021-01-27 DIAGNOSIS — H18513 Endothelial corneal dystrophy, bilateral: Secondary | ICD-10-CM | POA: Diagnosis not present

## 2021-01-27 DIAGNOSIS — Z961 Presence of intraocular lens: Secondary | ICD-10-CM | POA: Diagnosis not present

## 2021-01-27 DIAGNOSIS — H35412 Lattice degeneration of retina, left eye: Secondary | ICD-10-CM | POA: Diagnosis not present

## 2021-01-28 DIAGNOSIS — C61 Malignant neoplasm of prostate: Secondary | ICD-10-CM | POA: Diagnosis not present

## 2021-02-03 DIAGNOSIS — N400 Enlarged prostate without lower urinary tract symptoms: Secondary | ICD-10-CM | POA: Diagnosis not present

## 2021-02-03 DIAGNOSIS — N528 Other male erectile dysfunction: Secondary | ICD-10-CM | POA: Diagnosis not present

## 2021-02-03 DIAGNOSIS — C61 Malignant neoplasm of prostate: Secondary | ICD-10-CM | POA: Diagnosis not present

## 2021-02-24 DIAGNOSIS — F419 Anxiety disorder, unspecified: Secondary | ICD-10-CM | POA: Diagnosis not present

## 2021-02-24 DIAGNOSIS — G3184 Mild cognitive impairment, so stated: Secondary | ICD-10-CM | POA: Diagnosis not present

## 2021-03-13 ENCOUNTER — Other Ambulatory Visit: Payer: Self-pay

## 2021-03-13 ENCOUNTER — Telehealth (INDEPENDENT_AMBULATORY_CARE_PROVIDER_SITE_OTHER): Payer: Medicare HMO | Admitting: Family

## 2021-03-13 ENCOUNTER — Encounter: Payer: Self-pay | Admitting: Family

## 2021-03-13 ENCOUNTER — Telehealth: Payer: Self-pay | Admitting: Family Medicine

## 2021-03-13 DIAGNOSIS — U071 COVID-19: Secondary | ICD-10-CM | POA: Insufficient documentation

## 2021-03-13 MED ORDER — MOLNUPIRAVIR EUA 200MG CAPSULE
4.0000 | ORAL_CAPSULE | Freq: Two times a day (BID) | ORAL | 0 refills | Status: AC
Start: 2021-03-13 — End: 2021-03-18

## 2021-03-13 NOTE — Patient Instructions (Signed)
Start antiviral medication sent to your pharmacy. Isolate at home until no more symptoms, at least 5-7 days, wear mask in home to prevent spread to family. OK to take Tylenol for aches, pains, fever and Zyrtec for sinus symptoms. Call office if symptoms are not improving or worsening.

## 2021-03-13 NOTE — Chronic Care Management (AMB) (Signed)
  Care Management  Note   03/13/2021 Name: Caleb Gibson MRN: [id number] DOB: May 05, 1941  [name] is a 80 y.o. year old male who is a primary care patient of [name], MD. The care management team was consulted for assistance with chronic disease management and care coordination needs.   [name] was given information about Care Management services today including:  CCM service includes personalized support from designated clinical staff supervised by the physician, including individualized plan of care and coordination with other care providers 24/7 contact phone numbers for assistance for urgent and routine care needs. Service will only be billed when office clinical staff spend 20 minutes or more in a month to coordinate care. Only one practitioner may furnish and bill the service in a calendar month. The patient may stop CCM services at amy time (effective at the end of the month) by phone call to the office staff. The patient will be responsible for cost sharing (co-pay) or up to 20% of the service fee (after annual deductible is met)  Patient agreed to services and verbal consent obtained.  Follow up plan:   Face to Face appointment with care management team member scheduled for: 05/05/2021 $RemoveBeforeD'@930am'[email or web address]$   [place]

## 2021-03-13 NOTE — Assessment & Plan Note (Addendum)
Starting [facility], advised on use and side effects. Advised of CDC guidelines for self isolation/ ending isolation.  Advised of safe practice guidelines. Symptom Tier reviewed.  Encouraged to monitor for any worsening symptoms; watch for increased shortness of breath, weakness, and signs of dehydration. Advised when to seek emergency care.  Instructed to rest and hydrate well.  Advised to leave the house during recommended isolation period, only if it is necessary to seek medical care.

## 2021-03-13 NOTE — Progress Notes (Signed)
MyChart Video Visit    Virtual Visit via Video Note   This visit type was conducted due to national recommendations for restrictions regarding the COVID-19 Pandemic (e.g. social distancing) in an effort to limit this patient's exposure and mitigate transmission in our community. This patient is at least at moderate risk for complications without adequate follow up. This format is felt to be most appropriate for this patient at this time. Physical exam was limited by quality of the video and audio technology used for the visit. CMA was able to get the patient set up on a video visit.  Patient location: Home. Patient and provider in visit Provider location: Office  I discussed the limitations of evaluation and management by telemedicine and the availability of in person appointments. The patient expressed understanding and agreed to proceed.  Visit Date: 03/13/2021  Today's healthcare provider: Jeanie Sewer, NP     Subjective:    Patient ID: Caleb Gibson, male    DOB: [birth year], 80 y.o.   MRN: [id number]  Chief Complaint  Patient presents with   Covid Positive    Symptoms started last night. Test was taken 15 min ago.   Fatigue   Fever    100.4   Cough   Sore Throat    Fever  Associated symptoms include coughing.  Cough Associated symptoms include a fever.  Sore Throat  Associated symptoms include coughing.  Reports severe sore throat starting overnight, cough and mild fever, sore throat this morning, feeling more tired last 2 days, tested positive this am for Covid19, has had no vaccines.     Outpatient Medications Prior to Visit  Medication Sig Dispense Refill   CALCIUM CITRATE PO Take 600 mg by mouth daily.      cholecalciferol (VITAMIN D) 1000 units tablet Take 1,000 Units by mouth daily.      donepezil (ARICEPT) 10 MG tablet Take 1 tablet (10 mg total) by mouth daily. 90 tablet 4   memantine (NAMENDA) 10 MG tablet Take 1 tablet (10 mg total) by mouth 2  (two) times daily. 180 tablet 4   Multiple Vitamins-Minerals (CENTRUM SILVER) tablet Take 1 tablet by mouth daily.      Multiple Vitamins-Minerals (EYE VITAMINS PO)      Omega-3 Fatty Acids (FISH OIL) 1200 MG CAPS Take by mouth.     QUEtiapine (SEROQUEL) 25 MG tablet Take 25 mg by mouth 2 (two) times daily.     rosuvastatin (CRESTOR) 5 MG tablet TAKE 1/2 TABLET EVERY DAY 45 tablet 3   ALPRAZolam (XANAX) 0.5 MG tablet Take 1 tablet (0.5 mg total) by mouth daily as needed for anxiety. (Patient not taking: Reported on 03/13/2021) 30 tablet 0   Amino Acids (L-CARNITINE PO) Take 250 mg by mouth daily.  (Patient not taking: Reported on 03/13/2021)     Fluticasone Propionate (FLONASE NA) Place into the nose. (Patient not taking: Reported on 03/13/2021)     glucosamine-chondroitin 500-400 MG tablet Take 1 tablet by mouth daily.  (Patient not taking: Reported on 03/13/2021)     Lycopene 10 MG CAPS Take 1 capsule by mouth daily.  (Patient not taking: Reported on 03/13/2021)     tamsulosin (FLOMAX) 0.4 MG CAPS capsule Take 0.4 mg by mouth. (Patient not taking: Reported on 03/13/2021)     vitamin C (ASCORBIC ACID) 500 MG tablet Take 500 mg by mouth daily.  (Patient not taking: Reported on 03/13/2021)     No facility-administered medications prior to visit.  Allergies  Allergen Reactions   Viagra [Sildenafil Citrate] Other (See Comments)    Hallucinations     Review of Systems  Constitutional:  Positive for fever.  Respiratory:  Positive for cough.   See HPI above.     Objective:    Physical Exam Vitals and nursing note reviewed.  Constitutional:      General: He is not in acute distress. HENT:     Head: Normocephalic.  Pulmonary:     Effort: Pulmonary effort is normal. No respiratory distress.  Neurological:     Mental Status: He is alert and oriented to person, place, and time.  Psychiatric:        Mood and Affect: Mood normal.        Behavior: Behavior normal.    Temp (!) 100.4 F (38  C) Comment: Reported by pt.  Ht 5\' 10"  (1.778 m)   Wt 215 lb 9.8 oz (97.8 kg)   BMI 30.94 kg/m  Wt Readings from Last 3 Encounters:  03/13/21 215 lb 9.8 oz (97.8 kg)  11/12/20 215 lb 9.6 oz (97.8 kg)  10/21/20 215 lb 6.4 oz (97.7 kg)       Assessment & Plan:   Problem List Items Addressed This Visit       Other   COVID-19    Advised of CDC guidelines for self isolation/ ending isolation.  Advised of safe practice guidelines. Symptom Tier reviewed.  Encouraged to monitor for any worsening symptoms; watch for increased shortness of breath, weakness, and signs of dehydration. Advised when to seek emergency care.  Instructed to rest and hydrate well.  Advised to leave the house during recommended isolation period, only if it is necessary to seek medical care       Relevant Medications   molnupiravir EUA (LAGEVRIO) 200 mg CAPS capsule    I am having Caleb Gibson start on molnupiravir EUA. I am also having him maintain his CALCIUM CITRATE PO, Amino Acids (L-CARNITINE PO), vitamin C, Centrum Silver, Lycopene, glucosamine-chondroitin, Multiple Vitamins-Minerals (EYE VITAMINS PO), cholecalciferol, Fish Oil, Fluticasone Propionate (FLONASE NA), memantine, donepezil, tamsulosin, ALPRAZolam, QUEtiapine, and rosuvastatin.  Meds ordered this encounter  Medications   molnupiravir EUA (LAGEVRIO) 200 mg CAPS capsule    Sig: Take 4 capsules (800 mg total) by mouth 2 (two) times daily for 5 days.    Dispense:  40 capsule    Refill:  0    Order Specific Question:   Supervising Provider    Answer:   ANDY, CAMILLE L [[id number]]    I discussed the assessment and treatment plan with the patient. The patient was provided an opportunity to ask questions and all were answered. The patient agreed with the plan and demonstrated an understanding of the instructions.   The patient was advised to call back or seek an in-person evaluation if the symptoms worsen or if the condition fails to improve as  anticipated.  I provided 30 minutes of face-to-face time during this encounter.   Jeanie Sewer, NP [place] [phone] (phone) [phone] (fax)  [place]

## 2021-03-27 DIAGNOSIS — F419 Anxiety disorder, unspecified: Secondary | ICD-10-CM | POA: Diagnosis not present

## 2021-03-27 DIAGNOSIS — G3184 Mild cognitive impairment, so stated: Secondary | ICD-10-CM | POA: Diagnosis not present

## 2021-04-03 DIAGNOSIS — F419 Anxiety disorder, unspecified: Secondary | ICD-10-CM | POA: Diagnosis not present

## 2021-04-03 DIAGNOSIS — G3184 Mild cognitive impairment, so stated: Secondary | ICD-10-CM | POA: Diagnosis not present

## 2021-04-04 ENCOUNTER — Other Ambulatory Visit: Payer: Self-pay

## 2021-04-04 ENCOUNTER — Encounter: Payer: Self-pay | Admitting: Family Medicine

## 2021-04-04 ENCOUNTER — Ambulatory Visit (INDEPENDENT_AMBULATORY_CARE_PROVIDER_SITE_OTHER): Payer: Medicare HMO | Admitting: Family Medicine

## 2021-04-04 VITALS — BP 100/66 | HR 75 | Temp 98.0°F | Ht 70.0 in | Wt 211.4 lb

## 2021-04-04 DIAGNOSIS — R519 Headache, unspecified: Secondary | ICD-10-CM | POA: Diagnosis not present

## 2021-04-04 DIAGNOSIS — G3184 Mild cognitive impairment, so stated: Secondary | ICD-10-CM | POA: Diagnosis not present

## 2021-04-04 DIAGNOSIS — K5900 Constipation, unspecified: Secondary | ICD-10-CM | POA: Diagnosis not present

## 2021-04-04 DIAGNOSIS — R1013 Epigastric pain: Secondary | ICD-10-CM

## 2021-04-04 DIAGNOSIS — F419 Anxiety disorder, unspecified: Secondary | ICD-10-CM

## 2021-04-04 LAB — COMPREHENSIVE METABOLIC PANEL
ALT: 12 U/L (ref 0–53)
AST: 14 U/L (ref 0–37)
Albumin: 4.3 g/dL (ref 3.5–5.2)
Alkaline Phosphatase: 71 U/L (ref 39–117)
BUN: 14 mg/dL (ref 6–23)
CO2: 29 mEq/L (ref 19–32)
Calcium: 9.1 mg/dL (ref 8.4–10.5)
Chloride: 105 mEq/L (ref 96–112)
Creatinine, Ser: 0.94 mg/dL (ref 0.40–1.50)
GFR: 76.31 mL/min (ref >60.00)
Glucose, Bld: 102 mg/dL — ABNORMAL HIGH (ref 70–99)
Potassium: 4.2 mEq/L (ref 3.5–5.1)
Sodium: 140 mEq/L (ref 135–145)
Total Bilirubin: 0.5 mg/dL (ref 0.2–1.2)
Total Protein: 6.4 g/dL (ref 6.0–8.3)

## 2021-04-04 LAB — B12 AND FOLATE PANEL
Folate: 18.5 ng/mL (ref >5.9)
Vitamin B-12: 450 pg/mL (ref 211–911)

## 2021-04-04 LAB — CBC WITH DIFFERENTIAL/PLATELET
Basophils Absolute: 0 10*3/uL (ref 0.0–0.1)
Basophils Relative: 0.5 % (ref 0.0–3.0)
Eosinophils Absolute: 0 10*3/uL (ref 0.0–0.7)
Eosinophils Relative: 1 % (ref 0.0–5.0)
HCT: 38.6 % — ABNORMAL LOW (ref 39.0–52.0)
Hemoglobin: 13.1 g/dL (ref 13.0–17.0)
Lymphocytes Relative: 11.6 % — ABNORMAL LOW (ref 12.0–46.0)
Lymphs Abs: 0.5 10*3/uL — ABNORMAL LOW (ref 0.7–4.0)
MCHC: 33.8 g/dL (ref 30.0–36.0)
MCV: 93.1 fl (ref 78.0–100.0)
Monocytes Absolute: 0.3 10*3/uL (ref 0.1–1.0)
Monocytes Relative: 8 % (ref 3.0–12.0)
Neutro Abs: 3.3 10*3/uL (ref 1.4–7.7)
Neutrophils Relative %: 78.9 % — ABNORMAL HIGH (ref 43.0–77.0)
Platelets: 208 10*3/uL (ref 150.0–400.0)
RBC: 4.15 Mil/uL — ABNORMAL LOW (ref 4.22–5.81)
RDW: 13.5 % (ref 11.5–15.5)
WBC: 4.1 10*3/uL (ref 4.0–10.5)

## 2021-04-04 MED ORDER — OMEPRAZOLE 20 MG PO CPDR: 20.0000 mg | DELAYED_RELEASE_CAPSULE | Freq: Every day | ORAL | 3 refills | Status: DC

## 2021-04-04 NOTE — Patient Instructions (Signed)
Please follow up if symptoms do not improve or as needed.    Treat your headaches: tylenol or advil.  Start omeprazole once daily to help with upper abdominal pain.  Start colace twice a day and miralax once a day to treat the constipation.   Retry the risperidone.   I will release your lab results to you on your MyChart account with further instructions. Please reply with any questions.

## 2021-04-04 NOTE — Progress Notes (Signed)
Subjective  CC:  Chief Complaint  Patient presents with   Headache   Abdominal Pain    Stomach ache, on going for a few weeks    HPI: Caleb Gibson is a 80 y.o. male who presents to the office today to address the problems listed above in the chief complaint. 80 year old male with neurocognitive disorder and anxiety currently seeing neurology and psychiatry presents for symptoms of occipital headaches and midepigastric pain with constipation over the last several weeks.  He is here with his wife.  They are both frustrated.  Psychiatry has been working with his medications to help his anxiety.  Recently stopped Seroquel to change to risperidone.  He reports he only took 1 dose of that and woke up with headache and abdominal pain.  He has not tried further doses.  His anxiety is active.  He complains of a dull intermittent occipital headache without nausea, photophobia or neurologic symptoms.  He has taken Advil once or twice and that does help with the symptoms.  He also complains of upper abdominal aching.  He reports he is constipated.  He denies bloating or focal sharp abdominal pain.  His appetite is fair.  He continues to struggle with anxiety and panic symptoms. Assessment  1. Occipital headache   2. Midepigastric pain   3. Constipation, unspecified constipation type   4. Mild neurocognitive disorder   5. Anxiety      Plan  Constellation of symptoms could be related to ongoing anxiety and worry resulting in headache: Reassured.  Treat headache with Tylenol and or Advil. Anxiety and neurocognitive disorder: I recommend trying risperidone again.  I do not believe he had a reaction to this.  Continue to work with psychiatry for medication adjustment.  Discussed the relationship between anxiety worry and his somatic headache. Treat constipation with stool softener and MiraLAX.  Start omeprazole for epigastric pain, could be related to stress gastritis. Check lab work  Follow up: As  scheduled 05/05/2021  Orders Placed This Encounter  Procedures   Urine Culture   B12 and Folate Panel   CBC with Differential/Platelet   Comprehensive metabolic panel    Meds ordered this encounter  Medications   omeprazole (PRILOSEC) 20 MG capsule    Sig: Take 1 capsule (20 mg total) by mouth daily.    Dispense:  30 capsule    Refill:  3       I reviewed the patients updated PMH, FH, and SocHx.    Patient Active Problem List   Diagnosis Date Noted   Mild neurocognitive disorder 05/10/2018    Priority: 1.   History of prostate cancer 11/22/2017    Priority: 1.   Tubular adenoma of colon 06/15/2017    Priority: 1.   On statin therapy due to risk of future cardiovascular event 10/08/2015    Priority: 1.   Mixed hyperlipidemia 09/27/2014    Priority: 1.   History of traumatic head injury     Priority: 2.   Idiopathic hypotension 01/07/2016    Priority: 2.   BPH (benign prostatic hyperplasia) 10/01/2011    Priority: 2.   Erectile dysfunction 10/01/2011    Priority: 2.   Osteoarthritis, knee 08/01/2010    Priority: 2.   Claustrophobia 07/08/2009    Priority: 2.   Basal cell carcinoma of skin 06/15/2017    Priority: 3.   Simple cyst of kidney 11/12/2011    Priority: 3.   Allergic rhinitis 08/27/2011    Priority: 3.  COVID-19 03/13/2021   REM behavioral disorder    Current Meds  Medication Sig   ALPRAZolam (XANAX) 0.5 MG tablet Take 1 tablet (0.5 mg total) by mouth daily as needed for anxiety.   cholecalciferol (VITAMIN D) 1000 units tablet Take 1,000 Units by mouth daily.    donepezil (ARICEPT) 10 MG tablet Take 1 tablet (10 mg total) by mouth daily.   Fluticasone Propionate (FLONASE NA) Place into the nose.   memantine (NAMENDA) 10 MG tablet Take 1 tablet (10 mg total) by mouth 2 (two) times daily.   Multiple Vitamins-Minerals (CENTRUM SILVER) tablet Take 1 tablet by mouth daily.    Omega-3 Fatty Acids (FISH OIL) 1200 MG CAPS Take by mouth.   omeprazole  (PRILOSEC) 20 MG capsule Take 1 capsule (20 mg total) by mouth daily.   risperiDONE (RISPERDAL) 0.25 MG tablet Take 0.25 mg by mouth in the morning and at bedtime. Take 2 as needed daily for anxiety   rosuvastatin (CRESTOR) 5 MG tablet TAKE 1/2 TABLET EVERY DAY   tamsulosin (FLOMAX) 0.4 MG CAPS capsule Take 0.4 mg by mouth.    Allergies: Patient is allergic to viagra [sildenafil citrate]. Family History: Patient family history includes Alcohol abuse in his maternal aunt and mother; Cirrhosis in his maternal aunt; Diabetes in his father; Glaucoma in his father; Heart disease in his maternal grandmother; Lymphoma in his mother; Melanoma in his mother; Mental illness in his mother; Prostate cancer in his father. Social History:  Patient  reports that he quit smoking about 26 years ago. His smoking use included cigarettes. He has never used smokeless tobacco. He reports current alcohol use of about 10.0 standard drinks per week. He reports that he does not use drugs.  Review of Systems: Constitutional: Negative for fever malaise or anorexia Cardiovascular: negative for chest pain Respiratory: negative for SOB or persistent cough Gastrointestinal: negative for abdominal pain  Objective  Vitals: BP 100/66   Pulse 75   Temp 98 F (36.7 C) (Temporal)   Ht 5\' 10"  (1.778 m)   Wt 211 lb 6.4 oz (95.9 kg)   SpO2 98%   BMI 30.33 kg/m  General: no acute distress , A&Ox3 Psych: Flat affect, poor insight HEENT: PEERL, conjunctiva normal, neck is supple Cardiovascular:  RRR without murmur or gallop.  Respiratory:  Good breath sounds bilaterally, CTAB with normal respiratory effort Gastrointestinal: soft, flat abdomen, normal active bowel sounds, no palpable masses, no hepatosplenomegaly, no appreciated hernias Nontender Skin:  Warm, no rashes Nonfocal neuro    Commons side effects, risks, benefits, and alternatives for medications and treatment plan prescribed today were discussed, and the  patient expressed understanding of the given instructions. Patient is instructed to call or message via MyChart if he/she has any questions or concerns regarding our treatment plan. No barriers to understanding were identified. We discussed Red Flag symptoms and signs in detail. Patient expressed understanding regarding what to do in case of urgent or emergency type symptoms.  Medication list was reconciled, printed and provided to the patient in AVS. Patient instructions and summary information was reviewed with the patient as documented in the AVS. This note was prepared with assistance of Dragon voice recognition software. Occasional wrong-word or sound-a-like substitutions may have occurred due to the inherent limitations of voice recognition software  This visit occurred during the SARS-CoV-2 public health emergency.  Safety protocols were in place, including screening questions prior to the visit, additional usage of staff PPE, and extensive cleaning of exam room while observing  appropriate contact time as indicated for disinfecting solutions.

## 2021-04-05 LAB — URINE CULTURE
MICRO NUMBER:: 12565614
Result:: NO GROWTH
SPECIMEN QUALITY:: ADEQUATE

## 2021-04-07 ENCOUNTER — Telehealth: Payer: Self-pay

## 2021-04-07 NOTE — Telephone Encounter (Signed)
Patient calling back about lab results.  °

## 2021-04-07 NOTE — Telephone Encounter (Signed)
Spoke with patients wife, gave a verbal understanding

## 2021-04-07 NOTE — Progress Notes (Signed)
Please call patient: I have reviewed his/her lab results. All lab results are fine. B12 and folate levels are now in the normal range as expected. There is not urine infection, blood counts are normal.

## 2021-04-08 ENCOUNTER — Ambulatory Visit: Payer: Medicare HMO | Admitting: Neurology

## 2021-04-09 ENCOUNTER — Ambulatory Visit: Payer: Medicare HMO | Admitting: Neurology

## 2021-04-10 DIAGNOSIS — F419 Anxiety disorder, unspecified: Secondary | ICD-10-CM | POA: Diagnosis not present

## 2021-04-10 DIAGNOSIS — G3184 Mild cognitive impairment, so stated: Secondary | ICD-10-CM | POA: Diagnosis not present

## 2021-04-16 ENCOUNTER — Telehealth: Payer: Self-pay | Admitting: Family Medicine

## 2021-04-16 NOTE — Progress Notes (Signed)
  Care Management  Note   04/16/2021 Name: Caleb Gibson MRN: [id number] DOB: 09/19/1940  Lyndle Pang is a 80 y.o. year old male who is a primary care patient of [name], MD. The care management team was consulted for assistance with chronic disease management and care coordination needs.   Mr. Kautzman was given information about Care Management services today including:  CCM service includes personalized support from designated clinical staff supervised by the physician, including individualized plan of care and coordination with other care providers 24/7 contact phone numbers for assistance for urgent and routine care needs. Service will only be billed when office clinical staff spend 20 minutes or more in a month to coordinate care. Only one practitioner may furnish and bill the service in a calendar month. The patient may stop CCM services at amy time (effective at the end of the month) by phone call to the office staff. The patient will be responsible for cost sharing (co-pay) or up to 20% of the service fee (after annual deductible is met)  Patient agreed to services and verbal consent obtained.  Follow up plan:   Face to Face appointment with care management team member scheduled for: 05/05/21 _0   Noelle Penner [facility] Scheduler

## 2021-04-17 DIAGNOSIS — F419 Anxiety disorder, unspecified: Secondary | ICD-10-CM | POA: Diagnosis not present

## 2021-04-17 DIAGNOSIS — G3184 Mild cognitive impairment, so stated: Secondary | ICD-10-CM | POA: Diagnosis not present

## 2021-04-23 ENCOUNTER — Ambulatory Visit: Payer: Medicare HMO | Admitting: Neurology

## 2021-04-23 NOTE — Progress Notes (Signed)
Chronic Care Management Pharmacy Note  05/05/2021 Name:  Caleb Gibson MRN:  [id number] DOB:  [birth year]  Summary: Initial visit with PharmD.  Patient has been doing the best in a while and had a really good past week mentally.  Did have episode of confusion/dizziness this AM but was able to drive to appt.  Reviewed all meds.  No longer taking Prilosec, only adverse effect is cloudiness from Risperdal.  Does mention some dizziness and low BP but no logs today.  Recommendations/Changes made from today's visit: Check BP when dizzy Consider trial of Flomax at bedtime  Plan: FU 6 months   Subjective: Caleb Gibson is an 80 y.o. year old male who is a primary patient of [name], MD.  The CCM team was consulted for assistance with disease management and care coordination needs.    Engaged with patient face to face for initial visit in response to provider referral for pharmacy case management and/or care coordination services.   Consent to Services:  The patient was given the following information about Chronic Care Management services today, agreed to services, and gave verbal consent: 1. CCM service includes personalized support from designated clinical staff supervised by the primary care provider, including individualized plan of care and coordination with other care providers 2. 24/7 contact phone numbers for assistance for urgent and routine care needs. 3. Service will only be billed when office clinical staff spend 20 minutes or more in a month to coordinate care. 4. Only one practitioner may furnish and bill the service in a calendar month. 5.The patient may stop CCM services at any time (effective at the end of the month) by phone call to the office staff. 6. The patient will be responsible for cost sharing (co-pay) of up to 20% of the service fee (after annual deductible is met). Patient agreed to services and consent obtained.  Patient Care Team: [name], MD as PCP -  General (Family Medicine) Ceasar Mons, MD as Consulting Physician (Urology) Manson Passey, [facility] (Specialist) Danella Sensing, MD as Consulting Physician (Dermatology) Ronnald Ramp (Dentistry) Marcial Pacas, MD as Consulting Physician (Neurology) Susette Racer, MD as Consulting Physician (Endocrinology) Edythe Clarity, [facility] as Pharmacist (Pharmacist)  Recent office visits:  04/04/2021 [facility] (PCP) [name], MD;  I recommend trying risperidone again. Treat constipation with stool softener and MiraLAX.  Start omeprazole for epigastric pain.   03/13/2021 VV Jeanie Sewer, NP; start on molnupiravir EUA. I am also having him maintain his CALCIUM CITRATE PO, Amino Acids (L-CARNITINE PO), vitamin C, Centrum Silver, Lycopene, glucosamine-chondroitin, Multiple Vitamins-Minerals (EYE VITAMINS PO), cholecalciferol, Fish Oil, Fluticasone Propionate (FLONASE NA), memantine, donepezil, tamsulosin, ALPRAZolam, QUEtiapine, and rosuvastatin.   11/12/2020 [facility] (PCP) [name], MD; continue current medications, no medication changes indicated.   Recent consult visits:  02/24/2021 [facility] (psychiatry) Loletta Specter; no further information available.   02/03/2021 [facility] (urology) Winter. Conception Oms; no further information available.   01/27/2021 [facility] (ophthalmology) Warden Fillers; no further information available.   01/24/2021 [facility] (psychiatry) Loletta Specter; no further information available.   01/02/2021 [facility] (dermatology) Griselda Miner; no further information available.   12/27/2020 [facility] (neurology) Hazle Coca, PhD; no medication changes indicated.   12/24/2020 [facility] (dermatology) Griselda Miner; no further information available.   12/19/2020 [facility] (neurology) Hazle Coca, PhD; no medication changes indicated.   12/03/2020 [facility] (dermatology) Danella Sensing; no further information available.   11/29/2020 [facility] (psychiatry) Ricard Dillon; no further information available.   Hospital  visits:  None  in previous 6 months     Objective:  Lab Results  Component Value Date   CREATININE 0.94 04/04/2021   BUN 14 04/04/2021   GFR 76.31 04/04/2021   GFRNONAA 75 05/15/2020   GFRAA 87 05/15/2020   NA 140 04/04/2021   K 4.2 04/04/2021   CALCIUM 9.1 04/04/2021   CO2 29 04/04/2021   GLUCOSE 102 (H) 04/04/2021    Lab Results  Component Value Date/Time   GFR 76.31 04/04/2021 11:43 AM   GFR 71.94 10/21/2020 09:45 AM    Last diabetic Eye exam: No results found for: HMDIABEYEEXA  Last diabetic Foot exam: No results found for: HMDIABFOOTEX   Lab Results  Component Value Date   CHOL 132 07/16/2020   HDL 67.60 07/16/2020   LDLCALC 56 07/16/2020   TRIG 43.0 07/16/2020   CHOLHDL 2 07/16/2020    Hepatic Function Latest Ref Rng & Units 04/04/2021 10/21/2020 07/16/2020  Total Protein 6.0 - 8.3 g/dL 6.4 6.2 6.4  Albumin 3.5 - 5.2 g/dL 4.3 4.5 4.3  AST 0 - 37 U/L 14 11 14   ALT 0 - 53 U/L 12 11 14   Alk Phosphatase 39 - 117 U/L 71 60 63  Total Bilirubin 0.2 - 1.2 mg/dL 0.5 0.7 0.5    Lab Results  Component Value Date/Time   TSH 1.12 10/21/2020 09:45 AM   TSH 1.10 05/15/2020 02:39 PM    CBC Latest Ref Rng & Units 04/04/2021 10/21/2020 07/16/2020  WBC 4.0 - 10.5 K/uL 4.1 3.7(L) 3.7(L)  Hemoglobin 13.0 - 17.0 g/dL 13.1 13.1 12.9(L)  Hematocrit 39.0 - 52.0 % 38.6(L) 37.9(L) 37.5(L)  Platelets 150.0 - 400.0 K/uL 208.0 188.0 183.0    Lab Results  Component Value Date/Time   VD25OH 58.22 09/29/2019 10:33 AM    Clinical ASCVD: No  The ASCVD Risk score (Arnett DK, et al., 2019) failed to calculate for the following reasons:   The 2019 ASCVD risk score is only valid for ages 99 to 68    Depression screen PHQ 2/9 07/16/2020 11/20/2019 10/05/2019  Decreased Interest 0 0 0  Down, Depressed, Hopeless 0 0 0  PHQ - 2 Score 0 0 0  Altered sleeping - - -  Tired, decreased energy - - -  Change in appetite - - -  Feeling bad or failure about yourself  - - -  Trouble concentrating  - - -  Moving slowly or fidgety/restless - - -  Suicidal thoughts - - -  PHQ-9 Score - - -  Difficult doing work/chores - - -      Social History   Tobacco Use  Smoking Status Former   Types: Cigarettes   Quit date: 1996   Years since quitting: 26.9  Smokeless Tobacco Never   BP Readings from Last 3 Encounters:  04/04/21 100/66  11/12/20 121/77  10/21/20 120/68   Pulse Readings from Last 3 Encounters:  04/04/21 75  11/12/20 70  10/21/20 65   Wt Readings from Last 3 Encounters:  04/04/21 211 lb 6.4 oz (95.9 kg)  03/13/21 215 lb 9.8 oz (97.8 kg)  11/12/20 215 lb 9.6 oz (97.8 kg)   BMI Readings from Last 3 Encounters:  04/04/21 30.33 kg/m  03/13/21 30.94 kg/m  11/12/20 30.94 kg/m    Assessment/Interventions: Review of patient past medical history, allergies, medications, health status, including review of consultants reports, laboratory and other test data, was performed as part of comprehensive evaluation and provision of chronic care management services.   SDOH:  (Social Determinants  of Health) assessments and interventions performed: Yes  Financial Resource Strain: Low Risk    Difficulty of Paying Living Expenses: Not very hard    SDOH Screenings   Alcohol Screen: Not on file  Depression (PHQ2-9): Low Risk    PHQ-2 Score: 0  Financial Resource Strain: Low Risk    Difficulty of Paying Living Expenses: Not very hard  Food Insecurity: Not on file  Housing: Not on file  Physical Activity: Not on file  Social Connections: Not on file  Stress: Not on file  Tobacco Use: Medium Risk   Smoking Tobacco Use: Former   Smokeless Tobacco Use: Never   Passive Exposure: Not on file  Transportation Needs: Not on file    [place]  Allergies  Allergen Reactions   Viagra [Sildenafil Citrate] Other (See Comments)    Hallucinations     Medications Reviewed Today     Reviewed by Edythe Clarity, [facility] (Pharmacist) on 05/05/21 at [place] List Status:  <None>   Medication Order Taking? Sig Documenting Provider Last Dose Status Informant  ALPRAZolam (XANAX) 0.5 MG tablet [id number] Yes Take 1 tablet (0.5 mg total) by mouth daily as needed for anxiety. [name], MD Taking Active   cholecalciferol (VITAMIN D) 1000 units tablet [id number] Yes Take 1,000 Units by mouth daily.  [provider] Taking Active   donepezil (ARICEPT) 10 MG tablet [id number] Yes Take 1 tablet (10 mg total) by mouth daily. Marcial Pacas, MD Taking Active   Fluticasone Propionate [facility] NA) [id number] Yes Place into the nose. [provider] Taking Active   memantine (NAMENDA) 10 MG tablet [id number] Yes Take 1 tablet (10 mg total) by mouth 2 (two) times daily. Marcial Pacas, MD Taking Active   Multiple Vitamins-Minerals (CENTRUM SILVER) tablet [id number] Yes Take 1 tablet by mouth daily.  [provider] Taking Active   Omega-3 Fatty Acids (FISH OIL) 1200 MG CAPS [id number] Yes Take by mouth. [provider] Taking Active   omeprazole (PRILOSEC) 20 MG capsule [id number] No Take 1 capsule (20 mg total) by mouth daily.  Patient not taking: Reported on 05/05/2021   [name], MD Not Taking Active   risperiDONE (RISPERDAL) 0.25 MG tablet [id number] Yes Take 0.25 mg by mouth in the morning and at bedtime. Take 2 as needed daily for anxiety [provider] Taking Active   rosuvastatin (CRESTOR) 5 MG tablet [id number] Yes TAKE 1/2 TABLET EVERY DAY [name], MD Taking Active   tamsulosin [facility]) 0.4 MG CAPS capsule [id number] Yes Take 0.4 mg by mouth. [provider] Taking Active             Patient Active Problem List   Diagnosis Date Noted   COVID-19 03/13/2021   REM behavioral disorder    History of traumatic head injury    Mild neurocognitive disorder 05/10/2018   History of prostate cancer 11/22/2017   Basal cell carcinoma of skin 06/15/2017   Tubular adenoma of colon 06/15/2017   Idiopathic hypotension  01/07/2016   On statin therapy due to risk of future cardiovascular event 10/08/2015   Mixed hyperlipidemia 09/27/2014   Simple cyst of kidney 11/12/2011   BPH (benign prostatic hyperplasia) 10/01/2011   Erectile dysfunction 10/01/2011   Allergic rhinitis 08/27/2011   Osteoarthritis, knee 08/01/2010   Claustrophobia 07/08/2009    Immunization History  Administered Date(s) Administered   Influenza Split 03/14/2011   Influenza, High Dose Seasonal PF 02/27/2012, 02/05/2014, 03/10/2015, 04/14/2016, 03/02/2017, 02/23/2018, 01/26/2019, 01/26/2019  Influenza, Seasonal, Injecte, Preservative Fre 02/27/2013   Influenza-Unspecified 04/15/2017, 03/08/2020   Pneumococcal Conjugate-13 02/05/2014   Pneumococcal-Unspecified 07/08/2009   Td 11/15/2007   Tdap 10/01/2011   Zoster Recombinat (Shingrix) 02/05/2018, 04/18/2018   Zoster, Live 06/20/2010    Conditions to be addressed/monitored:  Hypotension, Allergic rhinitis, HLD, Mild neurocognitive disorder  Care Plan : General Pharmacy (Adult)  Updates made by Edythe Clarity, RPH since 05/05/2021 12:00 AM     Problem: Hypotension, Allergic rhinitis, HLD, Mild neurocognitive disorder   Priority: High  Onset Date: 05/05/2021     Long-Range Goal: Patient-Specific Goal   Start Date: 05/05/2021  Expected End Date: 11/02/2021  This Visit's Progress: On track  Priority: High  Note:   Current Barriers:  Adverse effects with Risperdal  Pharmacist Clinical Goal(s):  Patient will achieve improvement in symptoms of cloudiness as evidenced by symptom level through collaboration with PharmD and provider.   Interventions: 1:1 collaboration with [name], MD regarding development and update of comprehensive plan of care as evidenced by provider attestation and co-signature Inter-disciplinary care team collaboration (see longitudinal plan of care) Comprehensive medication review performed; medication list updated in electronic medical  record  Hypotension (BP goal Prevent Symptoms) -Controlled -Current treatment: None noted -Medications previously tried: none noted  -Current home readings: logs not provided today, reports he does "run low." Occasionally really low number but has not checked within last week or so. -Current dietary habits: eats relatively healthy diet, chicken and veggies, minimal sweets, occasional beer in the evening -Current exercise habits: walks daily with his wife -Reports hypotensive/hypertensive symptoms - some dizziness/cloudiness recently, this morning had an episode -Educated on Importance of home blood pressure monitoring; Symptoms of hypotension and importance of maintaining adequate hydration; -Counseled to monitor BP at home when symptomatic, document, and provide log at future appointments -Recommended continue current management Check BP when he feels dizzy, careful when switching from sitting to standing  Hyperlipidemia: (LDL goal < 100) -Controlled -Current treatment: Rosuvastatin 97m one-half tablet daily -Medications previously tried: none noted  -Current dietary patterns: see HTN -Current exercise habits: see HTN -Educated on Cholesterol goals;  Benefits of statin for ASCVD risk reduction; Importance of limiting foods high in cholesterol; Most recent LDL is excellent, no adverse effects from statin -Recommended to continue current medication  Allergic Rhinitis (Goal: Minimize symptoms) -Controlled -Current treatment  Fluticasone 579m prn -Medications previously tried: none noted -Uses prn  -Recommended to continue current medication  Neurocognitive disorder (Goal: Minimize symptoms) -Controlled -Current treatment  Donepezil 1051maily Memantine 48m89mily Risperidone 0.25mg58mce daily -Medications previously tried: citalopram, seroquel  -He does experience some "cloudiness" and dizziness occasionally.  Wife states that his anxiety and mental state have been the  best in a while since he started the risperidone.  Counseled on adverse effects subsiding over time.  Did have episode of grogginess today but was still able to drive to appointment.   -Recommended to continue current medication If cloudiness does not resolve over time contact providers.  Do not drive when experiencing this feeling.  Patient Goals/Self-Care Activities Patient will:  - take medications as prescribed as evidenced by patient report and record review focus on medication adherence by pill counts check blood pressure periodically when symptomatic, document, and provide at future appointments  Follow Up Plan: The care management team will reach out to the patient again over the next 180 days.         Medication Assistance: None required.  Patient affirms current coverage  meets needs.  Compliance/Adherence/Medication fill history: Care Gaps: Pneumonia Vaccine  Star-Rating Drugs: Rosuvastatin 5 mg last filled 02/01/2021 90 DS  Patient's preferred pharmacy is:  [facility] [address], [place] - [place] [address] [place] [address] Phone: [phone] Fax: [phone]  [facility] - [place], [place] [address] Phone: [phone] Fax: [phone]  [id number] [place], [place] [id number]: [phone]: [phone] Uses pill box? No - cups organized by wife Pt endorses 100% compliance  We discussed: Benefits of medication synchronization, packaging and delivery as well as enhanced pharmacist oversight with [facility]. Patient decided to: Continue current medication management strategy  Care Plan and Follow Up Patient Decision:  Patient agrees to Care Plan and Follow-up.  Plan: The care management team will reach out to the patient again over the next 180 days.  CBeverly Milch  PharmD Clinical Pharmacist  [phone]

## 2021-04-24 DIAGNOSIS — G3184 Mild cognitive impairment, so stated: Secondary | ICD-10-CM | POA: Diagnosis not present

## 2021-04-24 DIAGNOSIS — F419 Anxiety disorder, unspecified: Secondary | ICD-10-CM | POA: Diagnosis not present

## 2021-04-28 ENCOUNTER — Telehealth: Payer: Self-pay | Admitting: Pharmacist

## 2021-04-28 NOTE — Chronic Care Management (AMB) (Signed)
Chronic Care Management Pharmacy Assistant   Name: Caleb Gibson  MRN: [id number] DOB: 08-Sep-1940   Reason for Encounter: Chart Review For Initial Visit With Clinical Pharmacist   Conditions to be addressed/monitored: Idiopathic Hypotension, OA, BPH, HLD, Mild Cognitive Disorder, History of traumatic head injury  Primary concerns for visit include: Hypotension, HLD, Mild Cognitive Disorder  Recent office visits:  04/04/2021 OV (PCP) [name], MD;  I recommend trying risperidone again. Treat constipation with stool softener and MiraLAX.  Start omeprazole for epigastric pain.  03/13/2021 VV Jeanie Sewer, NP; start on molnupiravir EUA. I am also having him maintain his CALCIUM CITRATE PO, Amino Acids (L-CARNITINE PO), vitamin C, Centrum Silver, Lycopene, glucosamine-chondroitin, Multiple Vitamins-Minerals (EYE VITAMINS PO), cholecalciferol, Fish Oil, Fluticasone Propionate (FLONASE NA), memantine, donepezil, tamsulosin, ALPRAZolam, QUEtiapine, and rosuvastatin.  11/12/2020 OV (PCP) [name], MD; continue current medications, no medication changes indicated.  Recent consult visits:  02/24/2021 OV (psychiatry) Loletta Specter; no further information available.  02/03/2021 OV (urology) Winter. Conception Oms; no further information available.  01/27/2021 OV (ophthalmology) Warden Fillers; no further information available.  01/24/2021 OV (psychiatry) Loletta Specter; no further information available.  01/02/2021 OV (dermatology) Griselda Miner; no further information available.  12/27/2020 OV (neurology) Hazle Coca, PhD; no medication changes indicated.  12/24/2020 OV (dermatology) Griselda Miner; no further information available.  12/19/2020 OV (neurology) Hazle Coca, PhD; no medication changes indicated.  12/03/2020 OV (dermatology) Danella Sensing; no further information available.  11/29/2020 OV (psychiatry) Ricard Dillon; no  further information available.  Hospital visits:  None in previous 6 months  Medications: Outpatient Encounter Medications as of 04/28/2021  Medication Sig   ALPRAZolam (XANAX) 0.5 MG tablet Take 1 tablet (0.5 mg total) by mouth daily as needed for anxiety.   cholecalciferol (VITAMIN D) 1000 units tablet Take 1,000 Units by mouth daily.    donepezil (ARICEPT) 10 MG tablet Take 1 tablet (10 mg total) by mouth daily.   Fluticasone Propionate (FLONASE NA) Place into the nose.   memantine (NAMENDA) 10 MG tablet Take 1 tablet (10 mg total) by mouth 2 (two) times daily.   Multiple Vitamins-Minerals (CENTRUM SILVER) tablet Take 1 tablet by mouth daily.    Omega-3 Fatty Acids (FISH OIL) 1200 MG CAPS Take by mouth.   omeprazole (PRILOSEC) 20 MG capsule Take 1 capsule (20 mg total) by mouth daily.   risperiDONE (RISPERDAL) 0.25 MG tablet Take 0.25 mg by mouth in the morning and at bedtime. Take 2 as needed daily for anxiety   rosuvastatin (CRESTOR) 5 MG tablet TAKE 1/2 TABLET EVERY DAY   tamsulosin (FLOMAX) 0.4 MG CAPS capsule Take 0.4 mg by mouth.   No facility-administered encounter medications on file as of 04/28/2021.   Current Medications: Omeprazole 20 mg - not currently taking due to improvement with epigastric pain Risperidone 0.25 mg - currently taking 0.5 mg twice daily Rosuvastatin 5 mg last filled 02/01/2021 90 DS Alprazolam 0.5 mg last filled 10/15/2020 30 DS takes as needed only Tamsulosin 0.4 mg last filled 01/27/2021 90 DS Memantine 10 mg last filled 03/25/2021 90 DS Donepezil 10 mg last filled 03/25/2021 90 DS Fluticasone Propionate uses as needed Vitamin D not currently taking Omega-3 not currently taking Multiple Vitamins-Minerals (Centrum Silver) not currently taking  Patient Questions: Any changes in your medications or health? Patient's wife stated "Only the Risperidone increase to 0.5 mg. He's also had some neurologic changes which are coming back to normal.  Any  side effects from any medications?  Patient's wife stated "I don't think so."  Do you have any symptoms or problems not managed by your medications? Patient's wife stated "I don't think so."  Any concerns about your health right now? Patient's wife stated "Not really concerns, just looking forward to him getting back to himself which is getting there. After he had some cold like symptoms his personality changed, he is slowly coming back to normal."  Has your provider asked that you check blood pressure, blood sugar, or follow special diet at home? Patient's wife stated "He doesn't check blood sugar but he does check blood pressure. His blood pressure runs low." She also stated he does not follow any special diet.  Do you get any type of exercise on a regular basis? Patient's wife stated they like to walk. She states they used to do Zumba but don't anymore due to the patients knee pain.  Can you think of a goal you would like to reach for your health? Patient's wife stated "I would like to be where we were 20 years ago. I would also like to get his personality back to normal. The last week or so he's been much better."  Do you have any problems getting your medications? Patient's wife denies having any problems getting his medications.  Is there anything that you would like to discuss during the appointment?  "I just want to make sure that none of his medications interact with each other."  Please bring medications and supplements to appointment   Care Gaps: Medicare Annual Wellness: Due now Hemoglobin A1C: n/a Colonoscopy: Next due on 09/01/2021  Future Appointments  Date Time Provider [place]  05/05/2021  9:30 AM [facility] CCM PHARMACIST [facility]  06/10/2021 12:45 PM Suzzanne Cloud, NP [facility] None  08/06/2021 10:00 AM [name], MD [facility]  12/12/2021  1:00 PM Hazle Coca, PhD [facility] None  12/12/2021  2:00 PM [facility]OPSYCH TECH [facility] None  12/19/2021  10:00 AM Hazle Coca, PhD [facility] None    Star Rating Drugs: Rosuvastatin 5 mg last filled 02/01/2021 90 DS  April D Calhoun, [place] Pharmacist Assistant [phone]

## 2021-05-05 ENCOUNTER — Other Ambulatory Visit: Payer: Self-pay

## 2021-05-05 ENCOUNTER — Ambulatory Visit (INDEPENDENT_AMBULATORY_CARE_PROVIDER_SITE_OTHER): Payer: Medicare HMO | Admitting: Pharmacist

## 2021-05-05 DIAGNOSIS — E782 Mixed hyperlipidemia: Secondary | ICD-10-CM

## 2021-05-05 DIAGNOSIS — G3184 Mild cognitive impairment, so stated: Secondary | ICD-10-CM

## 2021-05-05 DIAGNOSIS — J309 Allergic rhinitis, unspecified: Secondary | ICD-10-CM

## 2021-05-05 DIAGNOSIS — I950 Idiopathic hypotension: Secondary | ICD-10-CM

## 2021-05-05 NOTE — Patient Instructions (Addendum)
Visit Information   Goals Addressed             This Visit's Progress    Minimize dizziness       Timeframe:  Long-Range Goal Priority:  High Start Date: 05/05/21                            Expected End Date: 11/02/20                      Follow Up Date 08/05/20    Alleviate medication related dizziness, cloudiness.   Why is this important?   These steps will help you keep on track with your medicines.   Notes:        Patient Care Plan: General Pharmacy (Adult)     Problem Identified: Hypotension, Allergic rhinitis, HLD, Mild neurocognitive disorder   Priority: High  Onset Date: 05/05/2021     Long-Range Goal: Patient-Specific Goal   Start Date: 05/05/2021  Expected End Date: 11/02/2021  This Visit's Progress: On track  Priority: High  Note:   Current Barriers:  Adverse effects with Risperdal  Pharmacist Clinical Goal(s):  Patient will achieve improvement in symptoms of cloudiness as evidenced by symptom level through collaboration with PharmD and provider.   Interventions: 1:1 collaboration with [name], MD regarding development and update of comprehensive plan of care as evidenced by provider attestation and co-signature Inter-disciplinary care team collaboration (see longitudinal plan of care) Comprehensive medication review performed; medication list updated in electronic medical record  Hypotension (BP goal Prevent Symptoms) -Controlled -Current treatment: None noted -Medications previously tried: none noted  -Current home readings: logs not provided today, reports he does "run low." Occasionally really low number but has not checked within last week or so. -Current dietary habits: eats relatively healthy diet, chicken and veggies, minimal sweets, occasional beer in the evening -Current exercise habits: walks daily with his wife -Reports hypotensive/hypertensive symptoms - some dizziness/cloudiness recently, this morning had an  episode -Educated on Importance of home blood pressure monitoring; Symptoms of hypotension and importance of maintaining adequate hydration; -Counseled to monitor BP at home when symptomatic, document, and provide log at future appointments -Recommended continue current management Check BP when he feels dizzy, careful when switching from sitting to standing  Hyperlipidemia: (LDL goal < 100) -Controlled -Current treatment: Rosuvastatin 5mg  one-half tablet daily -Medications previously tried: none noted  -Current dietary patterns: see HTN -Current exercise habits: see HTN -Educated on Cholesterol goals;  Benefits of statin for ASCVD risk reduction; Importance of limiting foods high in cholesterol; Most recent LDL is excellent, no adverse effects from statin -Recommended to continue current medication  Allergic Rhinitis (Goal: Minimize symptoms) -Controlled -Current treatment  Fluticasone 23mcg prn -Medications previously tried: none noted -Uses prn  -Recommended to continue current medication  Neurocognitive disorder (Goal: Minimize symptoms) -Controlled -Current treatment  Donepezil 10mg  daily Memantine 10mg  daily Risperidone 0.25mg  twice daily -Medications previously tried: citalopram, seroquel  -He does experience some "cloudiness" and dizziness occasionally.  Wife states that his anxiety and mental state have been the best in a while since he started the risperidone.  Counseled on adverse effects subsiding over time.  Did have episode of grogginess today but was still able to drive to appointment.   -Recommended to continue current medication If cloudiness does not resolve over time contact providers.  Do not drive when experiencing this feeling.  Patient Goals/Self-Care Activities Patient will:  - take  medications as prescribed as evidenced by patient report and record review focus on medication adherence by pill counts check blood pressure periodically when symptomatic,  document, and provide at future appointments  Follow Up Plan: The care management team will reach out to the patient again over the next 180 days.        Caleb Gibson was given information about Chronic Care Management services today including:  CCM service includes personalized support from designated clinical staff supervised by his physician, including individualized plan of care and coordination with other care providers 24/7 contact phone numbers for assistance for urgent and routine care needs. Standard insurance, coinsurance, copays and deductibles apply for chronic care management only during months in which we provide at least 20 minutes of these services. Most insurances cover these services at 100%, however patients may be responsible for any copay, coinsurance and/or deductible if applicable. This service may help you avoid the need for more expensive face-to-face services. Only one practitioner may furnish and bill the service in a calendar month. The patient may stop CCM services at any time (effective at the end of the month) by phone call to the office staff.  Patient agreed to services and verbal consent obtained.   The patient verbalized understanding of instructions, educational materials, and care plan provided today and agreed to receive a mailed copy of patient instructions, educational materials, and care plan.  Telephone follow up appointment with pharmacy team member scheduled for: 6 months  Edythe Clarity, [place]

## 2021-05-07 DIAGNOSIS — Z87891 Personal history of nicotine dependence: Secondary | ICD-10-CM

## 2021-05-07 DIAGNOSIS — E782 Mixed hyperlipidemia: Secondary | ICD-10-CM

## 2021-05-07 DIAGNOSIS — R419 Unspecified symptoms and signs involving cognitive functions and awareness: Secondary | ICD-10-CM | POA: Diagnosis not present

## 2021-05-07 DIAGNOSIS — I959 Hypotension, unspecified: Secondary | ICD-10-CM | POA: Diagnosis not present

## 2021-05-08 DIAGNOSIS — G3184 Mild cognitive impairment, so stated: Secondary | ICD-10-CM | POA: Diagnosis not present

## 2021-05-08 DIAGNOSIS — F419 Anxiety disorder, unspecified: Secondary | ICD-10-CM | POA: Diagnosis not present

## 2021-06-04 DIAGNOSIS — D225 Melanocytic nevi of trunk: Secondary | ICD-10-CM | POA: Diagnosis not present

## 2021-06-04 DIAGNOSIS — D0471 Carcinoma in situ of skin of right lower limb, including hip: Secondary | ICD-10-CM | POA: Diagnosis not present

## 2021-06-04 DIAGNOSIS — D1801 Hemangioma of skin and subcutaneous tissue: Secondary | ICD-10-CM | POA: Diagnosis not present

## 2021-06-04 DIAGNOSIS — L821 Other seborrheic keratosis: Secondary | ICD-10-CM | POA: Diagnosis not present

## 2021-06-04 DIAGNOSIS — L570 Actinic keratosis: Secondary | ICD-10-CM | POA: Diagnosis not present

## 2021-06-04 DIAGNOSIS — D485 Neoplasm of uncertain behavior of skin: Secondary | ICD-10-CM | POA: Diagnosis not present

## 2021-06-04 DIAGNOSIS — Z85828 Personal history of other malignant neoplasm of skin: Secondary | ICD-10-CM | POA: Diagnosis not present

## 2021-06-04 DIAGNOSIS — D045 Carcinoma in situ of skin of trunk: Secondary | ICD-10-CM | POA: Diagnosis not present

## 2021-06-05 DIAGNOSIS — F419 Anxiety disorder, unspecified: Secondary | ICD-10-CM | POA: Diagnosis not present

## 2021-06-05 DIAGNOSIS — G3184 Mild cognitive impairment, so stated: Secondary | ICD-10-CM | POA: Diagnosis not present

## 2021-06-09 ENCOUNTER — Other Ambulatory Visit: Payer: Self-pay | Admitting: Neurology
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
## 2021-06-10 ENCOUNTER — Ambulatory Visit: Payer: Medicare HMO | Admitting: Neurology

## 2021-06-10 ENCOUNTER — Encounter: Payer: Self-pay | Admitting: Neurology

## 2021-06-10 VITALS — BP 101/64 | HR 73 | Ht 70.0 in | Wt 202.5 lb

## 2021-06-10 DIAGNOSIS — G4752 REM sleep behavior disorder: Secondary | ICD-10-CM | POA: Diagnosis not present

## 2021-06-10 DIAGNOSIS — G3184 Mild cognitive impairment, so stated: Secondary | ICD-10-CM | POA: Diagnosis not present

## 2021-06-10 DIAGNOSIS — R4189 Other symptoms and signs involving cognitive functions and awareness: Secondary | ICD-10-CM

## 2021-06-10 NOTE — Patient Instructions (Addendum)
Let me discuss with Dr. Krista Blue and call your psych NP, we will get a plan together

## 2021-06-12 ENCOUNTER — Encounter: Payer: Medicare HMO | Admitting: Family Medicine

## 2021-06-17 ENCOUNTER — Other Ambulatory Visit (INDEPENDENT_AMBULATORY_CARE_PROVIDER_SITE_OTHER): Payer: Self-pay

## 2021-06-17 ENCOUNTER — Ambulatory Visit: Payer: Medicare HMO | Admitting: Neurology

## 2021-06-17 DIAGNOSIS — Z0289 Encounter for other administrative examinations: Secondary | ICD-10-CM

## 2021-06-17 DIAGNOSIS — R410 Disorientation, unspecified: Secondary | ICD-10-CM

## 2021-06-17 DIAGNOSIS — R4189 Other symptoms and signs involving cognitive functions and awareness: Secondary | ICD-10-CM

## 2021-06-19 ENCOUNTER — Telehealth: Payer: Self-pay

## 2021-06-19 LAB — THYROGLOBULIN ANTIBODY: Thyroglobulin Antibody: <1 IU/mL (ref 0.0–0.9)

## 2021-06-19 LAB — SEDIMENTATION RATE: Sed Rate: 2 mm/hr (ref 0–30)

## 2021-06-19 LAB — ANA: ANA Titer 1: NEGATIVE

## 2021-06-19 LAB — C-REACTIVE PROTEIN: CRP: <1 mg/L (ref 0–10)

## 2021-06-19 LAB — THYROID PEROXIDASE ANTIBODY: Thyroperoxidase Ab SerPl-aCnc: 9 IU/mL (ref 0–34)

## 2021-06-19 NOTE — Telephone Encounter (Signed)
I called the pt's wife and advised of result. She verbalized understanding. She also states the Pt had EEG completed on 06/17/21. Looking at epic it appears that Dr. Krista Blue is the reading physician. Final result not available yet.

## 2021-06-19 NOTE — Telephone Encounter (Signed)
-----   Message from Suzzanne Cloud, NP sent at 06/19/2021 11:43 AM EST ----- Please call his wife, blood work is unremarkable, next thing pending is EEG. I don't see that it has been scheduled.

## 2021-06-20 DIAGNOSIS — G3184 Mild cognitive impairment, so stated: Secondary | ICD-10-CM | POA: Diagnosis not present

## 2021-06-20 DIAGNOSIS — F321 Major depressive disorder, single episode, moderate: Secondary | ICD-10-CM | POA: Diagnosis not present

## 2021-06-20 DIAGNOSIS — F419 Anxiety disorder, unspecified: Secondary | ICD-10-CM | POA: Diagnosis not present

## 2021-06-25 ENCOUNTER — Encounter: Payer: Medicare HMO | Admitting: Family Medicine

## 2021-06-27 ENCOUNTER — Telehealth: Payer: Self-pay | Admitting: Neurology

## 2021-06-27 NOTE — Telephone Encounter (Signed)
Pt's wife is asking for a call with results to the EEG

## 2021-07-02 ENCOUNTER — Telehealth: Payer: Self-pay | Admitting: Neurology

## 2021-07-02 NOTE — Telephone Encounter (Signed)
Pt's wife called wanting to know when the pt will be getting a call with his EEG results. Please advise.

## 2021-07-02 NOTE — Telephone Encounter (Signed)
I was able to review notes from [facility], most recent visit from notes was June 05, 2021, at that time, his wife felt that he was overmedicated, with Trileptal, drowsy, flat, Risperdal was decreased 0.5 mg tablet.   Previous note was May 08, 2021, at that time he was doing well, he was taking Risperdal 0.5 mg, 1 tablet twice daily.  Seroquel was stopped back in October, was switched to Risperdal, Seroquel made him feel lightheaded.

## 2021-07-03 ENCOUNTER — Encounter: Payer: Medicare HMO | Admitting: Family Medicine

## 2021-07-07 NOTE — Procedures (Signed)
° °  HISTORY: 81 year old male, with history of memory loss, sleep disorder acting out of dreams.  TECHNIQUE:  This is a routine 16 channel EEG recording with one channel devoted to a limited EKG recording.  It was performed during wakefulness, drowsiness and asleep.  Hyperventilation and photic stimulation were performed as activating procedures.  There are minimum muscle and movement artifact noted.  Upon maximum arousal, posterior dominant waking rhythm consistent of mild dysrhythmic theta range activity, activities are symmetric over the bilateral posterior derivations and attenuated with eye opening.  Hyperventilation produced mild/moderate buildup with higher amplitude and the slower activities noted.  Photic stimulation did not alter the tracing.  During EEG recording, patient developed drowsiness and no deeper stage of sleep was achieved.  During EEG recording, there was no epileptiform discharge noted.  EKG demonstrate sinus rhythm, with heart rate of 56 bpm  CONCLUSION: This is a  normal abnormal awake EEG.  There is electrodiagnostic evidence of generalized background slowing, consistent with mild bihemisphere malfunction. Common etiology is metabolic toxic or  central nervous system degenerative disorder.  There is no epileptiform discharge. Marcial Pacas, M.D. Ph.D.  [facility] [place], [place] [address] Phone: [phone] Fax:      [phone]

## 2021-07-08 NOTE — Telephone Encounter (Signed)
Suzzanne Cloud, NP  P Gna-Pod 2 Results Please call the patient, EEG showed generalized background slowing, could be related to metabolic imbalance or CNS degenerative disorder. There was no seizure activity. For now, continue with current plan, monitor symptoms, continue to follow with psych, keep appointment with Dr. Krista Blue in April.   CONCLUSION:  This is a  normal abnormal awake EEG.  There is electrodiagnostic evidence of generalized background slowing, consistent with mild bihemisphere malfunction. Common etiology is metabolic toxic or  central nervous system degenerative disorder.  There is no epileptiform discharge.  Caleb Gibson, M.D. Ph.D.

## 2021-07-08 NOTE — Telephone Encounter (Signed)
I spoke to the patient and his wife. They verbalized understanding of the results and plan.

## 2021-07-08 NOTE — Telephone Encounter (Signed)
Attempted to call pt, LVM for call back  °

## 2021-07-18 DIAGNOSIS — G3184 Mild cognitive impairment, so stated: Secondary | ICD-10-CM | POA: Diagnosis not present

## 2021-07-18 DIAGNOSIS — F321 Major depressive disorder, single episode, moderate: Secondary | ICD-10-CM | POA: Diagnosis not present

## 2021-07-18 DIAGNOSIS — F419 Anxiety disorder, unspecified: Secondary | ICD-10-CM | POA: Diagnosis not present

## 2021-08-04 DIAGNOSIS — C61 Malignant neoplasm of prostate: Secondary | ICD-10-CM | POA: Diagnosis not present

## 2021-08-04 LAB — PSA

## 2021-08-05 ENCOUNTER — Telehealth: Payer: Self-pay | Admitting: Pharmacist

## 2021-08-05 NOTE — Progress Notes (Signed)
° ° °  Chronic Care Management Pharmacy Assistant   Name: Caleb Gibson  MRN: [id number] DOB: 1940/09/05   Reason for Encounter: General Adherence Call    Recent office visits:    Recent consult visits:  07/02/2021 TE (Neurology) Suzzanne Cloud, NP; I was able to review notes from [facility], most recent visit from notes was June 05, 2021, at that time, his wife felt that he was overmedicated, with Trileptal, drowsy, flat, Risperdal was decreased 0.5 mg tablet.    Previous note was May 08, 2021, at that time he was doing well, he was taking Risperdal 0.5 mg, 1 tablet twice daily.   Seroquel was stopped back in October, was switched to Risperdal, Seroquel made him feel lightheaded.  06/17/2021 Procedure Visit (Neurology) Marcial Pacas, MD;  Hyperventilation and photic stimulation were performed as activating procedures.  There are minimum muscle and movement artifact noted. -No medication changes indicated.  06/10/2021 (Neurology) Suzzanne Cloud, NP; no medication changes indicated.  Hospital visits:  None in previous 6 months  Medications: Outpatient Encounter Medications as of 08/05/2021  Medication Sig   ALPRAZolam (XANAX) 0.5 MG tablet Take 1 tablet (0.5 mg total) by mouth daily as needed for anxiety.   cholecalciferol (VITAMIN D) 1000 units tablet Take 1,000 Units by mouth daily.    donepezil (ARICEPT) 10 MG tablet TAKE 1 TABLET EVERY DAY   Fluticasone Propionate (FLONASE NA) Place into the nose.   memantine (NAMENDA) 10 MG tablet TAKE 1 TABLET TWICE DAILY   Multiple Vitamins-Minerals (CENTRUM SILVER) tablet Take 1 tablet by mouth daily.    risperiDONE (RISPERDAL) 0.5 MG tablet Take 0.25 mg by mouth daily. Take 2 as needed daily for anxiety   rosuvastatin (CRESTOR) 5 MG tablet TAKE 1/2 TABLET EVERY DAY   tamsulosin (FLOMAX) 0.4 MG CAPS capsule Take 0.4 mg by mouth.   No facility-administered encounter medications on file as of 08/05/2021.    Patient Questions: Have you had any problems recently with your health? Patient's wife states the patient hasn't had any problems recently with his health.  Have you had any problems with your pharmacy? Patient's wife denies having any problems with their pharmacy.  What issues or side effects are you having with your medications? Patients wife states the patient recently started taking Sertraline. He is not have any troubles or issues with this medication so far. She also states the patient started taking Tamsulosin at night as recommended which has "helped a lot."  What would you like me to pass along to Leata Mouse, CPP for him to help you with?  She states the patient is not feeling dizzy as much as he was.   What can we do to take care of you better? Patient's wife did not have any suggestions. They are happy with his current level of care at this time.  Care Gaps: Medicare Annual Wellness: Overdue last AWV 10/05/2019 Hemoglobin A1C: none available Colonoscopy: Completed  Future Appointments  Date Time Provider [place]  08/06/2021 10:00 AM [name], MD [facility]  09/09/2021  2:00 PM Marcial Pacas, MD [facility] None  11/04/2021  2:45 PM [facility]CM PHARMACIST [facility]  12/12/2021  1:00 PM Hazle Coca, PhD [facility] None  12/12/2021  2:00 PM [facility] NEUROPSYCH TECH [facility] None  12/19/2021 10:00 AM Hazle Coca, PhD [facility] None   Star Rating Drugs: Rosuvastatin 5 mg last filled 04/18/2021 90 DS  April D Calhoun, [place] Pharmacist Assistant [phone]

## 2021-08-06 ENCOUNTER — Ambulatory Visit (INDEPENDENT_AMBULATORY_CARE_PROVIDER_SITE_OTHER): Payer: Medicare HMO | Admitting: Family Medicine

## 2021-08-06 ENCOUNTER — Encounter: Payer: Self-pay | Admitting: Family Medicine

## 2021-08-06 ENCOUNTER — Other Ambulatory Visit: Payer: Self-pay

## 2021-08-06 VITALS — BP 100/58 | HR 64 | Temp 97.2°F | Ht 70.0 in | Wt 206.8 lb

## 2021-08-06 DIAGNOSIS — Z0000 Encounter for general adult medical examination without abnormal findings: Secondary | ICD-10-CM

## 2021-08-06 DIAGNOSIS — E782 Mixed hyperlipidemia: Secondary | ICD-10-CM | POA: Diagnosis not present

## 2021-08-06 DIAGNOSIS — D126 Benign neoplasm of colon, unspecified: Secondary | ICD-10-CM | POA: Diagnosis not present

## 2021-08-06 DIAGNOSIS — G3184 Mild cognitive impairment, so stated: Secondary | ICD-10-CM | POA: Diagnosis not present

## 2021-08-06 DIAGNOSIS — F411 Generalized anxiety disorder: Secondary | ICD-10-CM

## 2021-08-06 DIAGNOSIS — C61 Malignant neoplasm of prostate: Secondary | ICD-10-CM | POA: Diagnosis not present

## 2021-08-06 DIAGNOSIS — I950 Idiopathic hypotension: Secondary | ICD-10-CM

## 2021-08-06 HISTORY — DX: Generalized anxiety disorder: F41.1

## 2021-08-06 LAB — COMPREHENSIVE METABOLIC PANEL
ALT: 11 U/L (ref 0–53)
AST: 12 U/L (ref 0–37)
Albumin: 4.4 g/dL (ref 3.5–5.2)
Alkaline Phosphatase: 64 U/L (ref 39–117)
BUN: 17 mg/dL (ref 6–23)
CO2: 27 mEq/L (ref 19–32)
Calcium: 9.3 mg/dL (ref 8.4–10.5)
Chloride: 105 mEq/L (ref 96–112)
Creatinine, Ser: 1.04 mg/dL (ref 0.40–1.50)
GFR: 67.43 mL/min (ref >60.00)
Glucose, Bld: 103 mg/dL — ABNORMAL HIGH (ref 70–99)
Potassium: 4.5 mEq/L (ref 3.5–5.1)
Sodium: 138 mEq/L (ref 135–145)
Total Bilirubin: 0.6 mg/dL (ref 0.2–1.2)
Total Protein: 6.2 g/dL (ref 6.0–8.3)

## 2021-08-06 LAB — LIPID PANEL
Cholesterol: 150 mg/dL (ref 0–200)
HDL: 70.3 mg/dL (ref >39.00)
LDL Cholesterol: 71 mg/dL (ref 0–99)
NonHDL: 79.56
Total CHOL/HDL Ratio: 2
Triglycerides: 43 mg/dL (ref 0.0–149.0)
VLDL: 8.6 mg/dL (ref 0.0–40.0)

## 2021-08-06 LAB — TSH: TSH: 1.42 u[IU]/mL (ref 0.35–5.50)

## 2021-08-06 NOTE — Progress Notes (Signed)
Subjective  Chief Complaint  Patient presents with   Annual Exam    Fasting    HPI: Caleb Gibson is a 81 y.o. male who presents to [facility] [place] today for a Male Wellness Visit. He also has the concerns and/or needs as listed above in the chief complaint. These will be addressed in addition to the Health Maintenance Visit.   Wellness Visit: annual visit with health maintenance review and exam   Health maintenance: Immunizations are up-to-date.  Colorectal cancer screening may be due.  Had colonoscopy in 2018 that showed tubular adenoma and repeat was recommended in 5 years.  This was done with Dr[facility] Shary [facility].  Body mass index is 29.67 kg/m. 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  Medications have been adjusted.  Now on Seroquel and Zoloft.  Fortunately, he is much improved.  Both he and his wife have noticed improvement in cognition, mood, anxiety.  He is driving again.  Acting more like himself.  Appetite is improved.  They are thrilled. Idiopathic hypotension: No recent symptomatic episodes.  No palpitations or racing heart.  No sweats. History of prostate cancer: Recently saw urology.  PSA is pending. History of tubular adenoma in 2018. Hyperlipidemia on low-dose Crestor.  Fasting for recheck.  Has been well controlled  Patient Active Problem List   Diagnosis Date Noted   Mild neurocognitive disorder 05/10/2018   History of prostate cancer 11/22/2017   Tubular adenoma of colon 06/15/2017   On statin therapy due to risk of future cardiovascular event 10/08/2015   Mixed hyperlipidemia 09/27/2014   History of traumatic head injury    Idiopathic  hypotension 01/07/2016   BPH (benign prostatic hyperplasia) 10/01/2011   Erectile dysfunction 10/01/2011   Osteoarthritis, knee 08/01/2010   Claustrophobia 07/08/2009   Basal cell carcinoma of skin 06/15/2017   Simple cyst of kidney 11/12/2011   Allergic rhinitis 08/27/2011   GAD (generalized anxiety disorder) 08/06/2021   REM behavioral disorder    Health Maintenance  Topic Date Due   INFLUENZA VACCINE  09/05/2021 (Originally 01/06/2021)   COLONOSCOPY (Pts 45-65yrs Insurance coverage will need to be confirmed)  09/01/2021   TETANUS/TDAP  09/30/2021   Pneumonia Vaccine 67+ Years old  Completed   Zoster Vaccines- Shingrix  Completed   HPV VACCINES  Aged Out   COVID-19 Vaccine  Discontinued   Immunization History  Administered Date(s) Administered   Influenza Split 03/14/2011   Influenza, High Dose Seasonal PF 02/27/2012, 02/05/2014, 03/10/2015, 04/14/2016, 03/02/2017, 02/23/2018, 01/26/2019, 01/26/2019   Influenza, Seasonal, Injecte, Preservative Fre 02/27/2013   Influenza-Unspecified 04/15/2017, 03/08/2020   Pneumococcal Conjugate-13 02/05/2014   Pneumococcal Polysaccharide-23 07/08/2009   Td 11/15/2007   Tdap 10/01/2011   Zoster Recombinat (Shingrix) 02/05/2018, 04/18/2018   Zoster, Live 06/20/2010   We updated and reviewed the patient's past history in detail and it is documented below. Allergies: Patient is allergic to viagra [sildenafil citrate]. Past Medical History  has a past medical history of Allergic rhinitis (08/27/2011), Basal cell carcinoma of skin, BPH (benign prostatic hyperplasia) (10/01/2011), Claustrophobia, Generalized anxiety disorder, History of prostate cancer (11/22/2017), History of traumatic head injury, Idiopathic hypotension (01/07/2016), Mild cognitive impairment (05/10/2018), Mixed hyperlipidemia (  09/27/2014), Night terrors, Osteoarthritis, knee (08/01/2010), REM behavioral disorder, Simple cyst of kidney (11/12/2011), and Tubular adenoma of colon  (06/15/2017). Past Surgical History Patient  has a past surgical history that includes Appendectomy; Dental surgery; Rhinoplasty; Tonsillectomy and adenoidectomy; Mohs surgery; and Knee arthroscopy with meniscal repair (Right). Social History Patient  reports that he quit smoking about 27 years ago. His smoking use included cigarettes. He has never used smokeless tobacco. He reports current alcohol use of about 10.0 standard drinks per week. He reports that he does not use drugs. Family History family history includes Alcohol abuse in his maternal aunt and mother; Cirrhosis in his maternal aunt; Diabetes in his father; Glaucoma in his father; Heart disease in his maternal grandmother; Lymphoma in his mother; Melanoma in his mother; Mental illness in his mother; Prostate cancer in his father. Review of Systems: Constitutional: negative for fever or malaise Ophthalmic: negative for photophobia, double vision or loss of vision Cardiovascular: negative for chest pain, dyspnea on exertion, or new LE swelling Respiratory: negative for SOB or persistent cough Gastrointestinal: negative for abdominal pain, change in bowel habits or melena Genitourinary: negative for dysuria or gross hematuria Musculoskeletal: negative for new gait disturbance or muscular weakness Integumentary: negative for new or persistent rashes Neurological: negative for TIA or stroke symptoms Psychiatric: negative for SI or delusions Allergic/Immunologic: negative for hives  Patient Care Team    Relationship Specialty Notifications Start End  [name], MD PCP - General Family Medicine  06/15/17   Ceasar Mons, MD Consulting Physician Urology  01/13/18   Manson Passey, [facility]ist  01/13/18   Danella Sensing, MD Consulting Physician Dermatology  01/13/18   Ronnald Ramp  Dentistry  01/13/18   Marcial Pacas, MD Consulting Physician Neurology  11/30/18   Susette Racer, MD Consulting Physician Endocrinology  11/20/19   Edythe Clarity, [facility] Pharmacist Pharmacist  03/13/21    Comment: [phone]  Lucienne Capers, MD Consulting Physician Gastroenterology  08/06/21    Objective  Vitals: BP (!) 100/58    Pulse 64    Temp (!) 97.2 F (36.2 C) (Temporal)    Ht 5\' 10"  (1.778 m)    Wt 206 lb 12.8 oz (93.8 kg)    SpO2 99%    BMI 29.67 kg/m  General:  Well developed, well nourished, no acute distress  Psych:  Alert and orientedx3,normal mood and affect HEENT:  Normocephalic, atraumatic, non-icteric sclera, PERRL, oropharynx is clear without mass or exudate, supple neck without adenopathy, mass or thyromegaly Cardiovascular:  Normal S1, S2, RRR without gallop, rub or murmur, nondisplaced PMI, +2 distal pulses in bilateral upper and lower extremities. Respiratory:  Good breath sounds bilaterally, CTAB with normal respiratory effort Gastrointestinal: normal bowel sounds, soft, non-tender, no noted masses. No HSM MSK: no deformities, contusions. Joints are without erythema or swelling. Spine and CVA region are nontender Skin:  Warm, no rashes or suspicious lesions noted    Assessment  1. Annual physical exam   2. Idiopathic hypotension   3. Mixed hyperlipidemia   4. Mild neurocognitive disorder   5. GAD (generalized anxiety disorder)   6. Tubular adenoma of colon   7. History of prostate cancer      Plan  Male Wellness Visit: Age appropriate Health Maintenance and Prevention measures were discussed with patient. Included topics are cancer screening recommendations, ways to keep healthy (see AVS) including dietary and exercise recommendations, regular eye and dental care, use of seat belts, and avoidance of moderate alcohol use and tobacco  use.  BMI: discussed patient's BMI and encouraged positive lifestyle modifications to help get to or maintain a target BMI. HM needs and immunizations were addressed and ordered. See below for orders. See HM and immunization section for updates. Routine labs and screening tests  ordered including cmp, cbc and lipids where appropriate. Discussed recommendations regarding Vit D and calcium supplementation (see AVS)  Chronic disease f/u and/or acute problem visit: (deemed necessary to be done in addition to the wellness visit): Blood pressure stable. Recheck lipids on Crestor 5.  Check LFTs. Neurocognitive disorder and anxiety: Now much better controlled on Seroquel and Zoloft.  Negative neurology work-up with brain MRI and EEG. History of tubular adenoma: We will [facility].  Due for repeat colonoscopy for colon cancer screening.  This should be his last 1 if cleared. Follow-up with urology for history of prostate cancer.  He has been cured.  Follow up: 6 months for recheck Commons side effects, risks, benefits, and alternatives for medications and treatment plan prescribed today were discussed, and the patient expressed understanding of the given instructions. Patient is instructed to call or message via MyChart if he/she has any questions or concerns regarding our treatment plan. No barriers to understanding were identified. We discussed Red Flag symptoms and signs in detail. Patient expressed understanding regarding what to do in case of urgent or emergency type symptoms.  Medication list was reconciled, printed and provided to the patient in AVS. Patient instructions and summary information was reviewed with the patient as documented in the AVS. This note was prepared with assistance of Dragon voice recognition software. Occasional wrong-word or sound-a-like substitutions may have occurred due to the inherent limitations of voice recognition software  This visit occurred during the SARS-CoV-2 public health emergency.  Safety protocols were in place, including screening questions prior to the visit, additional usage of staff PPE, and extensive cleaning of exam room while observing appropriate contact time as indicated  for disinfecting solutions.   Orders Placed This Encounter  Procedures   Lipid panel   Comprehensive metabolic panel   TSH   Ambulatory referral to Gastroenterology   No orders of the defined types were placed in this encounter.

## 2021-08-06 NOTE — Patient Instructions (Signed)
Please return in 6 months for recheck.   I will release your lab results to you on your MyChart account with further instructions. You may see the results before I do, but when I review them I will send you a message with my report or have my assistant call you if things need to be discussed. Please reply to my message with any questions. Thank you!   If you have any questions or concerns, please don't hesitate to send me a message via MyChart or call the office at [phone]. Thank you for visiting with us today! It's our pleasure caring for you.  

## 2021-08-08 DIAGNOSIS — G3184 Mild cognitive impairment, so stated: Secondary | ICD-10-CM | POA: Diagnosis not present

## 2021-08-08 DIAGNOSIS — F419 Anxiety disorder, unspecified: Secondary | ICD-10-CM | POA: Diagnosis not present

## 2021-08-08 DIAGNOSIS — F321 Major depressive disorder, single episode, moderate: Secondary | ICD-10-CM | POA: Diagnosis not present

## 2021-08-10 NOTE — Progress Notes (Signed)
Please call patient: I have reviewed his/her lab results. Lab results are all stable.

## 2021-08-21 ENCOUNTER — Encounter: Payer: Self-pay | Admitting: Family Medicine

## 2021-09-09 ENCOUNTER — Encounter: Payer: Self-pay | Admitting: Neurology

## 2021-09-09 ENCOUNTER — Ambulatory Visit: Payer: Medicare HMO | Admitting: Neurology

## 2021-09-09 VITALS — BP 97/61 | HR 75 | Ht 70.0 in | Wt 212.5 lb

## 2021-09-09 DIAGNOSIS — G4752 REM sleep behavior disorder: Secondary | ICD-10-CM | POA: Diagnosis not present

## 2021-09-09 DIAGNOSIS — G20 Parkinson's disease: Secondary | ICD-10-CM | POA: Diagnosis not present

## 2021-09-09 DIAGNOSIS — G3184 Mild cognitive impairment, so stated: Secondary | ICD-10-CM | POA: Diagnosis not present

## 2021-09-09 HISTORY — DX: Parkinson's disease: G20
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
## 2021-09-15 ENCOUNTER — Ambulatory Visit: Payer: Medicare HMO | Admitting: Neurology

## 2021-09-15 DIAGNOSIS — G3184 Mild cognitive impairment, so stated: Secondary | ICD-10-CM | POA: Diagnosis not present

## 2021-09-15 DIAGNOSIS — F419 Anxiety disorder, unspecified: Secondary | ICD-10-CM | POA: Diagnosis not present

## 2021-09-15 DIAGNOSIS — F321 Major depressive disorder, single episode, moderate: Secondary | ICD-10-CM | POA: Diagnosis not present

## 2021-09-16 ENCOUNTER — Telehealth: Payer: Self-pay | Admitting: Neurology

## 2021-09-16 DIAGNOSIS — F419 Anxiety disorder, unspecified: Secondary | ICD-10-CM | POA: Diagnosis not present

## 2021-09-16 DIAGNOSIS — F321 Major depressive disorder, single episode, moderate: Secondary | ICD-10-CM | POA: Diagnosis not present

## 2021-09-16 NOTE — Telephone Encounter (Signed)
[employer] no auth req spoke to Gal T ref # [id number] sent to Shanon Brow with Nuclear medicine He will reach out to the patient to schedule.  ?

## 2021-09-24 NOTE — Progress Notes (Signed)
? ?Chronic Care Management ?Pharmacy Note ? ?09/30/2021 ?Name:  Caleb Gibson MRN:  [id number] DOB:  September 08, 1940 ? ?Summary: ?PharmD FU.  Patient started new meds and does feel like Ritalin is helping motivation in the mornings.  Still having periods of unexplained brain fog and periods of anger.  Has FU for Parkinson's testing upcoming.  No changes to meds at this time.  Could consider increase of Zoloft if anger persists or worsens. ? ? ?Subjective: ?Caleb Gibson is an 81 y.o. year old male who is a primary patient of [name], MD.  The CCM team was consulted for assistance with disease management and care coordination needs.   ? ?Engaged with patient face to face for follow up visit in response to provider referral for pharmacy case management and/or care coordination services.  ? ?Consent to Services:  ?The patient was given the following information about Chronic Care Management services today, agreed to services, and gave verbal consent: 1. CCM service includes personalized support from designated clinical staff supervised by the primary care provider, including individualized plan of care and coordination with other care providers 2. 24/7 contact phone numbers for assistance for urgent and routine care needs. 3. Service will only be billed when office clinical staff spend 20 minutes or more in a month to coordinate care. 4. Only one practitioner may furnish and bill the service in a calendar month. 5.The patient may stop CCM services at any time (effective at the end of the month) by phone call to the office staff. 6. The patient will be responsible for cost sharing (co-pay) of up to 20% of the service fee (after annual deductible is met). Patient agreed to services and consent obtained. ? ?Patient Care Team: ?[name], MD as PCP - General (Family Medicine) ?Ceasar Mons, MD as Consulting Physician (Urology) ?[facility] (Specialist) ?Danella Sensing, MD as Consulting Physician  (Dermatology) ?Ronnald Ramp (Dentistry) ?Marcial Pacas, MD as Consulting Physician (Neurology) ?Susette Racer, MD as Consulting Physician (Endocrinology) ?Edythe Clarity, [facility] as Pharmacist (Pharmacist) ?Lucienne Capers, MD as Consulting Physician (Gastroenterology) ?Loletta Specter, NP as Nurse Practitioner (Psychiatry) ? ?Recent office visits:  ?04/04/2021 OV (PCP) [name], MD;  I recommend trying risperidone again. Treat constipation with stool softener and MiraLAX.  Start omeprazole for epigastric pain. ?  ?03/13/2021 VV Jeanie Sewer, NP; start on molnupiravir EUA. I am also having him maintain his CALCIUM CITRATE PO, Amino Acids (L-CARNITINE PO), vitamin C, Centrum Silver, Lycopene, glucosamine-chondroitin, Multiple Vitamins-Minerals (EYE VITAMINS PO), cholecalciferol, Fish Oil, Fluticasone Propionate (FLONASE NA), memantine, donepezil, tamsulosin, ALPRAZolam, QUEtiapine, and rosuvastatin. ?  ?11/12/2020 OV (PCP) [name], MD; continue current medications, no medication changes indicated. ?  ?Recent consult visits:  ?02/24/2021 OV (psychiatry) Loletta Specter; no further information available. ?  ?02/03/2021 OV (urology) Winter. Conception Oms; no further information available. ?  ?01/27/2021 OV (ophthalmology) Warden Fillers; no further information available. ?  ?01/24/2021 OV (psychiatry) Loletta Specter; no further information available. ?  ?01/02/2021 OV (dermatology) Griselda Miner; no further information available. ?  ?12/27/2020 OV (neurology) Hazle Coca, PhD; no medication changes indicated. ?  ?12/24/2020 OV (dermatology) Griselda Miner; no further information available. ?  ?12/19/2020 OV (neurology) Hazle Coca, PhD; no medication changes indicated. ?  ?12/03/2020 OV (dermatology) Danella Sensing; no further information available. ?  ?11/29/2020 OV (psychiatry) Terrilyn Saver [place]; no further information available. ?  ?Hospital visits:  ?None in previous 6 months ?   ? ? ?Objective: ? ?Lab Results  ?Component Value  Date  ? CREATININE 1.04 08/06/2021  ? BUN 17 08/06/2021  ? GFR 67.43 08/06/2021  ? GFRNONAA 75 05/15/2020  ? GFRAA 87 05/15/2020  ? NA 138 08/06/2021  ? K 4.5 08/06/2021  ? CALCIUM 9.3 08/06/2021  ? CO2 27 08/06/2021  ? GLUCOSE 103 (H) 08/06/2021  ? ? ?Lab Results  ?Component Value Date/Time  ? GFR 67.43 08/06/2021 10:49 AM  ? GFR 76.31 04/04/2021 11:43 AM  ?  ?Last diabetic Eye exam: No results found for: HMDIABEYEEXA  ?Last diabetic Foot exam: No results found for: HMDIABFOOTEX  ? ?Lab Results  ?Component Value Date  ? CHOL 150 08/06/2021  ? HDL 70.30 08/06/2021  ? [place] 71 08/06/2021  ? TRIG 43.0 08/06/2021  ? CHOLHDL 2 08/06/2021  ? ? ? ?  Latest Ref Rng & Units 08/06/2021  ? 10:49 AM 04/04/2021  ? 11:43 AM 10/21/2020  ?  9:45 AM  ?Hepatic Function  ?Total Protein 6.0 - 8.3 g/dL 6.2   6.4   6.2    ?Albumin 3.5 - 5.2 g/dL 4.4   4.3   4.5    ?AST 0 - 37 U/L $Remo'12   14   11    'YEzNk$ ?ALT 0 - 53 U/L $Remo'11   12   11    'XclLm$ ?Alk Phosphatase 39 - 117 U/L 64   71   60    ?Total Bilirubin 0.2 - 1.2 mg/dL 0.6   0.5   0.7    ? ? ?Lab Results  ?Component Value Date/Time  ? TSH 1.42 08/06/2021 10:49 AM  ? TSH 1.12 10/21/2020 09:45 AM  ? ? ? ?  Latest Ref Rng & Units 04/04/2021  ? 11:43 AM 10/21/2020  ?  9:45 AM 07/16/2020  ?  8:29 AM  ?CBC  ?WBC 4.0 - 10.5 K/uL 4.1   3.7   3.7    ?Hemoglobin 13.0 - 17.0 g/dL 13.1   13.1   12.9    ?Hematocrit 39.0 - 52.0 % 38.6   37.9   37.5    ?Platelets 150.0 - 400.0 K/uL 208.0   188.0   183.0    ? ? ?Lab Results  ?Component Value Date/Time  ? VD25OH 58.22 09/29/2019 10:33 AM  ? ? ?Clinical ASCVD: No  ?The ASCVD Risk score (Arnett DK, et al., 2019) failed to calculate for the following reasons: ?  The 2019 ASCVD risk score is only valid for ages 70 to 100   ? ? ?  08/06/2021  ? 10:00 AM 07/16/2020  ?  8:05 AM 11/20/2019  ?  3:42 PM  ?Depression screen PHQ 2/9  ?Decreased Interest 0 0 0  ?Down, Depressed, Hopeless 0 0 0  ?PHQ - 2 Score 0 0 0  ?  ? ? ?Social History   ? ?Tobacco Use  ?Smoking Status Former  ? Types: Cigarettes  ? Quit date: 53  ? Years since quitting: 27.3  ?Smokeless Tobacco Never  ? ?BP Readings from Last 3 Encounters:  ?09/09/21 97/61  ?08/06/21 (!) 100/58  ?06/10/21 101/64  ? ?Pulse Readings from Last 3 Encounters:  ?09/09/21 75  ?08/06/21 64  ?06/10/21 73  ? ?Wt Readings from Last 3 Encounters:  ?09/09/21 212 lb 8 oz (96.4 kg)  ?08/06/21 206 lb 12.8 oz (93.8 kg)  ?06/10/21 202 lb 8 oz (91.9 kg)  ? ?BMI Readings from Last 3 Encounters:  ?09/09/21 30.49 kg/m?  ?08/06/21 29.67 kg/m?  ?06/10/21 29.06 kg/m?  ? ? ?Assessment/Interventions: Review of patient past medical  history, allergies, medications, health status, including review of consultants reports, laboratory and other test data, was performed as part of comprehensive evaluation and provision of chronic care management services.  ? ?SDOH:  (Social Determinants of Health) assessments and interventions performed: Yes ? ?Financial Resource Strain: Low Risk   ? Difficulty of Paying Living Expenses: Not very hard  ? ? ?SDOH Screenings  ? ?Alcohol Screen: Not on file  ?Depression (PHQ2-9): Low Risk   ? PHQ-2 Score: 0  ?Financial Resource Strain: Low Risk   ? Difficulty of Paying Living Expenses: Not very hard  ?Food Insecurity: Not on file  ?Housing: Not on file  ?Physical Activity: Not on file  ?Social Connections: Not on file  ?Stress: Not on file  ?Tobacco Use: Medium Risk  ? Smoking Tobacco Use: Former  ? Smokeless Tobacco Use: Never  ? Passive Exposure: Not on file  ?Transportation Needs: Not on file  ? ? ?[place] ? ?Allergies  ?Allergen Reactions  ? Viagra [Sildenafil Citrate] Other (See Comments)  ?  Hallucinations ?  ? ? ?Medications Reviewed Today   ? ? Reviewed by Edythe Clarity, RPH (Pharmacist) on 09/30/21 at 1527  Med List Status: <None>  ? ?Medication Order Taking? Sig Documenting Provider Last Dose Status Informant  ?ALPRAZolam (XANAX) 0.5 MG tablet [id number] Yes Take 1 tablet (0.5  mg total) by mouth daily as needed for anxiety. [name], MD Taking Active   ?cholecalciferol (VITAMIN D) 1000 units tablet [id number] Yes Take 1,000 Units by mouth daily.  [provider] Taking Act

## 2021-09-30 ENCOUNTER — Ambulatory Visit (INDEPENDENT_AMBULATORY_CARE_PROVIDER_SITE_OTHER): Payer: Medicare HMO | Admitting: Pharmacist

## 2021-09-30 DIAGNOSIS — E785 Hyperlipidemia, unspecified: Secondary | ICD-10-CM

## 2021-09-30 DIAGNOSIS — G3184 Mild cognitive impairment, so stated: Secondary | ICD-10-CM

## 2021-09-30 NOTE — Patient Instructions (Addendum)
Visit Information ? ? Goals Addressed   ? ?  ?  ?  ?  ? This Visit's Progress  ?  Minimize dizziness   On track  ?  Timeframe:  Long-Range Goal ?Priority:  High ?Start Date: 05/05/21                            ?Expected End Date: 11/02/20                     ? ?Follow Up Date 08/05/20  ?  ?Alleviate medication related dizziness, cloudiness. ?  ?Why is this important?   ?These steps will help you keep on track with your medicines. ?  ?Notes:  ?  ? ?  ? ?Patient Care Plan: General Pharmacy (Adult)  ?  ? ?Problem Identified: Hypotension, Allergic rhinitis, HLD, Mild neurocognitive disorder   ?Priority: High  ?Onset Date: 05/05/2021  ?  ? ?Long-Range Goal: Patient-Specific Goal   ?Start Date: 05/05/2021  ?Expected End Date: 11/02/2021  ?Recent Progress: On track  ?Priority: High  ?Note:   ?Current Barriers:  ?Ongoing brain fog ? ?Pharmacist Clinical Goal(s):  ?Patient will achieve improvement in symptoms of cloudiness as evidenced by symptom level through collaboration with PharmD and provider.  ? ?Interventions: ?1:1 collaboration with [name], MD regarding development and update of comprehensive plan of care as evidenced by provider attestation and co-signature ?Inter-disciplinary care team collaboration (see longitudinal plan of care) ?Comprehensive medication review performed; medication list updated in electronic medical record ? ?Hypotension (BP goal Prevent Symptoms) ?-Controlled ?-Current treatment: ?None noted ?-Medications previously tried: none noted  ?-Current home readings: logs not provided today, reports he does "run low." Occasionally really low number but has not checked within last week or so. ?-Current dietary habits: eats relatively healthy diet, chicken and veggies, minimal sweets, occasional beer in the evening ?-Current exercise habits: walks daily with his wife ?-Reports hypotensive/hypertensive symptoms - some dizziness/cloudiness recently, this morning had an episode ?-Educated on  Importance of home blood pressure monitoring; ?Symptoms of hypotension and importance of maintaining adequate hydration; ?-Counseled to monitor BP at home when symptomatic, document, and provide log at future appointments ?-Recommended continue current management ?Check BP when he feels dizzy, careful when switching from sitting to standing ? ?Hyperlipidemia: (LDL goal < 100) ?-Controlled ?-Current treatment: ?Rosuvastatin '5mg'$  one-half tablet daily Appropriate, Effective, Safe, Accessible ?-Medications previously tried: none noted  ?-Current dietary patterns: see HTN ?-Current exercise habits: see HTN ?-Educated on Cholesterol goals;  ?Benefits of statin for ASCVD risk reduction; ?Importance of limiting foods high in cholesterol; ?Most recent LDL is excellent, no adverse effects from statin ?-Recommended to continue current medication ? ?Update 09/30/21 ?Continues to take with no adverse effects.  Most recent LDL well controlled at 71. ?No changes at this time. ?Continue current meds. ? ?Allergic Rhinitis (Goal: Minimize symptoms) ?-Controlled ?-Current treatment  ?Fluticasone 53mg prn ?-Medications previously tried: none noted ?-Uses prn  ?-Recommended to continue current medication ? ?Neurocognitive disorder (Goal: Minimize symptoms) ?-Controlled ?-Current treatment  ?Donepezil '10mg'$  daily Appropriate, Effective, Safe, Accessible ?Memantine '10mg'$  daily Appropriate, Effective, Safe, Accessible ?Risperidone 0.'25mg'$  twice daily Appropriate, Effective, Safe, Accessible ?Zoloft '25mg'$  daily Appropriate, Effective, Safe, Accessible ?-Medications previously tried: citalopram, seroquel  ?-He does experience some "cloudiness" and dizziness occasionally.  Wife states that his anxiety and mental state have been the best in a while since he started the risperidone.  Counseled on adverse effects subsiding over time.  Did have episode of grogginess today but was still able to drive to appointment.   ?-Recommended to continue  current medication ?If cloudiness does not resolve over time contact providers.  Do not drive when experiencing this feeling. ? ?Update 09/30/21 ?Patient doing about the same  overall.  Periods of brain for.  Pronounced one today as he was having a good morning and had a brief period of brain fog after wife got home.  Reports he gets feelings of anger that will come over him and he cannot shake these.  Denies nightmares.  He is going to get testing for Parkinson's disease done later this month. ?Discussed possibility of increasing dose of Zoloft.  They plan to speak to doctor about this later when they go for a visit. ? ?Patient Goals/Self-Care Activities ?Patient will:  ?- take medications as prescribed as evidenced by patient report and record review ?focus on medication adherence by pill counts ?check blood pressure periodically when symptomatic, document, and provide at future appointments ? ?Follow Up Plan: The care management team will reach out to the patient again over the next 180 days.  ? ? ?  ? ?  ?  ? ?The patient verbalized understanding of instructions, educational materials, and care plan provided today and declined offer to receive copy of patient instructions, educational materials, and care plan.  ?Telephone follow up appointment with pharmacy team member scheduled for: 4 months ? ?Edythe Clarity, [facility]  ?Beverly Milch, PharmD, CPP ?Clinical Pharmacist Practitioner ?[place] ?[phone] ? ?

## 2021-10-03 ENCOUNTER — Encounter (HOSPITAL_COMMUNITY)
Admission: RE | Admit: 2021-10-03 | Discharge: 2021-10-03 | Disposition: A | Discharge status: Home / Self Care | Payer: Medicare HMO | Source: Ambulatory Visit | Attending: Neurology | Admitting: Neurology

## 2021-10-03 DIAGNOSIS — R413 Other amnesia: Secondary | ICD-10-CM | POA: Diagnosis not present

## 2021-10-03 DIAGNOSIS — G20 Parkinson's disease: Secondary | ICD-10-CM | POA: Insufficient documentation

## 2021-10-03 DIAGNOSIS — G3184 Mild cognitive impairment, so stated: Secondary | ICD-10-CM | POA: Insufficient documentation

## 2021-10-03 DIAGNOSIS — G4752 REM sleep behavior disorder: Secondary | ICD-10-CM | POA: Insufficient documentation

## 2021-10-03 MED ORDER — POTASSIUM IODIDE (ANTIDOTE) 130 MG PO TABS
ORAL_TABLET | ORAL | Status: AC
  Filled 2021-10-03: qty 1

## 2021-10-03 MED ORDER — IOFLUPANE I 123 185 MBQ/2.5ML IV SOLN
4.3000 | Freq: Once | INTRAVENOUS | Status: AC | PRN
  Administered 2021-10-03: 4.3 via INTRAVENOUS
  Filled 2021-10-03: qty 5

## 2021-10-05 DIAGNOSIS — E785 Hyperlipidemia, unspecified: Secondary | ICD-10-CM

## 2021-10-13 ENCOUNTER — Telehealth: Payer: Self-pay | Admitting: Neurology

## 2021-10-13 DIAGNOSIS — F419 Anxiety disorder, unspecified: Secondary | ICD-10-CM | POA: Diagnosis not present

## 2021-10-13 DIAGNOSIS — G3184 Mild cognitive impairment, so stated: Secondary | ICD-10-CM | POA: Diagnosis not present

## 2021-10-13 DIAGNOSIS — F321 Major depressive disorder, single episode, moderate: Secondary | ICD-10-CM | POA: Diagnosis not present

## 2021-10-13 NOTE — Telephone Encounter (Signed)
Please call patient, DaTSCAN showed symmetric decreased activity bilaterally, this pattern can be seen in parkinsonism syndrome ? ? ?Keep current medications ? ?Symmetric decreased striatal Ioflupane activity as above. This ?pattern can be seen in Parkinsonian syndromes. ?  ?Of note, DaTSCAN is not diagnostic of Parkinsonian syndromes, which ?remains a clinical diagnosis. DaTscan is an adjuvant test to aid in ?the clinical diagnosis of Parkinsonian syndromes. ?

## 2021-10-13 NOTE — Telephone Encounter (Signed)
I called patient. I spoke with patient's wife, Caleb Gibson, per DPR. I discussed his DaT scan results with her. Patient will continue current medication regimen and let us know if he needs to be seen sooner than October. Pt verbalized understanding of results. Pt's wife had no questions at this time but was encouraged to call back if questions arise. ? ?

## 2021-10-14 DIAGNOSIS — F321 Major depressive disorder, single episode, moderate: Secondary | ICD-10-CM | POA: Diagnosis not present

## 2021-10-14 DIAGNOSIS — F419 Anxiety disorder, unspecified: Secondary | ICD-10-CM | POA: Diagnosis not present

## 2021-10-20 DIAGNOSIS — H40013 Open angle with borderline findings, low risk, bilateral: Secondary | ICD-10-CM | POA: Diagnosis not present

## 2021-10-20 DIAGNOSIS — Z961 Presence of intraocular lens: Secondary | ICD-10-CM | POA: Diagnosis not present

## 2021-10-20 DIAGNOSIS — H18513 Endothelial corneal dystrophy, bilateral: Secondary | ICD-10-CM | POA: Diagnosis not present

## 2021-10-20 DIAGNOSIS — H538 Other visual disturbances: Secondary | ICD-10-CM | POA: Diagnosis not present

## 2021-10-20 DIAGNOSIS — H31091 Other chorioretinal scars, right eye: Secondary | ICD-10-CM | POA: Diagnosis not present

## 2021-10-20 DIAGNOSIS — H35412 Lattice degeneration of retina, left eye: Secondary | ICD-10-CM | POA: Diagnosis not present

## 2021-10-30 ENCOUNTER — Telehealth: Payer: Self-pay | Admitting: Family Medicine

## 2021-10-30 NOTE — Telephone Encounter (Signed)
Patients wife called, wants to know reason why a referral was placed for a colonoscopy- stated patient is 81 years old,- is asking : is this really necessary?

## 2021-10-30 NOTE — Telephone Encounter (Addendum)
LVM for pt to let her know the reason for the referral was bc back in 2018 the pt was Dx with tubular adenoma and it was recommended for repeat in 5 years to see if problem has been resolved or further actions needed to be taken.  Spoke with pt to let her know the reason why the referral was placed. According to the referral back in 2018 the pt was dx with tubular adenoma and it was for a repeat in 5 years.

## 2021-10-31 DIAGNOSIS — H31091 Other chorioretinal scars, right eye: Secondary | ICD-10-CM | POA: Diagnosis not present

## 2021-10-31 DIAGNOSIS — H35361 Drusen (degenerative) of macula, right eye: Secondary | ICD-10-CM | POA: Diagnosis not present

## 2021-10-31 DIAGNOSIS — H35431 Paving stone degeneration of retina, right eye: Secondary | ICD-10-CM | POA: Diagnosis not present

## 2021-10-31 DIAGNOSIS — H353121 Nonexudative age-related macular degeneration, left eye, early dry stage: Secondary | ICD-10-CM | POA: Diagnosis not present

## 2021-11-04 ENCOUNTER — Telehealth: Payer: Medicare HMO

## 2021-11-12 DIAGNOSIS — F321 Major depressive disorder, single episode, moderate: Secondary | ICD-10-CM | POA: Diagnosis not present

## 2021-11-12 DIAGNOSIS — F419 Anxiety disorder, unspecified: Secondary | ICD-10-CM | POA: Diagnosis not present

## 2021-11-13 ENCOUNTER — Encounter: Payer: Self-pay | Admitting: Family Medicine

## 2021-11-13 DIAGNOSIS — F419 Anxiety disorder, unspecified: Secondary | ICD-10-CM | POA: Diagnosis not present

## 2021-11-13 DIAGNOSIS — G3184 Mild cognitive impairment, so stated: Secondary | ICD-10-CM | POA: Diagnosis not present

## 2021-11-13 DIAGNOSIS — F321 Major depressive disorder, single episode, moderate: Secondary | ICD-10-CM | POA: Diagnosis not present

## 2021-11-21 ENCOUNTER — Telehealth: Payer: Self-pay | Admitting: Gastroenterology

## 2021-11-21 NOTE — Telephone Encounter (Signed)
LVM to advise patient and wife.

## 2021-11-21 NOTE — Telephone Encounter (Signed)
It appears he had a few adenomas as well as a traditional serrated adenoma removed in 08/2016. Normally this would be followed up with a colonoscopy in 3 years. However, he is 81 years old, an age at which many patients stop having routine surveillance colonoscopy. I recommend an office visit to discuss this further and see how he wants to proceed. Thanks

## 2021-11-21 NOTE — Telephone Encounter (Signed)
Good Afternoon Dr. Havery Moros,  D.O.D  3/1 AM   Patients wife called stating that  patient has a referral in to have another colonoscopy due to having tubular adenoma. Patient had his last colonoscopy 09/01/16 with [facility]. Patients wife stated that he is not having any issues and is wanting to know if he even needs to have the procedure. Patients records from last procedure are in Epic, will you please review and advise on scheduling?  Thank you.

## 2021-11-24 DIAGNOSIS — F321 Major depressive disorder, single episode, moderate: Secondary | ICD-10-CM | POA: Diagnosis not present

## 2021-11-24 DIAGNOSIS — F419 Anxiety disorder, unspecified: Secondary | ICD-10-CM | POA: Diagnosis not present

## 2021-11-25 ENCOUNTER — Encounter: Payer: Self-pay | Admitting: Gastroenterology

## 2021-11-25 NOTE — Telephone Encounter (Signed)
Patient has been scheduled for 01/07/22.

## 2021-12-01 ENCOUNTER — Telehealth: Payer: Self-pay | Admitting: Pharmacist

## 2021-12-01 NOTE — Progress Notes (Signed)
Chronic Care Management Pharmacy Assistant   Name: Caleb Gibson  MRN: [id number] DOB: [birth year]   Reason for Encounter: General Adherence Call    Recent office visits:  None since last CPP visit  Recent consult visits:  None since last CPP visit  Hospital visits:  None in previous 6 months  Medications: Outpatient Encounter Medications as of 12/01/2021  Medication Sig   ALPRAZolam (XANAX) 0.5 MG tablet Take 1 tablet (0.5 mg total) by mouth daily as needed for anxiety.   cholecalciferol (VITAMIN D) 1000 units tablet Take 1,000 Units by mouth daily.    donepezil (ARICEPT) 10 MG tablet TAKE 1 TABLET EVERY DAY   Fluticasone Propionate (FLONASE NA) Place into the nose.   memantine (NAMENDA) 10 MG tablet TAKE 1 TABLET TWICE DAILY   Multiple Vitamins-Minerals (CENTRUM SILVER) tablet Take 1 tablet by mouth daily.    risperiDONE (RISPERDAL) 0.5 MG tablet Take 0.25 mg by mouth daily. Take 2 as needed daily for anxiety   rosuvastatin (CRESTOR) 5 MG tablet TAKE 1/2 TABLET EVERY DAY   sertraline (ZOLOFT) 25 MG tablet Take 25 mg by mouth daily.   tamsulosin (FLOMAX) 0.4 MG CAPS capsule Take 0.4 mg by mouth.   No facility-administered encounter medications on file as of 12/01/2021.   Contacted Caleb Gibson for General Review Call   Chart Review:  Have there been any documented new, changed, or discontinued medications since last visit? No Has there been any documented recent hospitalizations or ED visits since last visit with Clinical Pharmacist? No   Adherence Review:  Does the Clinical Pharmacist Assistant have access to adherence rates? Yes Adherence rates for STAR metric medications: Rosuvastatin 5 mg last filled 10/20/2021 Does the patient have >5 day gap between last estimated fill dates for any of the above medications or other medication gaps? No Reason for medication gaps.   Disease State Questions:  Able to connect with Patient? Yes Did patient have any problems with  their health recently? No Have you had any admissions or emergency room visits or worsening of your condition(s) since last visit? No Have you had any visits with new specialists or providers since your last visit? No Have you had any new health care problem(s) since your last visit? Noy Have you run out of any of your medications since you last spoke with clinical pharmacist? No Are there any medications you are not taking as prescribed? No Are you having any issues or side effects with your medications? No Do you have any other health concerns or questions you want to discuss with your Clinical Pharmacist before your next visit? No Are there any health concerns that you feel we can do a better job addressing? No Are you having any problems with any of the following since the last visit: (select all that apply)  None 12. Any falls since last visit? No 13. Any increased or uncontrolled pain since last visit? No  -Patient states he is doing well. He said his bouts of anger have improved some.  Care Gaps: Medicare Annual Wellness: Overdue last AWV 10/05/2019 - declines to schedule Hemoglobin A1C: none available Colonoscopy: Completed  Future Appointments  Date Time Provider Department Center  12/12/2021  1:00 PM Rosann Auerbach, PhD [facility] None  12/12/2021  2:00 PM [facility] None  12/19/2021 10:00 AM Rosann Auerbach, PhD [facility] None  01/07/2022  2:30 PM Armbruster, Willaim Rayas, MD [facility]  02/06/2022 10:30 AM Willow Ora, MD [facility]  03/16/2022  10:00 AM Levert Feinstein, MD [facility] None    Star Rating Drugs: Rosuvastatin 5 mg last filled 10/20/2021  April D Calhoun, [facility] Clinical Pharmacist Assistant [phone]

## 2021-12-04 DIAGNOSIS — D225 Melanocytic nevi of trunk: Secondary | ICD-10-CM | POA: Diagnosis not present

## 2021-12-04 DIAGNOSIS — Z85828 Personal history of other malignant neoplasm of skin: Secondary | ICD-10-CM | POA: Diagnosis not present

## 2021-12-04 DIAGNOSIS — L570 Actinic keratosis: Secondary | ICD-10-CM | POA: Diagnosis not present

## 2021-12-11 ENCOUNTER — Encounter: Payer: Medicare HMO | Admitting: Psychology

## 2021-12-11 DIAGNOSIS — F321 Major depressive disorder, single episode, moderate: Secondary | ICD-10-CM | POA: Diagnosis not present

## 2021-12-11 DIAGNOSIS — F419 Anxiety disorder, unspecified: Secondary | ICD-10-CM | POA: Diagnosis not present

## 2021-12-12 ENCOUNTER — Ambulatory Visit: Payer: Medicare HMO | Admitting: Psychology

## 2021-12-12 ENCOUNTER — Encounter: Payer: Self-pay | Admitting: Psychology

## 2021-12-12 DIAGNOSIS — G20 Parkinson's disease: Secondary | ICD-10-CM | POA: Diagnosis not present

## 2021-12-12 DIAGNOSIS — G3184 Mild cognitive impairment, so stated: Secondary | ICD-10-CM | POA: Diagnosis not present

## 2021-12-12 DIAGNOSIS — R4189 Other symptoms and signs involving cognitive functions and awareness: Secondary | ICD-10-CM

## 2021-12-12 NOTE — Progress Notes (Unsigned)
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 History: Depression: While Caleb Gibson. Cullers stated that he would not call himself depressed, he has not been "Caleb Gibson. Happy [place] Cerise" lately. He previously reported that since his illness and subsequent decline in functioning, things have been "not easy" lately. His wife previously described Caleb Gibson. Fedie as somewhat pessimistic in nature, said to be longstanding. Gibson or remote suicidal ideation, intent, or plan was denied.  Anxiety: Caleb Gibson. Hodgkin previously acknowledged longstanding symptoms of generalized anxiety. As stated above, these were exacerbated and included symptoms of panic surrounding his prior illness. Medical records do suggest a diagnosis of generalized anxiety disorder. He did not report any medication intervention prior to Seroquel. He does take Xanax; however, this appears to help with sleep rather than anxiety.  Mania: Denied. Trauma History: Denied. Visual/auditory hallucinations: See above.  Delusional thoughts: Denied.   Tobacco: Denied. Alcohol: He reported consuming 2-5 beers per week currently. He did acknowledge a remote history of increased alcohol consumption which caused problems (e.g., DUI). His wife stated that if he had stayed with his ex-wife, he would likely be a "full blown alcoholic."  Recreational drugs:  Denied.  Family History: Problem Relation Age of Onset   Alcohol abuse Mother    Melanoma Mother    Lymphoma Mother    Mental illness Mother    Prostate cancer Father    Diabetes Father    Glaucoma Father    Heart disease Maternal Grandmother    Alcohol abuse Maternal Aunt    Cirrhosis Maternal Aunt    This information was confirmed by Caleb Gibson. Tsou.  Academic/Vocational History: Highest level of educational attainment: 14 years. He graduated from high school and completed two additional years of college. He noted strong performance in early academic settings, stating that the school he attended was not academically rigorous. After moving to [facility] and attending a new school, he reported quickly finding himself very behind on the curriculum and struggled during the remainder of high school. Chemistry was reported as a relative weakness.  History of developmental delay: Denied. History of grade repetition: Denied. Enrollment in special education courses: Denied. History of LD/ADHD: Denied.   Employment: Retired. Caleb Gibson. Keisler spent 3.5 years in the [employer]. 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   Evaluation Results:   Behavioral Observations: Caleb Gibson. Cruces was accompanied by his wife, arrived to his appointment on time, and was appropriately dressed and groomed. He appeared alert and oriented. Observed gait and station were within normal limits. Gross motor functioning appeared intact upon informal observation and no abnormal movements (e.g., tremors) were noted. His  affect was generally relaxed and positive. Spontaneous speech was fluent and word finding difficulties were not observed during the clinical interview. Thought processes were coherent, organized, and normal in content. Insight into his cognitive difficulties appeared adequate.   During testing, sustained attention was appropriate. Task engagement was adequate and he persisted when challenged. He was  noted to fatigue as the evaluation progressed and two tasks were ultimately not performed due to this experience. Overall, Caleb Gibson. Coker was cooperative with the clinical interview and subsequent testing procedures.   Adequacy of Effort: The validity of neuropsychological testing is limited by the extent to which the individual being tested may be assumed to have exerted adequate effort during testing. Caleb Gibson. Krise expressed his intention to perform to the best of his abilities and exhibited adequate task engagement and persistence. Scores across stand-alone and embedded performance validity measures were within expectation. As such, the results of the Gibson evaluation are believed to be a valid representation of Caleb Gibson. Cleckler Gibson cognitive functioning.  Test Results: Caleb Gibson. Deller was largely oriented at the time of the Gibson evaluation. He lost a single point for incorrectly stating his age ("33").  Intellectual abilities based upon educational and vocational attainment were estimated to be in the average range. Premorbid abilities were estimated to be within the average range based upon a single-word reading test.   Processing speed was variable, ranging from the exceptionally low to below average normative ranges. Basic attention was above average to well above average. More complex attention (e.g., working memory) was well below average to below average. Assessed executive functioning was exceptionally low to well below average. Two tasks within this domain were unable to be assessed given increasing fatigue.  Assessed receptive language abilities were above average. Likewise, Caleb Gibson. Koppelman did not exhibit any difficulties comprehending task instructions and answered all questions asked of him appropriately. Assessed expressive language was variable. Phonemic fluency was average, semantic fluency was well below average, and confrontation naming was above average.      Assessed visuospatial/visuoconstructional  abilities were variable, ranging from the exceptionally low to average normative ranges. He made two attempts to draw the face of a clock. During each attempt, he was able to place less than half of the required numbers. He was also unable to place the clock hands each time. Points were lost on his copy of a complex figure due to mild visual distortions.     Learning (i.e., encoding) of novel verbal information was exceptionally low to well below average. Spontaneous delayed recall (i.e., retrieval) of previously learned information was average across a visual task but exceptionally low to well below average across verbal tasks. Retention rates were 75% (raw score of 3) across a story learning task, 0% across a list learning task, and 100% across a figure drawing task. Performance across recognition tasks was exceptionally low across a list learning task but average across figure and story tasks, suggesting some evidence for information consolidation.   Results of emotional screening instruments suggested that recent symptoms of generalized anxiety were in the minimal range, while symptoms of depression were within normal limits. A screening instrument assessing recent sleep quality suggested the presence of minimal sleep dysfunction.  Tables of Scores:   Note: This summary of test scores accompanies the interpretive report and should not be considered in isolation without reference to the appropriate sections in the text. Descriptors are based on appropriate normative data and may be adjusted based on clinical judgment. Terms such as "Within Normal Limits" and "  Outside Normal Limits" are used when a more specific description of the test score cannot be determined. Descriptors refer to the Gibson evaluation only.        Percentile - Normative Descriptor > 98 - Exceptionally High 91-97 - Well Above Average 75-90 - Above Average 25-74 - Average 9-24 - Below Average 2-8 - Well Below Average < 2 -  Exceptionally Low        Validity:    DESCRIPTOR   July 2022 Gibson    ACS Word Choice: --- --- --- Within Normal Limits  Dot Counting Test: --- --- --- Within Normal Limits  RBANS Effort Index: --- --- --- Within Normal Limits  WAIS-IV Reliable Digit Span: --- --- --- Within Normal Limits        Orientation:       Raw Score Raw Score Percentile   NAB Orientation, Form 1 28/29 28/29 --- ---        Cognitive Screening:       Raw Score Raw Score Percentile   SLUMS: 16/30 16/30 --- ---        RBANS, Form A: Standard Score/ Scaled Score Standard Score/ Scaled Score Percentile   Total Score 80 74 4 Well Below Average  Immediate Memory 81 61 <1 Exceptionally Low    List Learning $RemoveBefor'8 3 1 'eNNqTTItlecz$ Exceptionally Low    Story Memory $RemoveBefo'5 4 2 'RfbspmGArej$ Well Below Average  Visuospatial/Constructional 81 87 19 Below Average    Figure Copy $RemoveBef'8 6 9 'NdGVREOJNL$ Below Average    Line Orientation 12/20 16/20 26-50 Average  Language 92 86 18 Below Average    Picture Naming 10/10 10/10 >75 Above Average    Semantic Fluency $RemoveBeforeDE'6 4 2 'lRJGQNfopMYDRNm$ Well Below Average  Attention 100 91 27 Average    Digit Span 14 14 91 Well Above Average    Coding $Remov'6 3 1 'ySOVmg$ Exceptionally Low  Delayed Memory 71 71 3 Well Below Average    List Recall 1/10 0/10 <2 Exceptionally Low    List Recognition 14/20 15/20 <2 Exceptionally Low    Story Recall $RemoveBefo'8 5 5 'rKoYOWWGpew$ Well Below Average    Story Recognition 9/12 10/12 53-67 Average    Figure Recall 8 9 37 Average    Figure Recognition 2/8 7/8 69-83 Average         Intellectual Functioning:       Standard Score Standard Score Percentile   Test of Premorbid Functioning: 93 94 34 Average        Attention/Executive Function:      Trail Making Test (TMT): Raw Score (Scaled Score) Raw Score (Scaled Score) Percentile     Part A 58 secs.,  0 errors (8) 64 secs.,  0 errors (7) 16 Below Average    Part B 234 secs.,  1 error (6) Discontinued --- Impaired  *Based on Mayo's Older Normative Studies (MOANS)             Scaled Score Scaled  Score Percentile   WAIS-IV Digit Span: $RemoveBef'9 8 25 'lOBvIunMzs$ Average    Forward 14 12 75 Above Average    Backward $RemoveB'8 7 16 'boImEASa$ Below Average    Sequencing $RemoveBef'5 5 5 'RTVCAyTMVv$ Well Below Average        D-KEFS Color-Word Interference Test: Raw Score (Scaled Score) Raw Score (Scaled Score) Percentile     Color Naming 43 secs. (7) 44 secs. (7) 16 Below Average    Word Reading 33 secs. (7) 33 secs. (7) 16 Below Average    Inhibition 138 secs. (5)  155 secs. (2) <1 Exceptionally Low      Total Errors 8 errors (6) 14 errors (2) <1 Exceptionally Low    Inhibition/Switching 126 secs. (7) 137 secs. (5) 5 Well Below Average      Total Errors 3 errors (11) 0 errors (14) 91 Well Above Average        Language:      Verbal Fluency Test: Raw Score (Scaled Score) Raw Score (Scaled Score) Percentile     Phonemic Fluency (CFL) 28 (9) 25 (8) 25 Average    Category Fluency 23 (5) 20 (4) 2 Well Below Average  *Based on Mayo's Older Normative Studies (MOANS)            NAB Language Module, Form 1: T Score T Score Percentile     Auditory Comprehension 58 58 79 Above Average    Naming 30/31 (62) 30/31 (62) 88 Above Average        Visuospatial/Visuoconstruction:       Raw Score Raw Score Percentile   Clock Drawing: 8/10 3/10 --- Impaired        NAB Spatial Module, Form 1: T Score T Score Percentile     Visual Discrimination --- 36 8 Well Below Average         Scaled Score Scaled Score Percentile   WAIS-IV Block Design: $RemoveBefor'10 8 25 'NYGdxLkDBXou$ Average  WAIS-IV Visual Puzzles: --- 7 16 Below Average        Mood and Personality:       Raw Score Raw Score Percentile   Geriatric Depression Scale: 13 8 --- Within Normal Limits  Geriatric Anxiety Scale: 24 6 --- Minimal    Somatic 6 1 --- Minimal    Cognitive 10 2 --- Minimal    Affective 8 3 --- Minimal        Additional Questionnaires:       Raw Score Raw Score Percentile   PROMIS Sleep Disturbance Questionnaire: 9 15 --- None to Slight   Informed Consent and Coding/Compliance:   The Gibson  evaluation represents a clinical evaluation for the purposes previously outlined by the referral source and is in no way reflective of a forensic evaluation.   Caleb Gibson. Osten was provided with a verbal description of the nature and purpose of the present neuropsychological evaluation. Also reviewed were the foreseeable risks and/or discomforts and benefits of the procedure, limits of confidentiality, and mandatory reporting requirements of this provider. The patient was given the opportunity to ask questions and receive answers about the evaluation. Oral consent to participate was provided by the patient.   This evaluation was conducted by Christia Reading, Ph.D., ABPP-CN, board certified clinical neuropsychologist. Caleb Gibson. Koller completed a clinical interview with Dr. Melvyn Novas, billed as one unit [phone], and 130 minutes of cognitive testing and scoring, billed as one unit [phone] and three additional units [id number]. Psychometrist Milana Kidney, B.S., assisted Dr. Melvyn Novas with test administration and scoring procedures. As a separate and discrete service, Dr. Melvyn Novas spent a total of 160 minutes in interpretation and report writing billed as one unit [phone] and two units 96133.

## 2021-12-12 NOTE — Progress Notes (Signed)
   Psychometrician Note   Cognitive testing was administered to Caleb Gibson by Caleb Gibson, B.S. (psychometrist) under the supervision of Caleb Gibson, Ph.D., licensed psychologist on 12/12/2021. Caleb Gibson did not appear overtly distressed by the testing session per behavioral observation or responses across self-report questionnaires. Rest breaks were offered.    The battery of tests administered was selected by Caleb Gibson, Ph.D. with consideration to Caleb Gibson's current level of functioning, the nature of his symptoms, emotional and behavioral responses during interview, level of literacy, observed level of motivation/effort, and the nature of the referral question. This battery was communicated to the psychometrist. Communication between Caleb Gibson, Ph.D. and the psychometrist was ongoing throughout the evaluation and Caleb Gibson, Ph.D. was immediately accessible at all times. Caleb Gibson, Ph.D. provided supervision to the psychometrist on the date of this service to the extent necessary to assure the quality of all services provided.    Caleb Gibson will return within approximately 1-2 weeks for an interactive feedback session with Caleb Gibson at which time his test performances, clinical impressions, and treatment recommendations will be reviewed in detail. Caleb Gibson understands he can contact our office should he require our assistance before this time.  A total of 130 minutes of billable time were spent face-to-face with Caleb Gibson by the psychometrist. This includes both test administration and scoring time. Billing for these services is reflected in the clinical report generated by Caleb Gibson, Ph.D.  This note reflects time spent with the psychometrician and does not include test scores or any clinical interpretations made by Caleb Gibson. The full report will follow in a separate note.

## 2021-12-18 ENCOUNTER — Encounter: Payer: Medicare HMO | Admitting: Psychology

## 2021-12-19 ENCOUNTER — Ambulatory Visit: Payer: Medicare HMO | Admitting: Psychology

## 2021-12-19 DIAGNOSIS — G20 Parkinson's disease: Secondary | ICD-10-CM

## 2021-12-19 DIAGNOSIS — G3184 Mild cognitive impairment, so stated: Secondary | ICD-10-CM

## 2021-12-19 DIAGNOSIS — R4189 Other symptoms and signs involving cognitive functions and awareness: Secondary | ICD-10-CM

## 2021-12-19 NOTE — Progress Notes (Signed)
   Neuropsychology Feedback Session Caleb Gibson. [facility] of Neurology  Reason for Referral:   Caleb Gibson is a 81 y.o. right-handed Caucasian male referred by  Butler Denmark, NP , to characterize his current cognitive functioning and assist with diagnostic clarity and treatment planning in the context of subjective cognitive decline.   Feedback:   Caleb Gibson completed a comprehensive neuropsychological evaluation on 12/12/2021. Please refer to that encounter for the full report and recommendations. Briefly, results suggested primary impairments surrounding executive functioning, semantic fluency, and encoding (i.e., learning) aspects of memory. Further performance variability was exhibited across processing speed, complex attention, visuospatial abilities, and delayed retrieval/recognition aspects of memory. Relative to his previous evaluation in July 2022, his most prominent declines were seen across executive functioning, clock drawing, and encoding (i.e., learning) aspects of verbal memory. More subtle decline was exhibited across spontaneous recall of story-based information. Given the results of Caleb Gibson completed DaTscan this past May, I have primary concerns for something along the parkinsonian spectrum. Interestingly, despite the results of this scan, Caleb Gibson did not describe prominent tremors or balance instability. His wife did report decreased arm swing and him leaning forward while ambulating. He also reported symptoms of micrographia. Patterns across testing are certainly not inconsistent with Parkinson's disease and this remains plausible. His DaT scan revealed symmetric reduced radiotracer uptake with involvement of the caudate, which can sometimes be an indication of an atypical parkinsonian presentation. Of these, Lewy body disease could be plausible given his wife's report of fluctuations in alertness, combined with a history of REM sleep behaviors and visual  hallucinations.  Caleb Gibson was accompanied by his wife during the current feedback session. Content of the current session focused on the results of his neuropsychological evaluation. Caleb Gibson was given the opportunity to ask questions and his questions were answered. He was encouraged to reach out should additional questions arise. A copy of his report was provided at the conclusion of the visit.      40 minutes were spent conducting the current feedback session with Caleb Gibson, billed as one unit [phone].

## 2021-12-22 DIAGNOSIS — H538 Other visual disturbances: Secondary | ICD-10-CM | POA: Diagnosis not present

## 2021-12-22 DIAGNOSIS — Z961 Presence of intraocular lens: Secondary | ICD-10-CM | POA: Diagnosis not present

## 2021-12-22 DIAGNOSIS — H353131 Nonexudative age-related macular degeneration, bilateral, early dry stage: Secondary | ICD-10-CM | POA: Diagnosis not present

## 2021-12-22 DIAGNOSIS — H35412 Lattice degeneration of retina, left eye: Secondary | ICD-10-CM | POA: Diagnosis not present

## 2021-12-22 DIAGNOSIS — H18513 Endothelial corneal dystrophy, bilateral: Secondary | ICD-10-CM | POA: Diagnosis not present

## 2021-12-22 DIAGNOSIS — H40013 Open angle with borderline findings, low risk, bilateral: Secondary | ICD-10-CM | POA: Diagnosis not present

## 2021-12-24 DIAGNOSIS — F321 Major depressive disorder, single episode, moderate: Secondary | ICD-10-CM | POA: Diagnosis not present

## 2021-12-24 DIAGNOSIS — F419 Anxiety disorder, unspecified: Secondary | ICD-10-CM | POA: Diagnosis not present

## 2021-12-24 DIAGNOSIS — G3184 Mild cognitive impairment, so stated: Secondary | ICD-10-CM | POA: Diagnosis not present

## 2021-12-26 ENCOUNTER — Telehealth: Payer: Self-pay | Admitting: Pharmacist

## 2021-12-26 NOTE — Progress Notes (Unsigned)
Chronic Care Management Pharmacy Assistant   Name: Frances Joynt  MRN: [id number] DOB: 02-23-1941   Reason for Encounter: General Adherence Call    Recent office visits:  None  Recent consult visits:  12/19/2021 [facility] (Neurology) Hazle Coca, PhD; no medication changes indicated.  12/12/2021 [facility] (Neurology) Hazle Coca, PhD; no medication changes indicated.  Hospital visits:  None in previous 6 months  Medications: Outpatient Encounter Medications as of 12/26/2021  Medication Sig   ALPRAZolam (XANAX) 0.5 MG tablet Take 1 tablet (0.5 mg total) by mouth daily as needed for anxiety.   cholecalciferol (VITAMIN D) 1000 units tablet Take 1,000 Units by mouth daily.    donepezil (ARICEPT) 10 MG tablet TAKE 1 TABLET EVERY DAY   Fluticasone Propionate (FLONASE NA) Place into the nose.   memantine (NAMENDA) 10 MG tablet TAKE 1 TABLET TWICE DAILY   Multiple Vitamins-Minerals (CENTRUM SILVER) tablet Take 1 tablet by mouth daily.    risperiDONE (RISPERDAL) 0.5 MG tablet Take 0.25 mg by mouth daily. Take 2 as needed daily for anxiety   rosuvastatin (CRESTOR) 5 MG tablet TAKE 1/2 TABLET EVERY DAY   sertraline (ZOLOFT) 25 MG tablet Take 25 mg by mouth daily.   tamsulosin (FLOMAX) 0.4 MG CAPS capsule Take 0.4 mg by mouth.   No facility-administered encounter medications on file as of 12/26/2021.   Contacted [name] for General Review Call   Chart Review:  Have there been any documented new, changed, or discontinued medications since last visit? No (If yes, include name, dose, frequency, date) Has there been any documented recent hospitalizations or ED visits since last visit with Clinical Pharmacist? No Brief Summary (including medication and/or Diagnosis changes):   Adherence Review:  Does the Clinical Pharmacist Assistant have access to adherence rates? Yes Adherence rates for STAR metric medications: Rosuvastatin 5 mg last filled 11/29/2021 90 DS Does the patient have >5 day  gap between last estimated fill dates for any of the above medications or other medication gaps? No   Disease State Questions:  Able to connect with Patient? Yes Did patient have any problems with their health recently? No Have you had any admissions or emergency room visits or worsening of your condition(s) since last visit? No Have you had any visits with new specialists or providers since your last visit? No Have you had any new health care problem(s) since your last visit? No Have you run out of any of your medications since you last spoke with clinical pharmacist? No Are there any medications you are not taking as prescribed? No Are you having any issues or side effects with your medications? No Do you have any other health concerns or questions you want to discuss with your Clinical Pharmacist before your next visit? No Are there any health concerns that you feel we can do a better job addressing? No Are you having any problems with any of the following since the last visit: (select all that apply)  None 12. Any falls since last visit? No 13. Any increased or uncontrolled pain since last visit? No  -Patient's wife states the patient started taking Methylphenidate a few months ago. She is unsure of the dosage at the time of this call. However, this medication is not on his list.  Care Gaps: Medicare Annual Wellness: Overdue last AWV 10/05/2019 - declines to schedule Hemoglobin A1C: none available Colonoscopy: Completed  Future Appointments  Date Time Provider [place]  01/07/2022  2:30 PM Armbruster, Carlota Raspberry, MD [facility]  02/03/2022  1:30 PM Tat, Eustace Quail, DO [facility] None  02/06/2022 10:30 AM [name], MD [facility]  03/16/2022 10:00 AM Marcial Pacas, MD [facility] None  12/22/2022  1:00 PM Hazle Coca, PhD [facility] None  12/22/2022  2:00 PM [facility]- NEUROPSYCH TECH [facility] None  12/29/2022  2:30 PM Hazle Coca, PhD [facility] None    Star Rating  Drugs: Rosuvastatin 5 mg last filled 11/29/2021 90 DS  April D Calhoun, [place] Pharmacist Assistant [phone]

## 2022-01-05 DIAGNOSIS — F419 Anxiety disorder, unspecified: Secondary | ICD-10-CM | POA: Diagnosis not present

## 2022-01-05 DIAGNOSIS — F321 Major depressive disorder, single episode, moderate: Secondary | ICD-10-CM | POA: Diagnosis not present

## 2022-01-07 ENCOUNTER — Ambulatory Visit: Payer: Medicare HMO | Admitting: Gastroenterology

## 2022-01-07 ENCOUNTER — Encounter: Payer: Self-pay | Admitting: Gastroenterology

## 2022-01-07 VITALS — BP 84/50 | HR 72 | Ht 68.5 in | Wt 209.5 lb

## 2022-01-07 DIAGNOSIS — Z86010 Personal history of colonic polyps: Secondary | ICD-10-CM

## 2022-01-07 DIAGNOSIS — K5900 Constipation, unspecified: Secondary | ICD-10-CM

## 2022-01-07 MED ORDER — POLYETHYLENE GLYCOL 3350 17 G PO PACK: 17.0000 g | PACK | Freq: Every day | ORAL | 0 refills | Status: DC

## 2022-01-07 MED ORDER — NA SULFATE-K SULFATE-MG SULF 17.5-3.13-1.6 GM/177ML PO SOLN: 1.0000 | Freq: Once | ORAL | 0 refills | Status: AC

## 2022-01-07 NOTE — Progress Notes (Signed)
HPI :  81 year old male with a history of colon polyps, prostate cancer status posttreatment with XRT, parkinsonism, hyperlipidemia, referred here by Billey Chang, MD for discussion of surveillance colonoscopy.  He had a colonoscopy performed in March 2018 at an outside center.  A few adenomas were removed as well as a traditional serrated adenoma.  He inquires if he needs another colonoscopy.  At baseline he has some constipation, moves his bowels every 2 to 3 days or so.  He uses a stool softener which can help but has not resolved the issue.  He has a hard time evacuating his stools at times and passes hard stools.  He denies any blood in his stools.  He has never tried MiraLAX or fiber supplement on a routine basis previously.  He denies any abdominal pains.  Weight stable.  No family history of colon cancer.  He denies any cardiopulmonary symptoms at this time and otherwise is in pretty good shape.  He is accompanied by his wife today.  His prostate cancer is in remission.  He is very active and functional, he has no recent hospitalizations.   Past Medical History:  Diagnosis Date   Allergic rhinitis 08/27/2011   Anxiety    Basal cell carcinoma of skin    BPH (benign prostatic hyperplasia) 10/01/2011   Had urology work up; psa normal   Claustrophobia    GAD (generalized anxiety disorder) 08/06/2021   History of prostate cancer 11/22/2017   dxd 11/2017: active surveillance then rads tx 2020   History of traumatic head injury    Idiopathic hypotension 01/07/2016    Normal stress Echo - 12/2014 Neg adrenal insufficiency work up by endocrine 2017  Formatting of this note might be different from the original. Normal stress Echo - 12/2014   Mild neurocognitive disorder 12/19/2020   Mixed hyperlipidemia 09/27/2014   Night terrors    Osteoarthritis, knee 08/01/2010   Parkinsonism 09/09/2021   REM behavioral disorder    Simple cyst of kidney 11/12/2011   Overview:  Stable on CT scan/ no  further imaging or work up needed/cla  Formatting of this note might be different from the original. Stable on CT scan/ no further imaging or work up needed/cla   Tubular adenoma of colon 06/15/2017   Colonoscopy 08/2016; rec repeat in 5 years.      Past Surgical History:  Procedure Laterality Date   APPENDECTOMY     DENTAL SURGERY     Had a tooth pulled in 01/2017   KNEE ARTHROSCOPY WITH MENISCAL REPAIR Right    MOHS SURGERY     RHINOPLASTY     TONSILLECTOMY AND ADENOIDECTOMY     Family History  Problem Relation Age of Onset   Alcohol abuse Mother    Melanoma Mother    Lymphoma Mother    Mental illness Mother    Prostate cancer Father    Diabetes Father    Glaucoma Father    Heart disease Maternal Grandmother    Cancer Maternal Grandmother        type unknown   Cancer Maternal Grandfather        type unknown   Alcohol abuse Maternal Aunt    Cirrhosis Maternal Aunt    Social History   Tobacco Use   Smoking status: Former    Types: Cigarettes    Quit date: 1996    Years since quitting: 27.6   Smokeless tobacco: Never  Vaping Use   Vaping Use: Never used  Substance Use  Topics   Alcohol use: Yes    Alcohol/week: 2.0 - 5.0 standard drinks of alcohol    Types: 2 - 5 Cans of beer per week    Comment: less than 1 per day   Drug use: No   Current Outpatient Medications  Medication Sig Dispense Refill   ALPRAZolam (XANAX) 0.5 MG tablet Take 1 tablet (0.5 mg total) by mouth daily as needed for anxiety. 30 tablet 0   cholecalciferol (VITAMIN D) 1000 units tablet Take 1,000 Units by mouth daily.      donepezil (ARICEPT) 10 MG tablet TAKE 1 TABLET EVERY DAY 90 tablet 4   Fluticasone Propionate (FLONASE NA) Place into the nose.     memantine (NAMENDA) 10 MG tablet TAKE 1 TABLET TWICE DAILY 180 tablet 4   methylphenidate (RITALIN) 5 MG tablet Take 5 mg by mouth every morning.     Multiple Vitamins-Minerals (CENTRUM SILVER) tablet Take 1 tablet by mouth daily.      OPTIVE  0.5-0.9 % ophthalmic solution Place 1 drop into the left eye 2 (two) times daily.     risperiDONE (RISPERDAL) 0.5 MG tablet Take 0.25 mg by mouth daily. Take 2 as needed daily for anxiety     rosuvastatin (CRESTOR) 5 MG tablet TAKE 1/2 TABLET EVERY DAY 45 tablet 3   sertraline (ZOLOFT) 50 MG tablet Take 50 mg by mouth daily.     tamsulosin (FLOMAX) 0.4 MG CAPS capsule Take 0.4 mg by mouth.     No current facility-administered medications for this visit.   Allergies  Allergen Reactions   Viagra [Sildenafil Citrate] Other (See Comments)    Hallucinations      Review of Systems: All systems reviewed and negative except where noted in HPI.   Lab Results  Component Value Date   WBC 4.1 04/04/2021   HGB 13.1 04/04/2021   HCT 38.6 (L) 04/04/2021   MCV 93.1 04/04/2021   PLT 208.0 04/04/2021    Lab Results  Component Value Date   CREATININE 1.04 08/06/2021   BUN 17 08/06/2021   NA 138 08/06/2021   K 4.5 08/06/2021   CL 105 08/06/2021   CO2 27 08/06/2021    Lab Results  Component Value Date   ALT 11 08/06/2021   AST 12 08/06/2021   ALKPHOS 64 08/06/2021   BILITOT 0.6 08/06/2021     Physical Exam: BP (!) 84/50 (BP Location: Left Arm, Patient Position: Sitting, Cuff Size: Normal)   Pulse 72   Ht 5' 8.5" (1.74 m) Comment: height measured without shoes  Wt 209 lb 8 oz (95 kg)   BMI 31.39 kg/m  Constitutional: Pleasant,well-developed, male in no acute distress. HEENT: Normocephalic and atraumatic. Conjunctivae are normal. No scleral icterus. Neck supple.  Cardiovascular: Normal rate, regular rhythm.  Pulmonary/chest: Effort normal and breath sounds normal.  Abdominal: Soft, nondistended, nontender. There are no masses palpable. No hepatomegaly. Extremities: no edema Lymphadenopathy: No cervical adenopathy noted. Neurological: Alert and oriented to person place and time. Skin: Skin is warm and dry. No rashes noted. Psychiatric: Normal mood and affect. Behavior is  normal.   ASSESSMENT: 81 y.o. male here for assessment of the following  1. History of colon polyps   2. Constipation, unspecified constipation type    Patient has some baseline constipation which we discussed as above.  Stool softeners over-the-counter are not providing enough relief based on description of symptoms.  Would recommend MiraLAX daily and titrate up as needed to goal bowel movement once daily.  He will try this over-the-counter.  Otherwise we discussed at his age if he wants to do any further colonoscopy exams for surveillance.  He had a traditional serrated adenoma removed on his last exam in 2018, normally this would be followed up 3 years later, so he is a bit overdue for his surveillance.  We discussed in general without any polyps or strong family history or symptoms we often stop colonoscopy at age 56.  With a history of polyps it depends on many factors on when we stop further surveillance.  He feels as though he is strong enough to get through another exam, otherwise feels like he is doing well and in good health.  We discussed risks of doing further colonoscopy versus risks of stopping and discussed if he wanted to proceed with one last exam or not.  After discussion of risk benefits of colonoscopy and anesthesia, he wants to do 1 final colonoscopy to survey his colon and assuming no high risk lesions, this would be his last.  PLAN: - lengthy discussion as above, patient wishes to schedule colonoscopy in the [place] in September - recommend MiraLAX daily and titrate up or down as needed for treatment of chronic constipation in the interim  Jolly Mango, MD [facility]  CC: [name], MD

## 2022-01-07 NOTE — Patient Instructions (Addendum)
_______________________________________________________  If you are age 82 or older, your body mass index should be between 23-30. Your Body mass index is 31.39 kg/m. If this is out of the aforementioned range listed, please consider follow up with your Primary Care Provider.  If you are age 79 or younger, your body mass index should be between 19-25. Your Body mass index is 31.39 kg/m. If this is out of the aformentioned range listed, please consider follow up with your Primary Care Provider.   ________________________________________________________  The [facility] providers would like to encourage you to use [facility] to communicate with providers for non-urgent requests or questions.  Due to long hold times on the telephone, sending your provider a message by [facility] may be a faster and more efficient way to get a response.  Please allow 48 business hours for a response.  Please remember that this is for non-urgent requests.  _______________________________________________________  Caleb Gibson have been scheduled for a colonoscopy. Please follow written instructions given to you at your visit today.  Please pick up your prep supplies at the pharmacy within the next 1-3 days. If you use inhalers (even only as needed), please bring them with you on the day of your procedure.  We will request your colonoscopy records from [facility] from 2018.  Please purchase the following medications over the counter and take as directed: Miralax- Take as directed daily (we are giving you a coupon and samples today)  Thank you for entrusting me with your care and for choosing [facility], Dr. Prichard Cellar

## 2022-01-12 ENCOUNTER — Telehealth: Payer: Self-pay | Admitting: Gastroenterology

## 2022-01-12 NOTE — Telephone Encounter (Signed)
PT wife is wanting to know if Caleb Gibson would be a candidate for the cologuard instead of doing the actual procedure. Please reach out to advise. Thank you

## 2022-01-12 NOTE — Telephone Encounter (Signed)
Returned call to patient's wife. I left a detailed vm on the home phone. I told them that cologuard is for low risk patients. I informed them that unfortunately pt is not a candidate for cologuard due to his history of colon polyps. I informed them that cologuard could come back positive and patient would still need to have a colonoscopy.  I advised that pt keep his colonoscopy as scheduled for September. I advised pt or his wife to call back if they had any further questions or concerns.

## 2022-01-22 ENCOUNTER — Telehealth: Payer: Self-pay | Admitting: Gastroenterology

## 2022-01-22 NOTE — Telephone Encounter (Signed)
Inbound call from patients wife stating that she went to pick up Suprep for patient and insurance will not cover it. Patients wife is requesting a different prep to be sent to pharmacy. And is also requesting a call to be advised when medication has been sent. Please advise.

## 2022-01-23 MED ORDER — NA SULFATE-K SULFATE-MG SULF 17.5-3.13-1.6 GM/177ML PO SOLN: 1.0000 | Freq: Once | ORAL | 0 refills | Status: AC

## 2022-01-23 NOTE — Telephone Encounter (Signed)
RX sent to [facility] in [place]. Pt's wife notified.

## 2022-01-23 NOTE — Telephone Encounter (Signed)
Patient's wife called.  She stated in order to get the prep for $29 with the [facility] it would have to be called into [facility].  She asked if you could please call it into the [facility] at [facility]  She also asked that you call and let her know once it had been called in.  Thank you.

## 2022-01-23 NOTE — Telephone Encounter (Signed)
Returned call to patient's wife. I offered to provide them with a [place] coupon that could be picked up at the office. Pt's wife states that she can access it on her phone. I advised that she can search [facility] on [address] and will need to take it to the pharmacy. They should be able to pick up the prep for $29. Pt's wife verbalized understanding and had no concerns at the end of the call.

## 2022-01-23 NOTE — Addendum Note (Signed)
Addended by: Yevette Edwards on: 01/23/2022 10:42 AM   Modules accepted: Orders

## 2022-01-28 DIAGNOSIS — C61 Malignant neoplasm of prostate: Secondary | ICD-10-CM | POA: Diagnosis not present
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
## 2022-02-03 ENCOUNTER — Ambulatory Visit: Payer: Medicare HMO | Admitting: Neurology

## 2022-02-03 ENCOUNTER — Encounter: Payer: Self-pay | Admitting: Neurology

## 2022-02-03 VITALS — BP 93/60 | HR 64 | Ht 68.5 in | Wt 210.0 lb

## 2022-02-03 DIAGNOSIS — G20 Parkinson's disease: Secondary | ICD-10-CM | POA: Diagnosis not present

## 2022-02-03 DIAGNOSIS — G20A1 Parkinson's disease without dyskinesia, without mention of fluctuations: Secondary | ICD-10-CM

## 2022-02-03 DIAGNOSIS — F321 Major depressive disorder, single episode, moderate: Secondary | ICD-10-CM | POA: Diagnosis not present

## 2022-02-03 DIAGNOSIS — G3184 Mild cognitive impairment, so stated: Secondary | ICD-10-CM | POA: Diagnosis not present

## 2022-02-03 DIAGNOSIS — F419 Anxiety disorder, unspecified: Secondary | ICD-10-CM | POA: Diagnosis not present

## 2022-02-03 HISTORY — DX: Parkinson's disease without dyskinesia, without mention of fluctuations: G20.A1

## 2022-02-03 MED ORDER — CARBIDOPA-LEVODOPA 25-100 MG PO TABS: 1.0000 | ORAL_TABLET | Freq: Three times a day (TID) | ORAL | 1 refills | Status: DC

## 2022-02-03 NOTE — Patient Instructions (Signed)
We discussed that you have akinetic rigid Parkinsons Disease but that an atypical state cannot be ruled out (although I didn't think that was the case). Start Carbidopa Levodopa as follows: Take 1/2 tablet three times daily, at least 30 minutes before meals (approximately 7am/11am/4pm), for one week Then take 1/2 tablet in the morning, 1/2 tablet in the afternoon, 1 tablet in the evening, at least 30 minutes before meals, for one week Then take 1/2 tablet in the morning, 1 tablet in the afternoon, 1 tablet in the evening, at least 30 minutes before meals, for one week Then take 1 tablet three times daily at 7am/11am/4pm, at least 30 minutes before meals   As a reminder, carbidopa/levodopa can be taken at the same time as a carbohydrate, but we like to have you take your pill either 30 minutes before a protein source or 1 hour after as protein can interfere with carbidopa/levodopa absorption.  All further RX's will need to come from Dr. Krista Blue since you will be following up there.    We discussed that you will talk with your NP and see if you can find a replacement to the risperdone.  If it is for the acting out of the dreams only, then I would recommend low dose clonazepam, perhaps 0.25 mg.  If an antipsychotic is needed then quetiapine is a better choice for Parkinsons Disease patients.  You are going to follow back up with Dr. Krista Blue.  It was good to meet you today!  The physicians and staff at [facility] are committed to providing excellent care. You may receive a survey requesting feedback about your experience at our office. We strive to receive "very good" responses to the survey questions. If you feel that your experience would prevent you from giving the office a "very good " response, please contact our office to try to remedy the situation. We may be reached at [phone]. Thank you for taking the time out of your busy day to complete the survey.  Local and Online Resources for Power over  Parkinson's Group August 2023  LOCAL [place] PARKINSON'S GROUPS  Power over Parkinson's Group:   Power Over Parkinson's Patient Education Group will be Wednesday, August 9th-*Hybrid meting*- in person at [facility] location and via [facility] at 2:00 pm.   Upcoming Power over Parkinson's Meetings:  2nd Wednesdays of the month at 2 pm:  August 9th, September 13th, October 11th Contact Amy Marriott at [email or web address] if interested in participating in this group Parkinson's Care Partners Group:    3rd Mondays, Contact Misty Paladino Atypical Parkinsonian Patient Group:   4th Wednesdays, [place] If you are interested in participating in these groups with Misty, please contact her directly for how to join those meetings.  Her contact information is [email or web address].    LOCAL EVENTS AND NEW OFFERINGS Parkinson's T-shirts for sale!  Designed by a local group member, with funds going to [employer]. X-Large sizes available.  Contact Misty to purchase  New PWR! Moves [employer] Instructor-Led Class offering at [employer]!  Wednesdays 1-2 pm, starting April 12th.   Contact Ronalee Belts Sabin at [employer], [email or web address]   [place]:  [email or web address] PD Health at Home continues:  Mindfulness Mondays, Wellness Wednesdays, Fitness Fridays  Upcoming Education:   Care Partners:  Why they Matter and why their Needs Matter.  Wednesday, August 9th 1-2 pm Invisible Symptoms:  NonMotor Symptoms.  Wednesday, August 16th 1-2 pm Expert Briefing:   Parkinson's Disease  and the Bladder.  Wednesday, September 13th 1:00-2:00 pm Register for expert briefings (webinars) at [email or web address] Please check out their website to sign up for emails and see their full online offerings   [place]:  [email or web address]   Third Thursday Webinars:  On the third Thursday of every month at 12 p.m. ET, join our free live webinars to learn about various aspects of living with Parkinson's disease and our work to speed medical breakthroughs. Upcoming Webinar: Too Much or Not Enough:  Dyskinesia and "off" time in Parkinson's (Replay).  Thursday, August 17th at 12 noon. Check out additional information on their website to see their full online offerings  [facility]:  [email or web address] Upcoming Webinar:   Stay tuned Webinar Series:  Living with Parkinson's Meetup.   Third Thursdays each month, 3 pm Care Partner Monthly Meetup.  With Robin Searing Phinney.  First Tuesday of each month, 2 pm Check out additional information to Live Well Today on their website  Parkinson and Movement Disorders (PMD) Alliance:  www.[email or web address] [employer] Online:  Online Education Events Sign up for emails, which are sent weekly to give you updates on programming and online offerings  Parkinson's Association of the [place]:  www.[email or web address] Information on online support groups, education events, and online exercises including Yoga, Parkinson's exercises and more-LOTS of information on links to PD resources and online events Virtual Support Group through Parkinson's Association of the [place]; next one is scheduled for Wednesday, September 6th at 2 pm. (These are typically scheduled for the 1st Wednesday of the month at 2 pm).  Visit website for details. Save the date for "Caring for Parkinson's-Caring for You", 9th Annual Symposium.  In-person event in [place].  September 9th.  More info on registration to come. MOVEMENT AND EXERCISE OPPORTUNITIES PWR! Moves Classes at [place].  Wednesdays 10 and 11 am.   Contact Amy Gerrit Friends, PT [email or web address] if interested. NEW PWR! Moves Class offering at [employer].  Wednesdays 1-2 pm, starting April 12th.  Contact  Caron Presume at [place], [email or web address] Here is a link to the PWR!Moves classes on Zoom from [place] - Daily Mon-Sat at 10:00. Via Zoom, FREE and open to all.  There is also a link below via Facebook if you use that platform.  [email or web address]  Parkinson's Wellness Recovery (PWR! Moves)  www.[email or web address] Info on the PWR! Virtual Experience:  You will have access to our expertise through self-assessment, guided plans that start with the PD-specific fundamentals, educational content, tips, Q&A with an expert, and a growing Art therapist of PD-specific pre-recorded and live exercise classes of varying types and intensity - both physical and cognitive! If that is not enough, we offer 1:1 wellness consultations (in-person or virtual) to personalize your PWR! Research scientist (medical).  [place] Fridays:  As part of the PD Health @ Home program, this free video series focuses each week on one aspect of fitness designed to support people living with Parkinson's.  These weekly videos highlight the [place] recent fitness guidelines for people with Parkinson's disease. ModemGamers.si Dance for PD website is offering free, live-stream classes throughout the week, as well as links to [employer] of classes:  [email or web address] Virtual dance and Pilates for Parkinson's classes: Click on the Community Tab> Parkinson's Movement Initiative Tab.  To register for classes and for more information, visit [email or web address] and click the "community" tab.  [employer] Parkinson's Cycling Classes  [employer]:  Thursdays @ Noon-Live classes at [employer] ([facility] Forest Gleason at  [email or web address] or [phone]) [employer]: Virtual Classes Mondays and Thursdays Jeanette Caprice classes Tuesday, Wednesday and Thursday (contact [place] at [email or web address]  or [phone]) [place] Varied levels of classes are offered Tuesdays and Thursdays at [facility].  Stretching with Verdis Frederickson weekly class is also offered for people with Parkinson's To observe a class or for more information, call [phone] or email Hezzie Bump at [email or web address] ADDITIONAL SUPPORT AND RESOURCES Well[employer]Spring [employer]:Online Caregiver Education Opportunities:  www.well-springsolutions.org/caregiver-education/caregiver-support-group.  You may also contact Vickki Muff at [email or web address] or [phone].    Well-Spring Navigator:  035-248-1859 program, a free service to help individuals and families through the journey of determining care for older adults.  The "Navigator" is a [employer], Education officer, museum, who will speak with a prospective client and/or loved ones to provide an assessment of the situation and a set of recommendations for a personalized care plan -- all free of charge, and whether Well[employer]Spring [employer] offers the needed service or not. If the need is not a service we provide, we are well-connected with reputable programs in town that we can refer you to.  www.well-springsolutions.org or to speak with the Navigator, call [phone].

## 2022-02-05 DIAGNOSIS — C61 Malignant neoplasm of prostate: Secondary | ICD-10-CM | POA: Diagnosis not present

## 2022-02-05 DIAGNOSIS — N400 Enlarged prostate without lower urinary tract symptoms: Secondary | ICD-10-CM | POA: Diagnosis not present

## 2022-02-06 ENCOUNTER — Encounter: Payer: Self-pay | Admitting: Family Medicine

## 2022-02-06 ENCOUNTER — Ambulatory Visit (INDEPENDENT_AMBULATORY_CARE_PROVIDER_SITE_OTHER): Payer: Medicare HMO | Admitting: Family Medicine

## 2022-02-06 VITALS — BP 90/50 | HR 73 | Temp 98.3°F | Ht 68.5 in | Wt 210.0 lb

## 2022-02-06 DIAGNOSIS — I950 Idiopathic hypotension: Secondary | ICD-10-CM | POA: Diagnosis not present

## 2022-02-06 DIAGNOSIS — F411 Generalized anxiety disorder: Secondary | ICD-10-CM

## 2022-02-06 DIAGNOSIS — G20 Parkinson's disease: Secondary | ICD-10-CM | POA: Diagnosis not present

## 2022-02-06 DIAGNOSIS — G4752 REM sleep behavior disorder: Secondary | ICD-10-CM | POA: Diagnosis not present

## 2022-02-06 DIAGNOSIS — G3184 Mild cognitive impairment, so stated: Secondary | ICD-10-CM | POA: Diagnosis not present

## 2022-02-06 NOTE — Patient Instructions (Signed)
Please return in 6 months for your annual complete physical; please come fasting.   If you have any questions or concerns, please don't hesitate to send me a message via MyChart or call the office at [phone]. Thank you for visiting with us today! It's our pleasure caring for you.  

## 2022-02-06 NOTE — Progress Notes (Signed)
Subjective  CC:  Chief Complaint  Patient presents with   Hyperlipidemia    Pt here to F/U with cholesterol and colonoscopy is scheduled for 02/20/2022    HPI: Caleb Gibson is a 81 y.o. male who presents to the office today to address the problems listed above in the chief complaint. Reviewed neuro evaluation and consultations. + parkinsons now to start treatment. Adjusting anxiety meds as well. Reviewed all notes and answered questions. Overall he is doing well.   Assessment  1. Parkinson's disease (HCC)   2. Mild neurocognitive disorder   3. Idiopathic hypotension   4. GAD (generalized anxiety disorder)   5. REM behavioral disorder      Plan  Pt to work with neuro and psych to adjust meds: has tolerated seroquel in the past. Did not respond that well to klonopin. Trial of sinemet to start.  I spent a total of 34 minutes for this patient encounter. Time spent included preparation, face-to-face counseling with the patient and coordination of care, review of chart and records, and documentation of the encounter.   Follow up: 6 mo for cpe  Visit date not found  No orders of the defined types were placed in this encounter.  No orders of the defined types were placed in this encounter.     I reviewed the patients updated PMH, FH, and SocHx.    Patient Active Problem List   Diagnosis Date Noted   Mild neurocognitive disorder 12/19/2020    Priority: High   History of prostate cancer 11/22/2017    Priority: High   Tubular adenoma of colon 06/15/2017    Priority: High   On statin therapy due to risk of future cardiovascular event 10/08/2015    Priority: High   Mixed hyperlipidemia 09/27/2014    Priority: High   History of traumatic head injury     Priority: Medium    Idiopathic hypotension 01/07/2016    Priority: Medium    BPH (benign prostatic hyperplasia) 10/01/2011    Priority: Medium    Erectile dysfunction 10/01/2011    Priority: Medium    Osteoarthritis, knee  08/01/2010    Priority: Medium    Claustrophobia 07/08/2009    Priority: Medium    Basal cell carcinoma of skin 06/15/2017    Priority: Low   Simple cyst of kidney 11/12/2011    Priority: Low   Allergic rhinitis 08/27/2011    Priority: Low   Parkinsonism 09/09/2021   GAD (generalized anxiety disorder) 08/06/2021   REM behavioral disorder    Current Meds  Medication Sig   ALPRAZolam (XANAX) 0.5 MG tablet Take 1 tablet (0.5 mg total) by mouth daily as needed for anxiety.   carbidopa-levodopa (SINEMET IR) 25-100 MG tablet Take 1 tablet by mouth 3 (three) times daily. 7am/11am/4pm   cholecalciferol (VITAMIN D) 1000 units tablet Take 1,000 Units by mouth daily.    donepezil (ARICEPT) 10 MG tablet TAKE 1 TABLET EVERY DAY   Fluticasone Propionate (FLONASE NA) Place into the nose.   memantine (NAMENDA) 10 MG tablet TAKE 1 TABLET TWICE DAILY   methylphenidate (RITALIN) 5 MG tablet Take 5 mg by mouth every morning.   Multiple Vitamins-Minerals (CENTRUM SILVER) tablet Take 1 tablet by mouth daily.    OPTIVE 0.5-0.9 % ophthalmic solution Place 1 drop into the left eye 2 (two) times daily.   polyethylene glycol (MIRALAX) 17 g packet Take 17 g by mouth daily.   risperiDONE (RISPERDAL) 0.5 MG tablet Take 0.25 mg by mouth daily.  Take 2 as needed daily for anxiety   rosuvastatin (CRESTOR) 5 MG tablet TAKE 1/2 TABLET EVERY DAY   sertraline (ZOLOFT) 50 MG tablet Take 50 mg by mouth daily.   tamsulosin (FLOMAX) 0.4 MG CAPS capsule Take 0.4 mg by mouth.    Allergies: Patient is allergic to viagra [sildenafil citrate]. Family History: Patient family history includes Alcohol abuse in his maternal aunt and mother; Cancer in his maternal grandfather and maternal grandmother; Cirrhosis in his maternal aunt; Diabetes in his father; Glaucoma in his father; Heart disease in his maternal grandmother; Lymphoma in his mother; Melanoma in his mother; Mental illness in his mother; Prostate cancer in his  father. Social History:  Patient  reports that he quit smoking about 27 years ago. His smoking use included cigarettes. He has never used smokeless tobacco. He reports current alcohol use of about 2.0 - 5.0 standard drinks of alcohol per week. He reports that he does not use drugs.  Review of Systems: Constitutional: Negative for fever malaise or anorexia Cardiovascular: negative for chest pain Respiratory: negative for SOB or persistent cough Gastrointestinal: negative for abdominal pain  Objective  Vitals: BP (!) 90/50   Pulse 73   Temp 98.3 F (36.8 C)   Ht 5' 8.5" (1.74 m)   Wt 210 lb (95.3 kg)   SpO2 96%   BMI 31.47 kg/m  General: no acute distress , A&Ox3 Flat affect  Commons side effects, risks, benefits, and alternatives for medications and treatment plan prescribed today were discussed, and the patient expressed understanding of the given instructions. Patient is instructed to call or message via MyChart if he/she has any questions or concerns regarding our treatment plan. No barriers to understanding were identified. We discussed Red Flag symptoms and signs in detail. Patient expressed understanding regarding what to do in case of urgent or emergency type symptoms.  Medication list was reconciled, printed and provided to the patient in AVS. Patient instructions and summary information was reviewed with the patient as documented in the AVS. This note was prepared with assistance of Dragon voice recognition software. Occasional wrong-word or sound-a-like substitutions may have occurred due to the inherent limitations of voice recognition software  This visit occurred during the SARS-CoV-2 public health emergency.  Safety protocols were in place, including screening questions prior to the visit, additional usage of staff PPE, and extensive cleaning of exam room while observing appropriate contact time as indicated for disinfecting solutions.

## 2022-02-11 ENCOUNTER — Other Ambulatory Visit: Payer: Self-pay | Admitting: Family Medicine

## 2022-02-12 DIAGNOSIS — F321 Major depressive disorder, single episode, moderate: Secondary | ICD-10-CM | POA: Diagnosis not present

## 2022-02-12 DIAGNOSIS — F419 Anxiety disorder, unspecified: Secondary | ICD-10-CM | POA: Diagnosis not present

## 2022-02-16 DIAGNOSIS — F321 Major depressive disorder, single episode, moderate: Secondary | ICD-10-CM | POA: Diagnosis not present

## 2022-02-16 DIAGNOSIS — F419 Anxiety disorder, unspecified: Secondary | ICD-10-CM | POA: Diagnosis not present

## 2022-02-16 DIAGNOSIS — G3184 Mild cognitive impairment, so stated: Secondary | ICD-10-CM | POA: Diagnosis not present

## 2022-02-16 DIAGNOSIS — G20 Parkinson's disease: Secondary | ICD-10-CM | POA: Diagnosis not present

## 2022-02-20 ENCOUNTER — Encounter: Payer: Self-pay | Admitting: Gastroenterology

## 2022-02-20 ENCOUNTER — Ambulatory Visit (AMBULATORY_SURGERY_CENTER): Payer: Medicare HMO | Admitting: Gastroenterology

## 2022-02-20 VITALS — BP 128/64 | HR 58 | Temp 96.6°F | Resp 14 | Ht 68.0 in | Wt 209.0 lb

## 2022-02-20 DIAGNOSIS — D128 Benign neoplasm of rectum: Secondary | ICD-10-CM | POA: Diagnosis not present

## 2022-02-20 DIAGNOSIS — D123 Benign neoplasm of transverse colon: Secondary | ICD-10-CM | POA: Diagnosis not present

## 2022-02-20 DIAGNOSIS — Z09 Encounter for follow-up examination after completed treatment for conditions other than malignant neoplasm: Secondary | ICD-10-CM

## 2022-02-20 DIAGNOSIS — K635 Polyp of colon: Secondary | ICD-10-CM | POA: Diagnosis not present

## 2022-02-20 DIAGNOSIS — D120 Benign neoplasm of cecum: Secondary | ICD-10-CM

## 2022-02-20 DIAGNOSIS — Z86010 Personal history of colonic polyps: Secondary | ICD-10-CM | POA: Diagnosis not present

## 2022-02-20 MED ORDER — SODIUM CHLORIDE 0.9 % IV SOLN: 500.0000 mL | Freq: Once | INTRAVENOUS | Status: DC

## 2022-02-20 NOTE — Op Note (Signed)
[place] Patient Name: Caleb Gibson Procedure Date: 02/20/2022 10:52 AM MRN: [id number] Endoscopist: Remo Lipps P. Havery Moros , MD Age: 81 Referring MD:  Date of Birth: Mar 31, 1941 Gender: Male Account #: [id number] Procedure:                Colonoscopy Indications:              High risk colon cancer surveillance: Personal                            history of colonic polyps - adenomas and                            traditional serrated adenoma removed 08/2016 Medicines:                Monitored Anesthesia Care Procedure:                Pre-Anesthesia Assessment:                           - Prior to the procedure, a History and Physical                            was performed, and patient medications and                            allergies were reviewed. The patient's tolerance of                            previous anesthesia was also reviewed. The risks                            and benefits of the procedure and the sedation                            options and risks were discussed with the patient.                            All questions were answered, and informed consent                            was obtained. Prior Anticoagulants: The patient has                            taken no previous anticoagulant or antiplatelet                            agents. ASA Grade Assessment: III - A patient with                            severe systemic disease. After reviewing the risks                            and benefits, the patient was deemed in  satisfactory condition to undergo the procedure.                           After obtaining informed consent, the colonoscope                            was passed under direct vision. Throughout the                            procedure, the patient's blood pressure, pulse, and                            oxygen saturations were monitored continuously. The                            Olympus CF-HQ190L  [id number]) Colonoscope was                            introduced through the anus and advanced to the the                            cecum, identified by appendiceal orifice and                            ileocecal valve. The colonoscopy was performed                            without difficulty. The patient tolerated the                            procedure well. The quality of the bowel                            preparation was good. The ileocecal valve,                            appendiceal orifice, and rectum were photographed. Scope In: 10:56:40 AM Scope Out: 11:18:01 AM Scope Withdrawal Time: 0 hours 18 minutes 19 seconds  Total Procedure Duration: 0 hours 21 minutes 21 seconds  Findings:                 The perianal and digital rectal examinations were                            normal.                           A 5 mm polyp was found in the cecum. The polyp was                            flat. The polyp was removed with a cold snare.                            Resection and retrieval were complete.  Three sessile polyps were found in the transverse                            colon. The polyps were diminutive in size. These                            polyps were removed with a cold snare. Resection                            and retrieval were complete.                           A diminutive polyp was found in the splenic                            flexure. The polyp was sessile. The polyp was                            removed with a cold snare. Resection and retrieval                            were complete.                           A 3 mm polyp was found in the recto-sigmoid colon.                            The polyp was sessile. The polyp was removed with a                            cold snare. Resection and retrieval were complete.                           A 3 mm polyp was found in the rectum. The polyp was                            sessile.  The polyp was removed with a cold snare.                            Resection and retrieval were complete.                           Multiple small-mouthed diverticula were found in                            the sigmoid colon.                           Internal hemorrhoids were found during retroflexion.                           A few small angiodysplastic lesions were found in  the distal rectum.                           The exam was otherwise without abnormality. Complications:            No immediate complications. Estimated blood loss:                            Minimal. Estimated Blood Loss:     Estimated blood loss was minimal. Impression:               - One 5 mm polyp in the cecum, removed with a cold                            snare. Resected and retrieved.                           - Three diminutive polyps in the transverse colon,                            removed with a cold snare. Resected and retrieved.                           - One diminutive polyp at the splenic flexure,                            removed with a cold snare. Resected and retrieved.                           - One 3 mm polyp at the recto-sigmoid colon,                            removed with a cold snare. Resected and retrieved.                           - One 3 mm polyp in the rectum, removed with a cold                            snare. Resected and retrieved.                           - Diverticulosis in the sigmoid colon.                           - Internal hemorrhoids.                           - A few colonic angiodysplastic lesions.                           - The examination was otherwise normal. Recommendation:           - Patient has a contact number available for                            emergencies.  The signs and symptoms of potential                            delayed complications were discussed with the                            patient. Return to normal  activities tomorrow.                            Written discharge instructions were provided to the                            patient.                           - Resume previous diet.                           - Continue present medications.                           - Await pathology results. Likely no further                            surveillance exams warranted given no high risk                            lesions on this exam and the patient's age Carlota Raspberry. Blasa Raisch, MD 02/20/2022 11:23:08 AM This report has been signed electronically.

## 2022-02-20 NOTE — Progress Notes (Signed)
PT taken to PACU. Monitors in place. VSS. Report given to RN. 

## 2022-02-20 NOTE — Progress Notes (Signed)
Vitals-AS  Pt's states no medical or surgical changes since previsit or office visit.  

## 2022-02-20 NOTE — Progress Notes (Signed)
[facility] History and Physical   Primary Care Physician:  [name], MD   Reason for Procedure:   History of polyps  Plan:    colonoscopy     HPI: Caleb Gibson is a 81 y.o. male  here for colonoscopy surveillance - adenomas + traditional serrated adenoma removed 08/2016. Patient has chronic constipation. No family history of colon cancer known. Otherwise feels well without any cardiopulmonary symptoms and wants to proceed.  I have discussed risks / benefits of anesthesia and endoscopic procedure with Caleb Gibson and they wish to proceed with the exams as outlined today.    Past Medical History:  Diagnosis Date   Allergic rhinitis 08/27/2011   Anxiety    Basal cell carcinoma of skin    BPH (benign prostatic hyperplasia) 10/01/2011   Had urology work up; psa normal   Claustrophobia    GAD (generalized anxiety disorder) 08/06/2021   History of prostate cancer 11/22/2017   dxd 11/2017: active surveillance then rads tx 2020   History of traumatic head injury    Idiopathic hypotension 01/07/2016    Normal stress Echo - 12/2014 Neg adrenal insufficiency work up by endocrine 2017  Formatting of this note might be different from the original. Normal stress Echo - 12/2014   Mild neurocognitive disorder 12/19/2020   Mixed hyperlipidemia 09/27/2014   Night terrors    Osteoarthritis, knee 08/01/2010   Parkinsonism 09/09/2021   REM behavioral disorder    Simple cyst of kidney 11/12/2011   Overview:  Stable on CT scan/ no further imaging or work up needed/cla  Formatting of this note might be different from the original. Stable on CT scan/ no further imaging or work up needed/cla   Tubular adenoma of colon 06/15/2017   Colonoscopy 08/2016; rec repeat in 5 years.     Past Surgical History:  Procedure Laterality Date   APPENDECTOMY     DENTAL SURGERY     Had a tooth pulled in 01/2017   KNEE ARTHROSCOPY WITH MENISCAL REPAIR Right    MOHS SURGERY     RHINOPLASTY      TONSILLECTOMY AND ADENOIDECTOMY      Prior to Admission medications   Medication Sig Start Date End Date Taking? Authorizing Provider  carbidopa-levodopa (SINEMET IR) 25-100 MG tablet Take 1 tablet by mouth 3 (three) times daily. 7am/11am/4pm 02/03/22  Yes Tat, Eustace Quail, DO  cholecalciferol (VITAMIN D) 1000 units tablet Take 1,000 Units by mouth daily.    Yes [provider]  donepezil (ARICEPT) 10 MG tablet TAKE 1 TABLET EVERY DAY 06/10/21  Yes Marcial Pacas, MD  Fluticasone Propionate (FLONASE NA) Place into the nose.   Yes [provider]  memantine (NAMENDA) 10 MG tablet TAKE 1 TABLET TWICE DAILY 06/10/21  Yes Marcial Pacas, MD  methylphenidate (RITALIN) 5 MG tablet Take 5 mg by mouth every morning. 12/04/21  Yes [provider]  Multiple Vitamins-Minerals (CENTRUM SILVER) tablet Take 1 tablet by mouth daily.  06/20/13  Yes [provider]  OPTIVE 0.5-0.9 % ophthalmic solution Place 1 drop into the left eye 2 (two) times daily. 10/20/21  Yes [provider]  polyethylene glycol (MIRALAX) 17 g packet Take 17 g by mouth daily. 01/07/22  Yes Evaleen Sant, Carlota Raspberry, MD  risperiDONE (RISPERDAL) 0.5 MG tablet Take 0.25 mg by mouth daily. Take 2 as needed daily for anxiety   Yes [provider]  rosuvastatin (CRESTOR) 5 MG tablet TAKE 1/2 TABLET EVERY DAY 02/11/22  Yes Billey Chang  L, MD  sertraline (ZOLOFT) 50 MG tablet Take 50 mg by mouth daily. 12/24/21  Yes [provider]  tamsulosin (FLOMAX) 0.4 MG CAPS capsule Take 0.4 mg by mouth.   Yes [provider]  ALPRAZolam Duanne Moron) 0.5 MG tablet Take 1 tablet (0.5 mg total) by mouth daily as needed for anxiety. 10/15/20   [name], MD    Current Outpatient Medications  Medication Sig Dispense Refill   carbidopa-levodopa (SINEMET IR) 25-100 MG tablet Take 1 tablet by mouth 3 (three) times daily. 7am/11am/4pm 270 tablet 1   cholecalciferol (VITAMIN D) 1000 units tablet Take 1,000 Units by  mouth daily.      donepezil (ARICEPT) 10 MG tablet TAKE 1 TABLET EVERY DAY 90 tablet 4   Fluticasone Propionate (FLONASE NA) Place into the nose.     memantine (NAMENDA) 10 MG tablet TAKE 1 TABLET TWICE DAILY 180 tablet 4   methylphenidate (RITALIN) 5 MG tablet Take 5 mg by mouth every morning.     Multiple Vitamins-Minerals (CENTRUM SILVER) tablet Take 1 tablet by mouth daily.      OPTIVE 0.5-0.9 % ophthalmic solution Place 1 drop into the left eye 2 (two) times daily.     polyethylene glycol (MIRALAX) 17 g packet Take 17 g by mouth daily. 14 each 0   risperiDONE (RISPERDAL) 0.5 MG tablet Take 0.25 mg by mouth daily. Take 2 as needed daily for anxiety     rosuvastatin (CRESTOR) 5 MG tablet TAKE 1/2 TABLET EVERY DAY 45 tablet 3   sertraline (ZOLOFT) 50 MG tablet Take 50 mg by mouth daily.     tamsulosin (FLOMAX) 0.4 MG CAPS capsule Take 0.4 mg by mouth.     ALPRAZolam (XANAX) 0.5 MG tablet Take 1 tablet (0.5 mg total) by mouth daily as needed for anxiety. 30 tablet 0   Current Facility-Administered Medications  Medication Dose Route Frequency Provider Last Rate Last Admin   0.9 %  sodium chloride infusion  500 mL Intravenous Once Ladasha Schnackenberg, Carlota Raspberry, MD        Allergies as of 02/20/2022 - Review Complete 02/20/2022  Allergen Reaction Noted   Viagra [sildenafil citrate] Other (See Comments) 05/10/2018    Family History  Problem Relation Age of Onset   Alcohol abuse Mother    Melanoma Mother    Lymphoma Mother    Mental illness Mother    Prostate cancer Father    Diabetes Father    Glaucoma Father    Heart disease Maternal Grandmother    Cancer Maternal Grandmother        type unknown   Cancer Maternal Grandfather        type unknown   Alcohol abuse Maternal Aunt    Cirrhosis Maternal Aunt     Social History   Socioeconomic History   Marital status: Married    Spouse name: Not on file   Number of children: 3   Years of education: 14   Highest education level: Some  college, no degree  Occupational History   Occupation: Retired    Comment: [employer]; Press photographer x 50 years  Tobacco Use   Smoking status: Former    Types: Cigarettes    Quit date: 1996    Years since quitting: 27.7   Smokeless tobacco: Never  Vaping Use   Vaping Use: Never used  Substance and Sexual Activity   Alcohol use: Yes    Alcohol/week: 2.0 - 5.0 standard drinks of alcohol    Types: 2 -  5 Cans of beer per week    Comment: 2 beers/week (prior heavier)   Drug use: No   Sexual activity: Yes  Other Topics Concern   Not on file  Social History Narrative   Lives at home with his wife.   One 12oz can of Diet Coke per day.   Right-handed.   Social Determinants of Health   Financial Resource Strain: Low Risk  (05/05/2021)   Overall Financial Resource Strain (CARDIA)    Difficulty of Paying Living Expenses: Not very hard  Food Insecurity: Not on file  Transportation Needs: Not on file  Physical Activity: Not on file  Stress: Not on file  Social Connections: Not on file  Intimate Partner Violence: Not on file    Review of Systems: All other review of systems negative except as mentioned in the HPI.  Physical Exam: Vital signs BP (!) 141/72   Pulse 77   Temp (!) 96.6 F (35.9 C) (Temporal)   Resp 16   Ht '5\' 8"'$  (1.727 m)   Wt 209 lb (94.8 kg)   SpO2 100%   BMI 31.78 kg/m   General:   Alert,  Well-developed, pleasant and cooperative in NAD Lungs:  Clear throughout to auscultation.   Heart:  Regular rate and rhythm Abdomen:  Soft, nontender and nondistended.   Neuro/Psych:  Alert and cooperative. Normal mood and affect. A and O x 3  Jolly Mango, MD [facility]

## 2022-02-20 NOTE — Patient Instructions (Addendum)
Handouts on polyps given to patient. Await pathology results. Resume previous diet and continue present medications. Most likely no repeat colonoscopy for surveillance unless needed.  YOU HAD AN ENDOSCOPIC PROCEDURE TODAY AT [facility]:   Refer to the procedure report that was given to you for any specific questions about what was found during the examination.  If the procedure report does not answer your questions, please call your gastroenterologist to clarify.  If you requested that your care partner not be given the details of your procedure findings, then the procedure report has been included in a sealed envelope for you to review at your convenience later.  YOU SHOULD EXPECT: Some feelings of bloating in the abdomen. Passage of more gas than usual.  Walking can help get rid of the air that was put into your GI tract during the procedure and reduce the bloating. If you had a lower endoscopy (such as a colonoscopy or flexible sigmoidoscopy) you may notice spotting of blood in your stool or on the toilet paper. If you underwent a bowel prep for your procedure, you may not have a normal bowel movement for a few days.  Please Note:  You might notice some irritation and congestion in your nose or some drainage.  This is from the oxygen used during your procedure.  There is no need for concern and it should clear up in a day or so.  SYMPTOMS TO REPORT IMMEDIATELY:  Following lower endoscopy (colonoscopy or flexible sigmoidoscopy):  Excessive amounts of blood in the stool  Significant tenderness or worsening of abdominal pains  Swelling of the abdomen that is new, acute  Fever of 100F or higher  For urgent or emergent issues, a gastroenterologist can be reached at any hour by calling [phone]. Do not use MyChart messaging for urgent concerns.    DIET:  We do recommend a small meal at first, but then you may proceed to your regular diet.  Drink plenty of fluids but you  should avoid alcoholic beverages for 24 hours.  ACTIVITY:  You should plan to take it easy for the rest of today and you should NOT DRIVE or use heavy machinery until tomorrow (because of the sedation medicines used during the test).    FOLLOW UP: Our staff will call the number listed on your records the next business day following your procedure.  We will call around 7:15- 8:00 am to check on you and address any questions or concerns that you may have regarding the information given to you following your procedure. If we do not reach you, we will leave a message.     If any biopsies were taken you will be contacted by phone or by letter within the next 1-3 weeks.  Please call us at [phone] if you have not heard about the biopsies in 3 weeks.    SIGNATURES/CONFIDENTIALITY: You and/or your care partner have signed paperwork which will be entered into your electronic medical record.  These signatures attest to the fact that that the information above on your After Visit Summary has been reviewed and is understood.  Full responsibility of the confidentiality of this discharge information lies with you and/or your care-partner.

## 2022-02-20 NOTE — Progress Notes (Signed)
Called to room to assist during endoscopic procedure.  Patient ID and intended procedure confirmed with present staff. Received instructions for my participation in the procedure from the performing physician.  

## 2022-02-23 ENCOUNTER — Telehealth: Payer: Self-pay | Admitting: *Deleted

## 2022-02-23 NOTE — Telephone Encounter (Signed)
  Follow up Call-     02/20/2022    9:51 AM 02/20/2022    9:44 AM  Call back number  Post procedure Call Back phone  # [phone]   Permission to leave phone message  Yes     Patient questions:  Do you have a fever, pain , or abdominal swelling? No. Pain Score  0 *  Have you tolerated food without any problems? Yes.    Have you been able to return to your normal activities? Yes.    Do you have any questions about your discharge instructions: Diet   No. Medications  No. Follow up visit  No.  Do you have questions or concerns about your Care? No.  Actions: * If pain score is 4 or above: No action needed, pain <4.

## 2022-02-24 ENCOUNTER — Encounter: Payer: Self-pay | Admitting: Gastroenterology

## 2022-03-09 DIAGNOSIS — F419 Anxiety disorder, unspecified: Secondary | ICD-10-CM | POA: Diagnosis not present

## 2022-03-09 DIAGNOSIS — F321 Major depressive disorder, single episode, moderate: Secondary | ICD-10-CM | POA: Diagnosis not present

## 2022-03-16 ENCOUNTER — Encounter: Payer: Self-pay | Admitting: Neurology

## 2022-03-16 ENCOUNTER — Ambulatory Visit: Payer: Medicare HMO | Admitting: Neurology

## 2022-03-16 VITALS — BP 99/61 | HR 67 | Ht 68.0 in | Wt 205.0 lb

## 2022-03-16 DIAGNOSIS — G20C Parkinsonism, unspecified: Secondary | ICD-10-CM

## 2022-03-16 DIAGNOSIS — G3184 Mild cognitive impairment, so stated: Secondary | ICD-10-CM | POA: Diagnosis not present

## 2022-03-16 MED ORDER — MEMANTINE HCL 10 MG PO TABS: 10.0000 mg | ORAL_TABLET | Freq: Two times a day (BID) | ORAL | 4 refills | Status: DC

## 2022-03-16 MED ORDER — DONEPEZIL HCL 10 MG PO TABS: 10.0000 mg | ORAL_TABLET | Freq: Every day | ORAL | 4 refills | Status: DC
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
## 2022-03-23 DIAGNOSIS — F321 Major depressive disorder, single episode, moderate: Secondary | ICD-10-CM | POA: Diagnosis not present

## 2022-03-23 DIAGNOSIS — F419 Anxiety disorder, unspecified: Secondary | ICD-10-CM | POA: Diagnosis not present

## 2022-03-23 DIAGNOSIS — G3184 Mild cognitive impairment, so stated: Secondary | ICD-10-CM | POA: Diagnosis not present

## 2022-03-24 DIAGNOSIS — F324 Major depressive disorder, single episode, in partial remission: Secondary | ICD-10-CM | POA: Diagnosis not present

## 2022-03-30 ENCOUNTER — Telehealth: Payer: Self-pay | Admitting: Neurology

## 2022-03-30 MED ORDER — DONEPEZIL HCL 10 MG PO TABS: 10.0000 mg | ORAL_TABLET | Freq: Every day | ORAL | 4 refills | Status: DC

## 2022-03-30 MED ORDER — MEMANTINE HCL 10 MG PO TABS: 10.0000 mg | ORAL_TABLET | Freq: Two times a day (BID) | ORAL | 4 refills | Status: DC

## 2022-03-30 NOTE — Telephone Encounter (Signed)
Pt's wife, Caleb Gibson request refill be resent for donepezil (ARICEPT) 10 MG tablet and memantine (NAMENDA) 10 MG tablet at [facility]

## 2022-03-30 NOTE — Telephone Encounter (Signed)
Rx sent 

## 2022-04-16 ENCOUNTER — Encounter: Payer: Self-pay | Admitting: Internal Medicine

## 2022-04-16 ENCOUNTER — Ambulatory Visit (INDEPENDENT_AMBULATORY_CARE_PROVIDER_SITE_OTHER): Payer: Medicare HMO | Admitting: Internal Medicine

## 2022-04-16 VITALS — BP 91/59 | HR 79 | Temp 97.3°F | Resp 14 | Ht 68.0 in | Wt 205.8 lb

## 2022-04-16 DIAGNOSIS — R197 Diarrhea, unspecified: Secondary | ICD-10-CM

## 2022-04-16 LAB — COMPREHENSIVE METABOLIC PANEL
ALT: 4 U/L (ref 0–53)
AST: 10 U/L (ref 0–37)
Albumin: 4 g/dL (ref 3.5–5.2)
Alkaline Phosphatase: 77 U/L (ref 39–117)
BUN: 15 mg/dL (ref 6–23)
CO2: 26 mEq/L (ref 19–32)
Calcium: 8.5 mg/dL (ref 8.4–10.5)
Chloride: 108 mEq/L (ref 96–112)
Creatinine, Ser: 0.9 mg/dL (ref 0.40–1.50)
GFR: 79.82 mL/min (ref >60.00)
Glucose, Bld: 81 mg/dL (ref 70–99)
Potassium: 3.9 mEq/L (ref 3.5–5.1)
Sodium: 141 mEq/L (ref 135–145)
Total Bilirubin: 0.5 mg/dL (ref 0.2–1.2)
Total Protein: 5.8 g/dL — ABNORMAL LOW (ref 6.0–8.3)

## 2022-04-16 LAB — CBC
HCT: 34.6 % — ABNORMAL LOW (ref 39.0–52.0)
Hemoglobin: 11.9 g/dL — ABNORMAL LOW (ref 13.0–17.0)
MCHC: 34.5 g/dL (ref 30.0–36.0)
MCV: 92.8 fl (ref 78.0–100.0)
Platelets: 191 10*3/uL (ref 150.0–400.0)
RBC: 3.73 Mil/uL — ABNORMAL LOW (ref 4.22–5.81)
RDW: 13.5 % (ref 11.5–15.5)
WBC: 4.8 10*3/uL (ref 4.0–10.5)

## 2022-04-16 LAB — MAGNESIUM: Magnesium: 1.9 mg/dL (ref 1.5–2.5)

## 2022-04-16 LAB — TSH: TSH: 1.11 u[IU]/mL (ref 0.35–5.50)

## 2022-04-16 NOTE — Patient Instructions (Addendum)
It was a pleasure seeing you today! I truly hope you feel like you received 5 star service and please let me know if there is anything I can improve.  Loralee Pacas, MD   Today the plan is...  Diarrhea, unspecified type  Game plan is keep drinking lots of fluid make sure that you have some electrolytes and those fluids so Gatorade is good, cut out the probiotics and follow the FODMAP diet.  I am ordering thyroid testing but I do not think it is going to find anything.  Doing blood for the thyroid we will also go ahead and get the the basic blood work for blood count and electrolytes to see where those stand. Ok to continue imodium.  FODMAP stands for "fermentable oligodi-monosaccharides and polyols," or, more simply, certain types of carbohydrates found in foods that are hard to digest. By following FODMAP, also known as a low-FODMAP diet, you avoid or limit these particular carbohydrates. Some of the foods that contain FODMAPs include: - Fruits such as apples, apricots, blackberries, cherries, mango, nectarines, pears, plums, and watermelon, or juice containing any of these fruits - Canned fruit in natural fruit juice, or large quantities of fruit juice or dried fruit - Vegetables such as artichokes, asparagus, beans, cabbage, cauliflower, garlic and garlic salts, lentils, mushrooms, onions, and sugar snap or snow peas - Dairy products such as milk, milk products, soft cheese, yogurt, custard, and ice cream - Wheat and rye products - Honey and foods with high-fructose corn syrup  - Products, including candy and gum, with sweeteners ending in "-ol" (for example, sorbitol, mannitol, xylitol, and maltitol)   My favorite websites for more information is: [email or web address]     '[x]'$  RETURN TO CLINIC: No follow-ups on file.   - If you are not doing well: RETURN to the office sooner. - Please bring all your medicines to each appointment.  - If your condition begins to worsen or  become severe:  GO to the ER.  '[x]'$  QUESTIONS/CONCERNS:  If you have follow-up questions / concerns:  - CLINICAL: please contact me via phone [phone] OR MyChart messaging  - [place] you will be contacted with the lab results as soon as they are available. For any labs or imaging tests, we will call you if the results are significantly abnormal.  Most normal results will be posted to myChart as soon as they are available and I will comment on them there within 2-3 business days.  The fastest way to get your results is to activate your My Chart account. Instructions are located on the last page of this paperwork. If you have not heard from [place] regarding the results in 2 weeks, please contact this office.  - BILLING: xray and lab orders are billed from separate companies and questions./concerns should be directed to the [facility] [place].  For visit charges please discuss with our administrative services

## 2022-04-16 NOTE — Progress Notes (Addendum)
[facility] [employer] [employer]:  [phone]   Routine Medical Office Visit  Patient:  Caleb Gibson      Age: 81 y.o.       Sex:  male  Date:   04/16/2022  PCP:    [name], [place] Provider: Loralee Pacas, MD  Assessment/Plan:   Saheed was seen today for diarrhea.  Diarrhea, unspecified type  We did a detailed discussion of the various possible explanations for the diarrhea and I think at the top of my differential are viral gastroenteritis, or drug-induced secretory diarrhea possibly from Sinemet or one of the medicines he takes for memory  We, wife's preferred diagnosis of anxiety induced diarrhea being very highly and the possibilities but the possible possibility of treating with amitriptyline created some anxiety for both of [place] about the potential for medicine interactions so were not going to do that.  Instead were going to just do continue to Imodium try to treat the anxiety.  He did miss a dose of sertraline and experienced bad anxiety associated with it but that is probably not going to explain all of the diarrhea offered to go up on this or remain if you would like.  Due to the possibility of SIBO despite taking probiotics I recommend stopping the current probiotic in case it is contributing to that but this is low likelihood to make any difference.  BP was noted to be low but per patient and wife it is always like this for years even before parkinsons diagnosis.  BP Readings from Last 3 Encounters:  04/16/22 (!) 91/59  03/16/22 99/61  02/20/22 128/64     Ai analysis of the case follows and was reviewed with patient and wife:  Comprehensive Review of the Case: The patient is an 81 year old man with a history of prostate cancer treated with radiation, Parkinsonism, anxiety disorder, lactose intolerance, constipation, hemorrhoids, and diverticulitis. He has been experiencing diarrhea for the past 5 days, which has been progressively worsening. He  has been taking Imodium, which provides some relief. He has a history of consuming cereal daily for the past 5 years despite his lactose intolerance. He has had watery diarrhea five times in the last eight hours, with a two-hour break after taking Imodium. He had a colonoscopy two months ago, which showed polyps that were removed. His last change in medication was two to three months ago when he started taking Sinemet. His other medications include methylphenidate, donepezil, memantine, tamsulosin, sertraline, and Xanax. He has been experiencing nausea for at least three months, which improves later in the day and is linked to Sinemet. He denies fever, weight loss, and abdominal pain, but reports some bloating. No laboratory or imaging data were provided in the summary.  Most Likely Dx:  a. Medication-induced diarrhea: The patient's diarrhea could be a side effect of his medications, particularly Sinemet, which he started taking two to three months ago. The presence of other side effects associated with Sinemet, such as nausea, could further suggest this diagnosis.  Expanded DDx: b. Lactose intolerance: Despite consuming cereal daily for the past five years, the patient's diarrhea could be a result of his reported lactose intolerance. The presence of other symptoms associated with lactose intolerance, such as bloating, could further suggest this diagnosis. c. Irritable Bowel Syndrome (IBS): The patient's diarrhea, bloating, and history of constipation could be indicative of IBS. The presence of other symptoms associated with IBS, such as abdominal pain or discomfort, could  further suggest this diagnosis.  Alternative DDx: d. Clostridium difficile infection: Given the patient's age and history of recent colonoscopy, a Clostridium difficile infection could be a possibility. The presence of other symptoms associated with this infection, such as fever or abdominal pain, could further suggest this  diagnosis. e. Radiation enteritis: The patient's history of radiation treatment for prostate cancer could have led to radiation enteritis, causing his diarrhea. The presence of other symptoms associated with radiation enteritis, such as nausea or abdominal cramping, could further suggest this diagnosis. f. Parkinson's disease-related autonomic dysfunction: The patient's Parkinson's disease could be causing autonomic dysfunction, leading to diarrhea. The presence of other symptoms of autonomic dysfunction, such as orthostatic hypotension or urinary incontinence, could further suggest this diagnosis. g. Anxiety-induced diarrhea: The patient's upcoming trip could be causing anxiety, leading to diarrhea. The presence of other symptoms associated with anxiety, such as restlessness or difficulty concentrating, could further suggest this diagnosis. h. Small Intestinal Bacterial Overgrowth (SIBO): The patient's history of constipation and diverticulitis could have led to SIBO, causing his diarrhea. The presence of other symptoms associated with SIBO, such as bloating or weight loss, could further suggest this diagnosis. i. Colorectal cancer: Given the patient's age and history of polyps, colorectal cancer could be a possibility. The presence of other symptoms associated with colorectal cancer, such as rectal bleeding or unexplained weight loss, could further suggest this diagnosis. j. Hyperthyroidism: Although not mentioned in the summary, hyperthyroidism could be a possibility given the patient's age and symptoms. The presence of other symptoms associated with hyperthyroidism, such as increased appetite or heart palpitations, could further suggest this diagnosis.  #Acute Diarrhea  The 81 year old patient presents with acute diarrhea, having experienced watery stools five times in the last eight hours. The diarrhea has been ongoing for five days and is progressively worsening. The patient has a history of lactose  intolerance, but has been consuming cereal daily for the past five years without issue. The patient also has a history of constipation, as evidenced by hemorrhoids and diverticulitis. The patient's past medical history includes prostate cancer treated with radiation, parkinsonism, and an anxiety disorder, which appears to be exacerbated by an upcoming trip. The patient has been using Imodium, which provides some relief. The patient's recent colonoscopy showed polyps, which were removed, and a normal perirectal and anal exam. The differential diagnosis includes acute infectious diarrhea, osmotic diarrhea due to lactose intolerance, and functional diarrhea due to anxiety.  Dx - Strict I/O - CBC, BMP, Mg, LFTs - UA w/ reflex to culture - C diff PCR - Stool culture, stool O&P, fecal leukocytes - Stool osmotic gap = 290 - 2*(stool [Na] + [K])  Tx - Encourage PO rehydration - Replete K > 4, Mg > 2 - IV fluids PRN to maintain adequate hydration - Symptom management with loperamide (if C diff negative or being treated)   * Loperamide 4 mg x 1 then 2 mg qAC + qHS (max 16 mg/day) - Eliminate items that could contribute to osmotic diarrhea and maintain food diary - Symptomatic treatment with loperamide, eluxadoline, probiotics, alosetron, amitriptyline - Consider hydrogen breath test and rifaximin if concern for SIBO - Consider fecal microbiome transplant - Consider dietary modifications and low FODMAP diet  Discussed of the artificial intelligence recommended testing and they are going to decline the stool testing which I think is probably the least valuable and count of a hassle to collect and expensive.  No follow-ups on file.   Today's key discussion points - also  in After Visit Summary (AVS) Medication list was reconciled and patient instructions and summary information was documented and made available for him to review in the AVS (see AVS).  This note is also available to patient for review  for accuracy and understanding. He was encouraged to contact our office by phone or message via MyChart if he has any questions or concerns regarding our treatment plan (see AVS).  We discussed red flag symptoms and signs in detail and when to call the office or go to ER if his condition worsens (see AFTER VISIT SUMMARY). He expressed understanding.  Memory barriers to understanding were identified     Subjective:   Caleb Gibson is a 81 y.o. male with PMH significant for: Past Medical History:  Diagnosis Date   Allergic rhinitis 08/27/2011   Anxiety    Basal cell carcinoma of skin    BPH (benign prostatic hyperplasia) 10/01/2011   Had urology work up; psa normal   Claustrophobia    GAD (generalized anxiety disorder) 08/06/2021   History of prostate cancer 11/22/2017   dxd 11/2017: active surveillance then rads tx 2020   History of traumatic head injury    Idiopathic hypotension 01/07/2016    Normal stress Echo - 12/2014 Neg adrenal insufficiency work up by endocrine 2017  Formatting of this note might be different from the original. Normal stress Echo - 12/2014   Mild neurocognitive disorder 12/19/2020   Mixed hyperlipidemia 09/27/2014   Night terrors    Osteoarthritis, knee 08/01/2010   Parkinsonism 09/09/2021   REM behavioral disorder    Simple cyst of kidney 11/12/2011   Overview:  Stable on CT scan/ no further imaging or work up needed/cla  Formatting of this note might be different from the original. Stable on CT scan/ no further imaging or work up needed/cla   Tubular adenoma of colon 06/15/2017   Colonoscopy 08/2016; rec repeat in 5 years.      He is presenting today with: Chief Complaint  Patient presents with   Diarrhea    For the last five days. Discomfort in stomach. Was severe last night. Up in the middle of the night to use the restroom.     Additional physician-collected and physician-reviewed history: See Assessment/Plan section for today's problem updates to  charted history (under Overview: and Assessment & Plan for each problem)  Diarrhea for 5 days progressively worsening Needs to go on a trip in 6 days Imodium helps some No recent antibiotics  He once reported a history of lactose intolerance but he has had cereal every day for the last 5 years Watery diarrhea x 5 times in last 8 hours. 2 hours without with imodium. History of constipation as evidenced by hemorrhoids and diverticulitis. Colonoscopy 2 month ago  showed polyps that were removed also and normal perirectal and anal exam. Past medical history is significant for prostate cancer treated by radiation only around 2 to 3 years ago parkinsonism and an anxiety disorder that seems to be flared by the upcoming trip he is going to take The last change to any of his medications was 2 or 3 months ago when he started taking Sinemet, his other meds include methylphenidate donepezil memantine tamsulosin sertraline and Xanax Had the nausea for a least 3 months that improves later in day and links to sinemet  Colonoscopy 02/20/2022 for follow up polyps showed: - The perianal and digital rectal examinations were normal. - A 5 mm polyp was found in the cecum. The polyp was flat.  The polyp was removed with a cold snare. Resection and retrieval were complete. - Three sessile polyps were found in the transverse colon. The polyps were diminutive in size. These polyps were removed with a cold snare. Resection and retrieval were complete. - A diminutive polyp was found in the splenic flexure. The polyp was sessile. The polyp was removed with a cold snare. Resection and retrieval were complete. - A 3 mm polyp was found in the recto-sigmoid colon. The polyp was sessile. The polyp was removed with a cold snare. Resection and retrieval were complete. - A 3 mm polyp was found in the rectum. The polyp was sessile. The polyp was removed with a cold snare. Resection and retrieval were complete. - Multiple  small-mouthed diverticula were found in the sigmoid colon. - Internal hemorrhoids were found during retroflexion. - A few small angiodysplastic lesions were found in the distal rectum.        Objective:  Physical Exam: BP (!) 91/59 (BP Location: Right Arm, Patient Position: Sitting)   Pulse 79   Temp (!) 97.3 F (36.3 C) (Temporal)   Resp 14   Ht '5\' 8"'$  (1.727 m)   Wt 205 lb 12.8 oz (93.4 kg)   SpO2 96%   BMI 31.29 kg/m   He is a polite, friendly, and genuine person Constitutional: NAD, AAO, not ill-appearing  Neuro: alert, no focal deficit obvious, articulate speech Psych: normal mood, behavior, thought content  Problem-specific physical exam findings:  No abdominal pain, moist tongue and conjunctiva and good skin turgor, no abdominal tenderness mass guarding or distension.

## 2022-04-17 ENCOUNTER — Telehealth: Payer: Self-pay | Admitting: Family Medicine

## 2022-04-17 NOTE — Telephone Encounter (Signed)
Caller states: -She was informed by someone yesterday that she could call today to check on husband's lab results   Caller requests: - A callback to discuss patient's lab results   Caller is on patient's DPR

## 2022-04-17 NOTE — Telephone Encounter (Signed)
Spoke with pt and advised lab results, pt verbalized understanding.  

## 2022-04-17 NOTE — Progress Notes (Signed)
Please notify him that I reviewed these results and see how he is doing.  Ask him to check his blood pressure   Loralee Pacas, MD  04/17/2022 8:12 AM

## 2022-04-17 NOTE — Progress Notes (Signed)
Please notify him that I reviewed these results and, in my medical opinion:  There is only one abnormal- slightly low hemoglobin so make sure no blood in the diarrhea because with that low blood pressure.  See how the diarrhea is doing.. if its getting better with FODMAP diet   Loralee Pacas, MD  04/17/2022 8:07 AM

## 2022-04-29 ENCOUNTER — Encounter: Payer: Self-pay | Admitting: Family Medicine

## 2022-04-29 ENCOUNTER — Ambulatory Visit (INDEPENDENT_AMBULATORY_CARE_PROVIDER_SITE_OTHER): Payer: Medicare HMO | Admitting: Family Medicine

## 2022-04-29 VITALS — BP 100/60 | HR 72 | Temp 97.7°F | Ht 68.0 in | Wt 202.4 lb

## 2022-04-29 DIAGNOSIS — R197 Diarrhea, unspecified: Secondary | ICD-10-CM | POA: Diagnosis not present

## 2022-04-29 LAB — SEDIMENTATION RATE: Sed Rate: 11 mm/hr (ref 0–20)

## 2022-04-29 NOTE — Patient Instructions (Signed)
Please follow up as scheduled for your next visit with me: 08/12/2022   If you have any questions or concerns, please don't hesitate to send me a message via MyChart or call the office at [phone]. Thank you for visiting with [place] today! It's our pleasure caring for you.   Continue immodium as needed.  Drink plenty of fluids: propel is another option.  If we can get this better, will need to see GI again for their input.   Diarrhea, Adult Diarrhea is frequent loose and sometimes watery bowel movements. Diarrhea can make you feel weak and cause you to become dehydrated. Dehydration is a condition in which there is not enough water or other fluids in the body. Dehydration can make you tired and thirsty, cause you to have a dry mouth, and decrease how often you urinate. Diarrhea typically lasts 2-3 days. However, it can last longer if it is a sign of something more serious. It is important to treat your diarrhea as told by your health care provider. Follow these instructions at home: Eating and drinking     Follow these recommendations as told by your health care provider: Take an oral rehydration solution (ORS). This is an over-the-counter medicine that helps return your body to its normal balance of nutrients and water. It is found at pharmacies and retail stores. Drink enough fluid to keep your urine pale yellow. Drink fluids such as water, diluted fruit juice, and low-calorie sports drinks. You can drink milk also, if desired. Sucking on ice chips is another way to get fluids. Avoid drinking fluids that contain a lot of sugar or caffeine, such as soda, energy drinks, and regular sports drinks. Avoid alcohol. Eat bland, easy-to-digest foods in small amounts as you are able. These foods include bananas, applesauce, rice, lean meats, toast, and crackers. Avoid spicy or fatty foods.  Medicines Take over-the-counter and prescription medicines only as told by your health care provider. If you  were prescribed antibiotics, take them as told by your health care provider. Do not stop using the antibiotic even if you start to feel better. General instructions  Wash your hands often using soap and water for at least 20 seconds. If soap and water are not available, use hand sanitizer. Others in the household should wash their hands as well. Hands should be washed: After using the toilet or changing a diaper. Before preparing, cooking, or serving food. While caring for a sick person or while visiting someone in a hospital. Rest at home while you recover. Take a warm bath to relieve any burning or pain from frequent diarrhea episodes. Watch your condition for any changes. Contact a health care provider if: You have a fever. Your diarrhea gets worse. You have new symptoms. You vomit every time you eat or drink. You feel light-headed, dizzy, or have a headache. You have muscle cramps. You have signs of dehydration, such as: Dark urine, very little urine, or no urine. Cracked lips. Dry mouth. Sunken eyes. Sleepiness. Weakness. You have bloody or black stools or stools that look like tar. You have severe pain, cramping, or bloating in your abdomen. Your skin feels cold and clammy. You feel confused. Get help right away if: You have chest pain or your heart is beating very quickly. You have trouble breathing or you are breathing very quickly. You feel extremely weak or you faint. These symptoms may be an emergency. Get help right away. Call 911. Do not wait to see if the symptoms will go  away. Do not drive yourself to the hospital. This information is not intended to replace advice given to you by your health care provider. Make sure you discuss any questions you have with your health care provider. Document Revised: 11/11/2021 Document Reviewed: 11/11/2021 Elsevier Patient Education  [place].

## 2022-04-29 NOTE — Progress Notes (Unsigned)
Subjective  CC:  Chief Complaint  Patient presents with   Diarrhea    Pt stated that he has ben having Diarrhea for the past 2 weeks.     HPI: Caleb Gibson is a 81 y.o. male who presents to the office today to address the problems listed above in the chief complaint. 81 w/ parkinsons, anxiety and idiopathic hypotension presents for fu on diarrhea; I reviewed acute care visit from 2 weeks ago;  Acute onset watery diarrhea w/o mucous or blood associated with anxiety related to upcoming travel. No abd pain or fevers but did have some nausea which is not unusual for him. No vomiting. Eval here in office w/ benign abdomen and unremarkable cbc, tsh mag and cmp. Stool studies were not done.  No recent travel or antibiotic use. No new meds; cinemet started in August.  Immodium has been helpful but frequent Bms persist.  Had recent screening unremarkable colonoscopy.   Assessment  1. Diarrhea, unspecified type      Plan  diarrhea:  r/o infection w/ GI pathogen panel. Mild w/o sxs or signs of hypotension. Keep hydrated, continue immodium prn. If not improving and neg infection, will need GI eval again.   Follow up: prn  08/12/2022  Orders Placed This Encounter  Procedures   Gastrointestinal Pathogen Pnl RT, PCR   Sedimentation rate   No orders of the defined types were placed in this encounter.     I reviewed the patients updated PMH, FH, and SocHx.    Patient Active Problem List   Diagnosis Date Noted   Mild neurocognitive disorder 12/19/2020    Priority: High   History of prostate cancer 11/22/2017    Priority: High   Tubular adenoma of colon 06/15/2017    Priority: High   On statin therapy due to risk of future cardiovascular event 10/08/2015    Priority: High   Mixed hyperlipidemia 09/27/2014    Priority: High   History of traumatic head injury     Priority: Medium    Idiopathic hypotension 01/07/2016    Priority: Medium    BPH (benign prostatic hyperplasia) 10/01/2011     Priority: Medium    Erectile dysfunction 10/01/2011    Priority: Medium    Osteoarthritis, knee 08/01/2010    Priority: Medium    Claustrophobia 07/08/2009    Priority: Medium    Basal cell carcinoma of skin 06/15/2017    Priority: Low   Simple cyst of kidney 11/12/2011    Priority: Low   Allergic rhinitis 08/27/2011    Priority: Low   Parkinsonism 09/09/2021   GAD (generalized anxiety disorder) 08/06/2021   REM behavioral disorder    No outpatient medications have been marked as taking for the 04/29/22 encounter (Office Visit) with [name], MD.    Allergies: Patient is allergic to viagra [sildenafil citrate]. Family History: Patient family history includes Alcohol abuse in his maternal aunt and mother; Cancer in his maternal grandfather and maternal grandmother; Cirrhosis in his maternal aunt; Diabetes in his father; Glaucoma in his father; Heart disease in his maternal grandmother; Lymphoma in his mother; Melanoma in his mother; Mental illness in his mother; Prostate cancer in his father. Social History:  Patient  reports that he quit smoking about 27 years ago. His smoking use included cigarettes. He has never used smokeless tobacco. He reports current alcohol use of about 2.0 - 5.0 standard drinks of alcohol per week. He reports that he does not use drugs.  Review of Systems:  Constitutional: Negative for fever malaise or anorexia Cardiovascular: negative for chest pain Respiratory: negative for SOB or persistent cough Gastrointestinal: negative for abdominal pain  Objective  Vitals: BP 100/60   Pulse 72   Temp 97.7 F (36.5 C)   Ht '5\' 8"'$  (1.727 m)   Wt 202 lb 6.4 oz (91.8 kg)   SpO2 96%   BMI 30.77 kg/m  General: no acute distress , A&Ox3 HEENT: PEERL, conjunctiva normal, neck is supple Cardiovascular:  RRR without murmur or gallop.  Respiratory:  Good breath sounds bilaterally, CTAB with normal respiratory effort Abd: soft, mildly increased bowel  sounds, nontender w/o masses Skin:  Warm, no rashes  Office Visit on 04/29/2022  Component Date Value Ref Range Status   Sed Rate 04/29/2022 11  0 - 20 mm/hr Final   Lab Results  Component Value Date   CREATININE 0.90 04/16/2022   BUN 15 04/16/2022   NA 141 04/16/2022   K 3.9 04/16/2022   CL 108 04/16/2022   CO2 26 04/16/2022   Lab Results  Component Value Date   ALT 4 04/16/2022   AST 10 04/16/2022   ALKPHOS 77 04/16/2022   BILITOT 0.5 04/16/2022   Lab Results  Component Value Date   WBC 4.8 04/16/2022   HGB 11.9 (L) 04/16/2022   HCT 34.6 (L) 04/16/2022   MCV 92.8 04/16/2022   PLT 191.0 04/16/2022   Lab Results  Component Value Date   TSH 1.11 04/16/2022       Commons side effects, risks, benefits, and alternatives for medications and treatment plan prescribed today were discussed, and the patient expressed understanding of the given instructions. Patient is instructed to call or message via MyChart if he/she has any questions or concerns regarding our treatment plan. No barriers to understanding were identified. We discussed Red Flag symptoms and signs in detail. Patient expressed understanding regarding what to do in case of urgent or emergency type symptoms.  Medication list was reconciled, printed and provided to the patient in AVS. Patient instructions and summary information was reviewed with the patient as documented in the AVS. This note was prepared with assistance of Dragon voice recognition software. Occasional wrong-word or sound-a-like substitutions may have occurred due to the inherent limitations of voice recognition software  This visit occurred during the SARS-CoV-2 public health emergency.  Safety protocols were in place, including screening questions prior to the visit, additional usage of staff PPE, and extensive cleaning of exam room while observing appropriate contact time as indicated for disinfecting solutions.

## 2022-05-02 LAB — GASTROINTESTINAL PATHOGEN PNL
CampyloBacter Group: NOT DETECTED
Norovirus GI/GII: NOT DETECTED
Rotavirus A: NOT DETECTED
Salmonella species: NOT DETECTED
Shiga Toxin 1: NOT DETECTED
Shiga Toxin 2: NOT DETECTED
Shigella Species: NOT DETECTED
Vibrio Group: NOT DETECTED
Yersinia enterocolitica: NOT DETECTED

## 2022-05-04 ENCOUNTER — Other Ambulatory Visit: Payer: Self-pay

## 2022-05-04 DIAGNOSIS — R197 Diarrhea, unspecified: Secondary | ICD-10-CM

## 2022-05-04 NOTE — Progress Notes (Signed)
Please call patient: I have reviewed his/her lab results. His stool studies are all negative but C.diff was not included. If still with diarrhea, we  need to get this test done. I have ordered it. Can clarify with lab if he can collect in office or not?  And recommend having him call GI to get an appointment for further evaluation as well.

## 2022-05-05 DIAGNOSIS — R197 Diarrhea, unspecified: Secondary | ICD-10-CM | POA: Diagnosis not present

## 2022-05-07 ENCOUNTER — Telehealth: Payer: Self-pay | Admitting: Family Medicine

## 2022-05-07 NOTE — Telephone Encounter (Signed)
Patient called to check if lab results have come in. Informed patient that results have not all been resulted but once this occurs, PCP's nurse will be able to call with necessary next steps. Pt verbalized understanding and is awaiting this call.

## 2022-05-08 NOTE — Telephone Encounter (Signed)
Patient requests to be called asap to be given lab results.

## 2022-05-09 LAB — CLOSTRIDIUM DIFFICILE BY PCR: Toxigenic C. Difficile by PCR: NEGATIVE

## 2022-05-10 NOTE — Progress Notes (Signed)
Please call patient: I have reviewed his/her lab results. C.diff testing was also negative. If diarrhea persists, please have him schedule to see his GI doctor for further assessment.

## 2022-05-11 ENCOUNTER — Telehealth: Payer: Self-pay | Admitting: Gastroenterology

## 2022-05-11 NOTE — Telephone Encounter (Signed)
Spoke with pt's wife regarding lab results/recommendations

## 2022-05-11 NOTE — Telephone Encounter (Signed)
Lm on vm for patient's wife to return call. 

## 2022-05-11 NOTE — Telephone Encounter (Signed)
Pt's wife returned call. She states that pt has intermittent diarrhea and he only take Imodium PRN. We rescheduled his appt to Friday, 05/15/22 at 8:30 am. Pt's wife verbalized understanding and had no concerns at the end of the call.

## 2022-05-11 NOTE — Telephone Encounter (Signed)
Inbound call from patient wife stating patient has had ongoing diarrhea. His pcp did labs but found nothing linking to his symptoms. Made an appt with Dr.Armbruster on 07/06/22 but states that is a long time to wait with having diarrhea. She is requesting a call back.

## 2022-05-14 DIAGNOSIS — F324 Major depressive disorder, single episode, in partial remission: Secondary | ICD-10-CM | POA: Diagnosis not present

## 2022-05-14 DIAGNOSIS — F419 Anxiety disorder, unspecified: Secondary | ICD-10-CM | POA: Diagnosis not present

## 2022-05-15 ENCOUNTER — Ambulatory Visit: Payer: Medicare HMO | Admitting: Gastroenterology

## 2022-05-15 ENCOUNTER — Encounter: Payer: Self-pay | Admitting: Gastroenterology

## 2022-05-15 VITALS — BP 102/65 | HR 82 | Ht 68.0 in

## 2022-05-15 DIAGNOSIS — Z791 Long term (current) use of non-steroidal anti-inflammatories (NSAID): Secondary | ICD-10-CM | POA: Diagnosis not present

## 2022-05-15 DIAGNOSIS — R194 Change in bowel habit: Secondary | ICD-10-CM

## 2022-05-15 MED ORDER — CITRUCEL PO POWD: 1.0000 | Freq: Every day | ORAL | Status: DC

## 2022-05-15 NOTE — Patient Instructions (Addendum)
If you are age 82 or older, your body mass index should be between 23-30. Your Body mass index is 30.77 kg/m. If this is out of the aforementioned range listed, please consider follow up with your Primary Care Provider.  If you are age 36 or younger, your body mass index should be between 19-25. Your Body mass index is 30.77 kg/m. If this is out of the aformentioned range listed, please consider follow up with your Primary Care Provider.   ___________________________________________________  Stop probiotic  Please purchase the following medications over the counter and take as directed: Citrucel- once daily  Minimize your NSAID use.  Use Tylenol instead.  Thank you for entrusting me with your care and for choosing [facility], Dr. Cheval Cellar

## 2022-05-15 NOTE — Progress Notes (Signed)
HPI :  81 year old male with a history of colon polyps, prostate cancer status post XRT, parkinsonism, history of colon polyps, here for follow-up visit for change in bowel habits.  Recall I performed his colonoscopy in September of this year.  He had a few small polyps removed, diverticulosis, a few benign-appearing AVMs in the distal rectum.  He reports in early November he had some bowel changes where he would have 3-4 loose bowels per day which were watery and bothersome to him.  He did not have any significant urgency but he did have a warning to get to the bathroom in time.  His wife reports he was rather stressed about a pending trip to [place] and wonders if this could be related to his symptoms.  Symptoms lasted a few weeks and then he decided to not go on the trip to [place].  After he made that decision he states his symptoms resolved.  During this course he was evaluated his primary care, had lab work done, stool studies, no evidence of infection.  No anemia, no leukocytosis.  He denied any blood in his stools during this time.  No nausea or vomiting.  Denied any sick contacts.  He denied any recent changes to his medication regimen around this time.  Sinemet dosing has been stable.  He has been using Aleve most days of the week for headaches, we discussed that as below.  Zoloft has been on for over a year.  Wife asked about ongoing probiotic use which he takes for his general health for some time.  In the past few weeks he has been feeling better and bowels getting back to normal.   Colonoscopy 02/20/22: - One 5 mm polyp in the cecum, removed with a cold snare. Resected and retrieved. - Three diminutive polyps in the transverse colon, removed with a cold snare. Resected and retrieved. - One diminutive polyp at the splenic flexure, removed with a cold snare. Resected and retrieved. - One 3 mm polyp at the recto-sigmoid colon, removed with a cold snare. Resected and retrieved. - One 3  mm polyp in the rectum, removed with a cold snare. Resected and retrieved. - Diverticulosis in the sigmoid colon. - Internal hemorrhoids. - A few colonic angiodysplastic lesions. - The examination was otherwise normal.  1. Surgical [P], colon, cecum, polyp (1) - POLYPOID COLONIC MUCOSA WITH A PROMINENT LYMPHOID AGGREGATE - NEGATIVE FOR DYSPLASIA 2. Surgical [P], colon, rectum, splenic flexure, and transverse, polyp (6) - TUBULAR ADENOMA(S) WITHOUT HIGH-GRADE DYSPLASIA OR MALIGNANCY - SESSILE SERRATED POLYP(S) WITHOUT CYTOLOGIC DYSPLASIA  Workup for diarrhea C diff GI pathogen panel TSH CBC normal  Past Medical History:  Diagnosis Date   Allergic rhinitis 08/27/2011   Anxiety    Basal cell carcinoma of skin    BPH (benign prostatic hyperplasia) 10/01/2011   Had urology work up; psa normal   Claustrophobia    GAD (generalized anxiety disorder) 08/06/2021   History of prostate cancer 11/22/2017   dxd 11/2017: active surveillance then rads tx 2020   History of traumatic head injury    Idiopathic hypotension 01/07/2016    Normal stress Echo - 12/2014 Neg adrenal insufficiency work up by endocrine 2017  Formatting of this note might be different from the original. Normal stress Echo - 12/2014   Mild neurocognitive disorder 12/19/2020   Mixed hyperlipidemia 09/27/2014   Night terrors    Osteoarthritis, knee 08/01/2010   Parkinsonism 09/09/2021   REM behavioral disorder    Simple cyst of  kidney 11/12/2011   Overview:  Stable on CT scan/ no further imaging or work up needed/cla  Formatting of this note might be different from the original. Stable on CT scan/ no further imaging or work up needed/cla   Tubular adenoma of colon 06/15/2017   Colonoscopy 08/2016; rec repeat in 5 years.      Past Surgical History:  Procedure Laterality Date   APPENDECTOMY     DENTAL SURGERY     Had a tooth pulled in 01/2017   KNEE ARTHROSCOPY WITH MENISCAL REPAIR Right    MOHS SURGERY     RHINOPLASTY      TONSILLECTOMY AND ADENOIDECTOMY     Family History  Problem Relation Age of Onset   Alcohol abuse Mother    Melanoma Mother    Lymphoma Mother    Mental illness Mother    Prostate cancer Father    Diabetes Father    Glaucoma Father    Heart disease Maternal Grandmother    Cancer Maternal Grandmother        type unknown   Cancer Maternal Grandfather        type unknown   Alcohol abuse Maternal Aunt    Cirrhosis Maternal Aunt    Social History   Tobacco Use   Smoking status: Former    Types: Cigarettes    Quit date: 1996    Years since quitting: 27.9   Smokeless tobacco: Never  Vaping Use   Vaping Use: Never used  Substance Use Topics   Alcohol use: Yes    Alcohol/week: 2.0 - 5.0 standard drinks of alcohol    Types: 2 - 5 Cans of beer per week    Comment: 2 beers/week (prior heavier)   Drug use: No   Current Outpatient Medications  Medication Sig Dispense Refill   ALPRAZolam (XANAX) 0.5 MG tablet Take 1 tablet (0.5 mg total) by mouth daily as needed for anxiety. 30 tablet 0   carbidopa-levodopa (SINEMET IR) 25-100 MG tablet Take 1 tablet by mouth 3 (three) times daily. 7am/11am/4pm 270 tablet 1   cholecalciferol (VITAMIN D) 1000 units tablet Take 1,000 Units by mouth daily.      donepezil (ARICEPT) 10 MG tablet Take 1 tablet (10 mg total) by mouth daily. 90 tablet 4   Fluticasone Propionate (FLONASE NA) Place into the nose.     memantine (NAMENDA) 10 MG tablet Take 1 tablet (10 mg total) by mouth 2 (two) times daily. 180 tablet 4   Multiple Vitamins-Minerals (CENTRUM SILVER) tablet Take 1 tablet by mouth daily.      OPTIVE 0.5-0.9 % ophthalmic solution Place 1 drop into the left eye 2 (two) times daily.     rosuvastatin (CRESTOR) 5 MG tablet TAKE 1/2 TABLET EVERY DAY 45 tablet 3   sertraline (ZOLOFT) 50 MG tablet Take 50 mg by mouth daily.     tamsulosin (FLOMAX) 0.4 MG CAPS capsule Take 0.4 mg by mouth.     methylphenidate (RITALIN) 5 MG tablet Take 5 mg by mouth  every morning. (Patient not taking: Reported on 05/15/2022)     No current facility-administered medications for this visit.   Allergies  Allergen Reactions   Viagra [Sildenafil Citrate] Other (See Comments)    Hallucinations      Review of Systems: All systems reviewed and negative except where noted in HPI.   Lab Results  Component Value Date   WBC 4.8 04/16/2022   HGB 11.9 (L) 04/16/2022   HCT 34.6 (L) 04/16/2022  MCV 92.8 04/16/2022   PLT 191.0 04/16/2022    Lab Results  Component Value Date   CREATININE 0.90 04/16/2022   BUN 15 04/16/2022   NA 141 04/16/2022   K 3.9 04/16/2022   CL 108 04/16/2022   CO2 26 04/16/2022    Lab Results  Component Value Date   ALT 4 04/16/2022   AST 10 04/16/2022   ALKPHOS 77 04/16/2022   BILITOT 0.5 04/16/2022     Physical Exam: BP 102/65   Pulse 82   Ht '5\' 8"'$  (1.727 m)   BMI 30.77 kg/m  Constitutional: Pleasant,well-developed, male in no acute distress. Neurological: Alert and oriented to person place and time. Psychiatric: Normal mood and affect. Behavior is normal.   ASSESSMENT: 81 y.o. male here for assessment of the following  1. Change in bowel habits   2. NSAID long-term use    History as above, new onset change in bowel habits with diarrhea, had a negative infectious workup.  Wife thinks this was due to stress while planning for a trip, after they decided to cancel the trip, his symptoms have significantly improved now getting back to normal.  Given he is feeling well at this time do not feel we need to pursue any further evaluation.  We discussed how emotional responses can affect bowel habits and that is possible.  His colonoscopy is recent and reassuring as well as his recent workup.  He can take some Citrucel to provide some regularity if he needs to moving forward.  We otherwise spent some time discussing his long-term NSAID use.  We discussed he is at risk for PUD, kidney disease, cardiovascular issues  with long-term use of NSAIDs and recommend he use an alternative such as Tylenol for his headaches which is much safer.  They agree with this.  We also discussed his probiotic use, has been on this for a long time, I do not think he needs to take a daily probiotic for his general health.  We had a discussion about this, they we will stop it.   PLAN: - stop probiotic - using for general health daily for some time, do not see any indication for it - start Citrucel once daily PRN - stop using routine NSAIDs - switch to tylenol if using routinely - f/u PRN, no further colonoscopy recommended  Jolly Mango, MD [facility]

## 2022-05-21 DIAGNOSIS — F324 Major depressive disorder, single episode, in partial remission: Secondary | ICD-10-CM | POA: Diagnosis not present

## 2022-05-21 DIAGNOSIS — F419 Anxiety disorder, unspecified: Secondary | ICD-10-CM | POA: Diagnosis not present

## 2022-06-04 DIAGNOSIS — F324 Major depressive disorder, single episode, in partial remission: Secondary | ICD-10-CM | POA: Diagnosis not present

## 2022-06-04 DIAGNOSIS — F419 Anxiety disorder, unspecified: Secondary | ICD-10-CM | POA: Diagnosis not present

## 2022-06-09 DIAGNOSIS — D225 Melanocytic nevi of trunk: Secondary | ICD-10-CM | POA: Diagnosis not present

## 2022-06-09 DIAGNOSIS — C4442 Squamous cell carcinoma of skin of scalp and neck: Secondary | ICD-10-CM | POA: Diagnosis not present

## 2022-06-09 DIAGNOSIS — L820 Inflamed seborrheic keratosis: Secondary | ICD-10-CM | POA: Diagnosis not present

## 2022-06-09 DIAGNOSIS — Z85828 Personal history of other malignant neoplasm of skin: Secondary | ICD-10-CM | POA: Diagnosis not present

## 2022-06-09 DIAGNOSIS — D2262 Melanocytic nevi of left upper limb, including shoulder: Secondary | ICD-10-CM | POA: Diagnosis not present

## 2022-06-09 DIAGNOSIS — L821 Other seborrheic keratosis: Secondary | ICD-10-CM | POA: Diagnosis not present

## 2022-06-09 DIAGNOSIS — D2261 Melanocytic nevi of right upper limb, including shoulder: Secondary | ICD-10-CM | POA: Diagnosis not present

## 2022-06-09 DIAGNOSIS — L570 Actinic keratosis: Secondary | ICD-10-CM | POA: Diagnosis not present

## 2022-06-09 DIAGNOSIS — D485 Neoplasm of uncertain behavior of skin: Secondary | ICD-10-CM | POA: Diagnosis not present

## 2022-06-23 DIAGNOSIS — H353131 Nonexudative age-related macular degeneration, bilateral, early dry stage: Secondary | ICD-10-CM | POA: Diagnosis not present

## 2022-06-23 DIAGNOSIS — H40013 Open angle with borderline findings, low risk, bilateral: Secondary | ICD-10-CM | POA: Diagnosis not present

## 2022-06-23 DIAGNOSIS — H18513 Endothelial corneal dystrophy, bilateral: Secondary | ICD-10-CM | POA: Diagnosis not present

## 2022-06-23 DIAGNOSIS — H35412 Lattice degeneration of retina, left eye: Secondary | ICD-10-CM | POA: Diagnosis not present

## 2022-06-23 DIAGNOSIS — Z961 Presence of intraocular lens: Secondary | ICD-10-CM | POA: Diagnosis not present

## 2022-06-23 DIAGNOSIS — H538 Other visual disturbances: Secondary | ICD-10-CM | POA: Diagnosis not present

## 2022-07-06 ENCOUNTER — Telehealth: Payer: Self-pay | Admitting: Neurology

## 2022-07-06 ENCOUNTER — Ambulatory Visit: Payer: Medicare HMO | Admitting: Gastroenterology

## 2022-07-06 MED ORDER — CARBIDOPA-LEVODOPA 25-100 MG PO TABS: 1.0000 | ORAL_TABLET | Freq: Three times a day (TID) | ORAL | 1 refills | Status: DC

## 2022-07-06 NOTE — Telephone Encounter (Signed)
Pt wife is calling. Requesting a refill on carbidopa-levodopa (SINEMET IR) 25-100 MG tablet. She is requesting it be sent to [facility].

## 2022-07-07 DIAGNOSIS — F419 Anxiety disorder, unspecified: Secondary | ICD-10-CM | POA: Diagnosis not present

## 2022-07-07 DIAGNOSIS — F324 Major depressive disorder, single episode, in partial remission: Secondary | ICD-10-CM | POA: Diagnosis not present

## 2022-07-16 IMAGING — NM NM DATSCAN
3 series · 28 of 35 positions shown · non-contrast
Comparison: None Available.

CLINICAL DATA: 80-year-old male gradual onset memory loss.

EXAM:
NUCLEAR MEDICINE BRAIN IMAGING WITH SPECT  (DaTscan )
TECHNIQUE: SPECT images of the brain were obtained after intravenous injection
of radiopharmaceutical. 4 hour post injection imaging. Appropriate
positioning.
130 mg TOLLE STAT given orally for thyroid blockade.
RADIOPHARMACEUTICALS:  4.3 millicuries I 123 Ioflupane

[Series 1: dat scan · 4.14mm/px · 5 of 120 frames shown]
[frame 11/120  full-range]
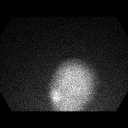
[frame 31/120  full-range]
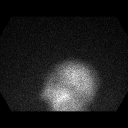
[frame 71/120  full-range]
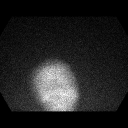
[frame 91/120  full-range]
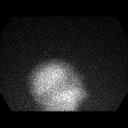
[frame 111/120  full-range]
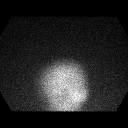

[Series 7: datquant results · 4.1mm · 4.14mm/px · 5 of 128 frames shown]
[frame 11/128]
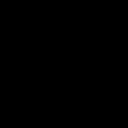
[frame 54/128  full-range]
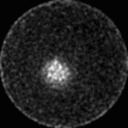
[frame 75/128  full-range]
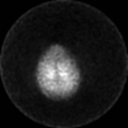
[frame 96/128  full-range]
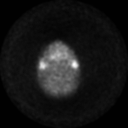
[frame 118/128  full-range]
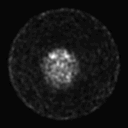

[Series 1025: exported datquant results · 4.1mm · 1.04mm/px · 18 of 128 slices shown]
[im 6/128]
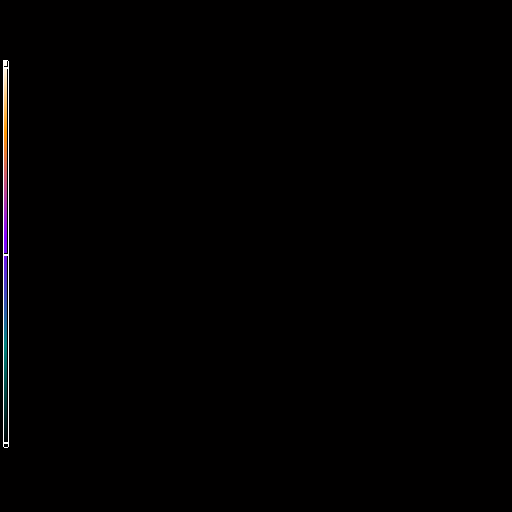
[im 12/128]
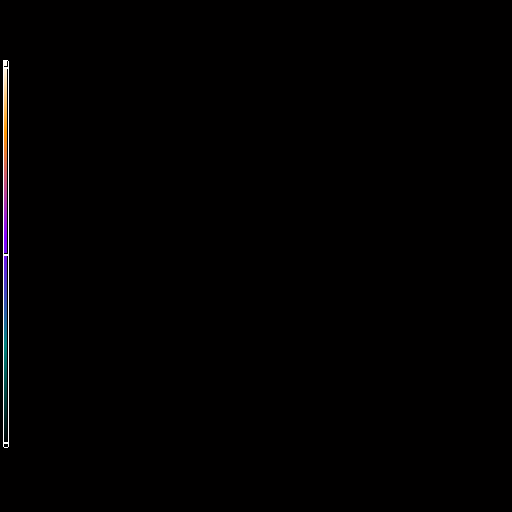
[im 18/128]
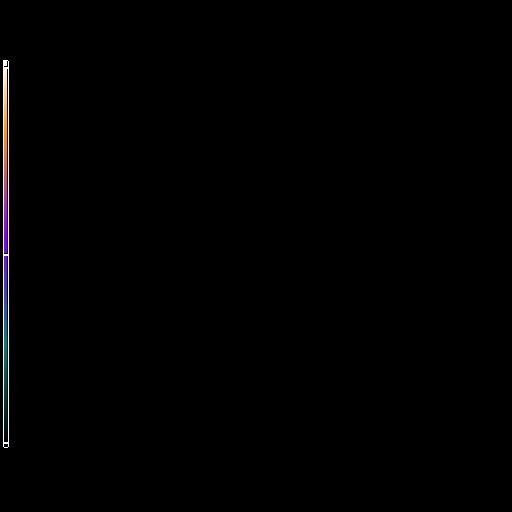
[im 24/128]
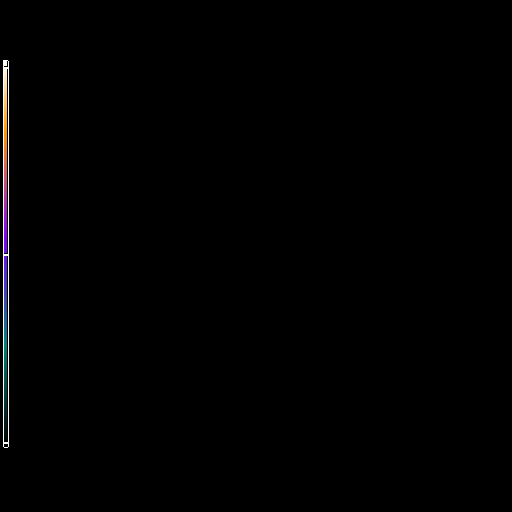
[im 35/128]
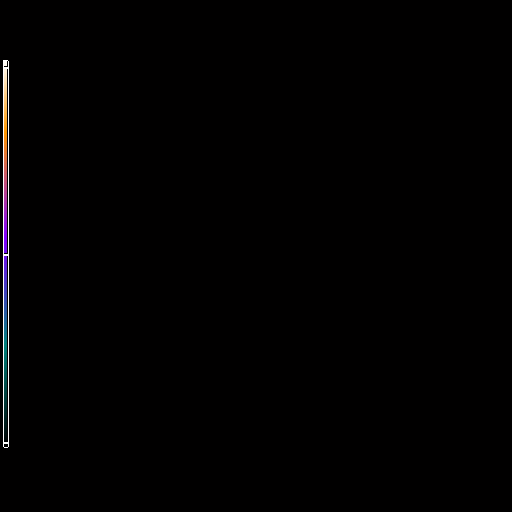
[im 41/128]
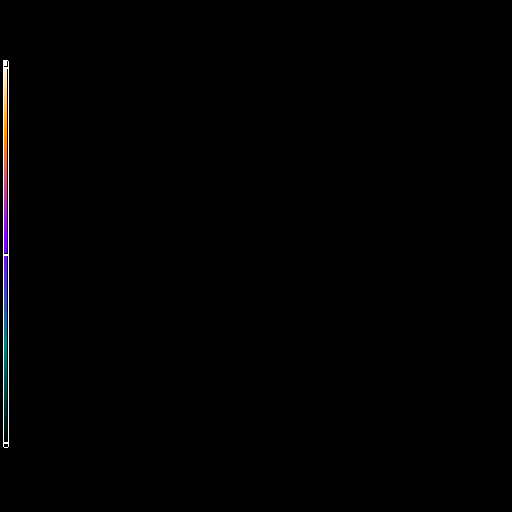
[im 47/128]
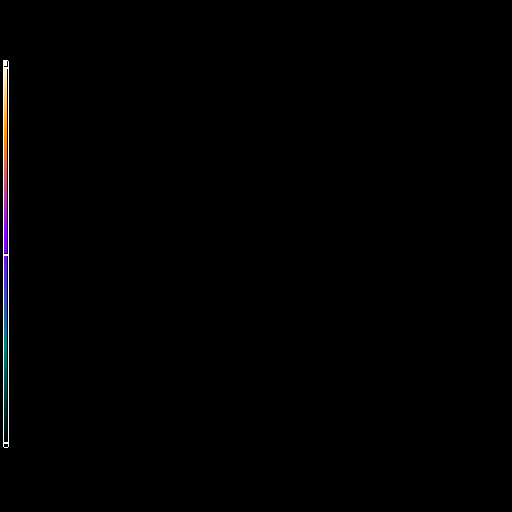
[im 52/128]
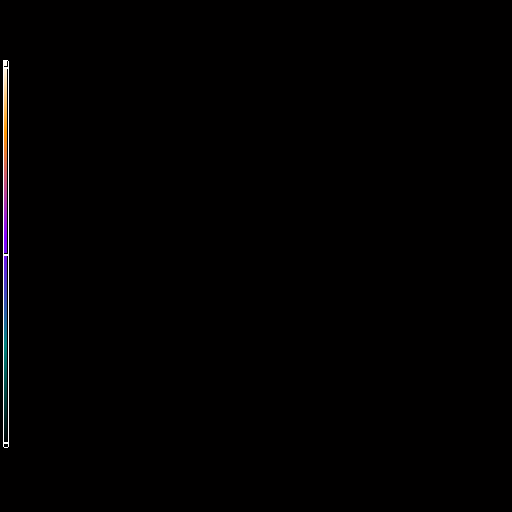
[im 64/128]
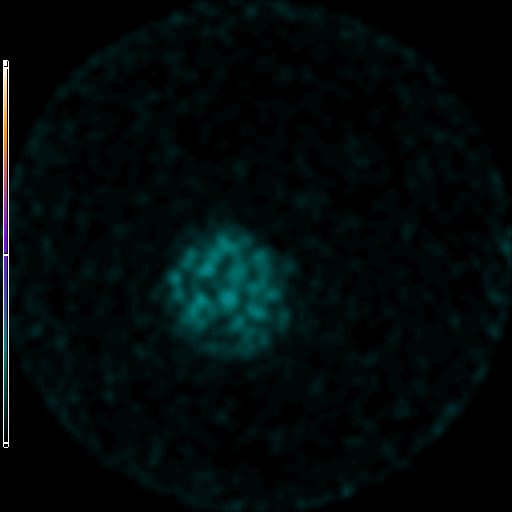
[im 70/128]
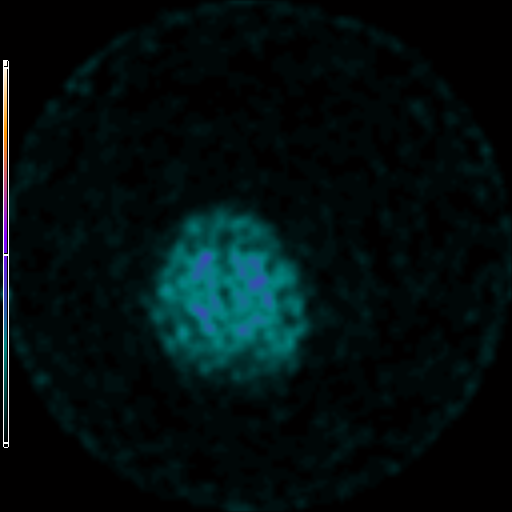
[im 76/128]
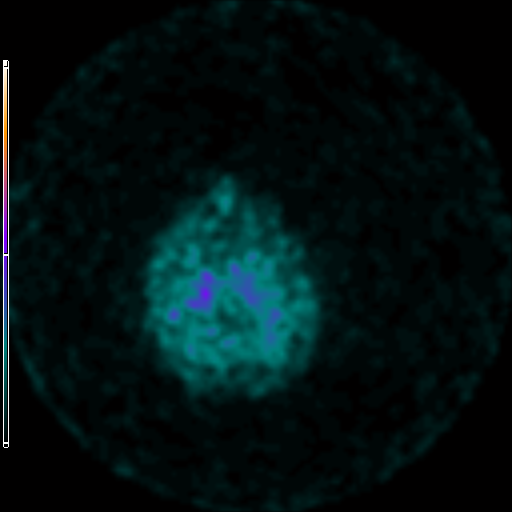
[im 81/128]
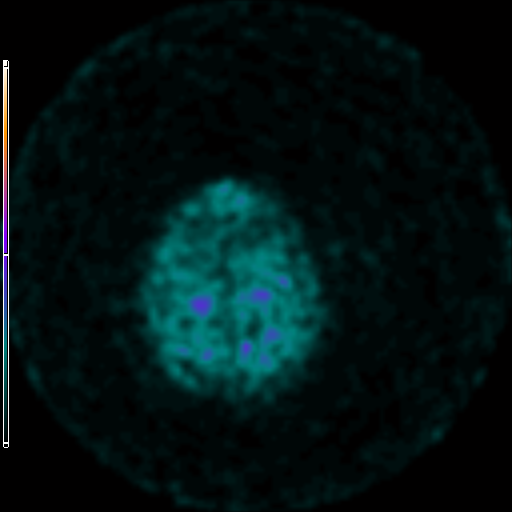
[im 93/128]
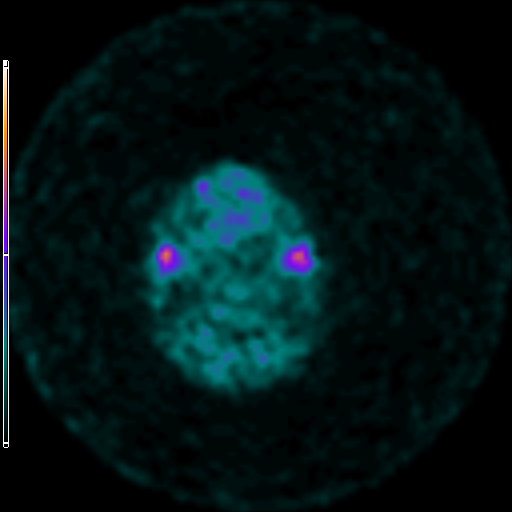
[im 99/128]
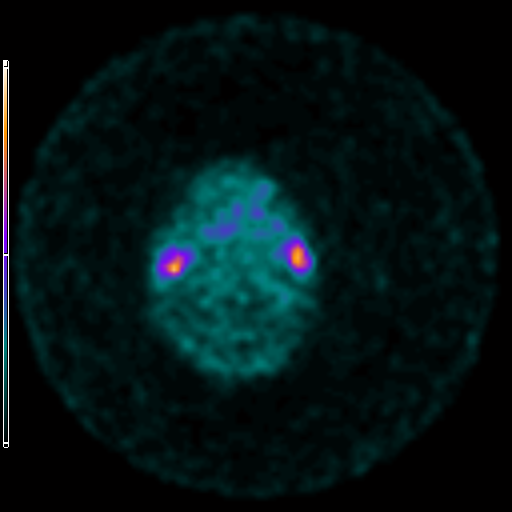
[im 104/128]
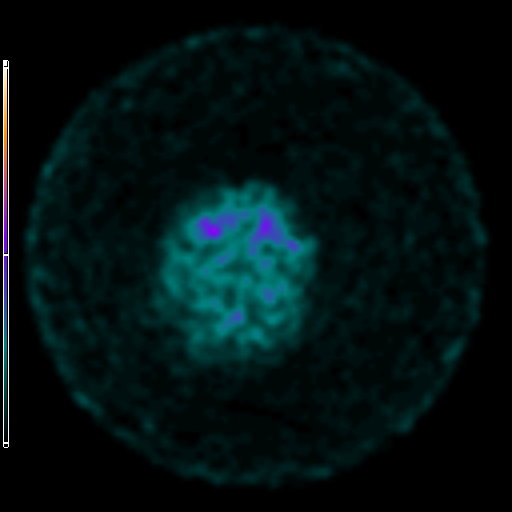
[im 110/128]
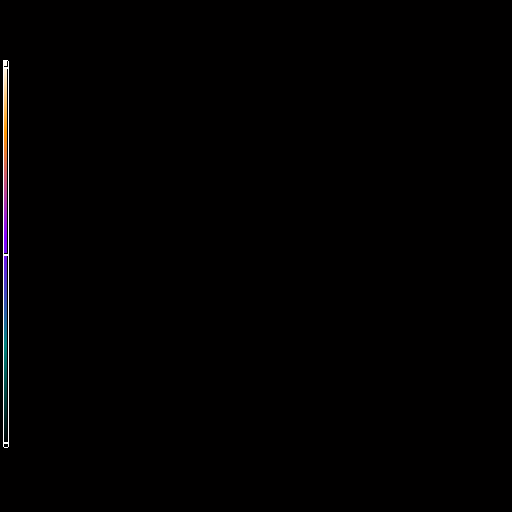
[im 122/128]
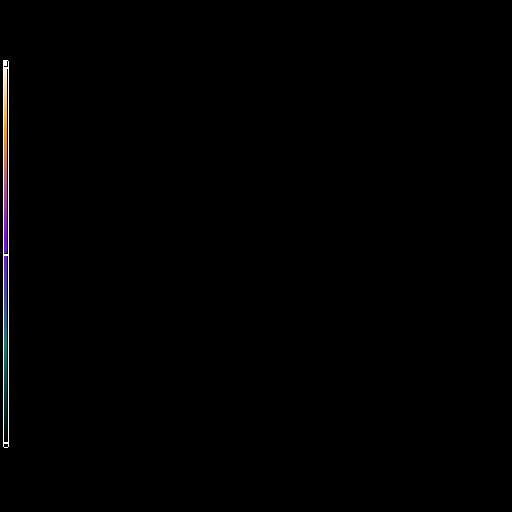
[im 128/128]
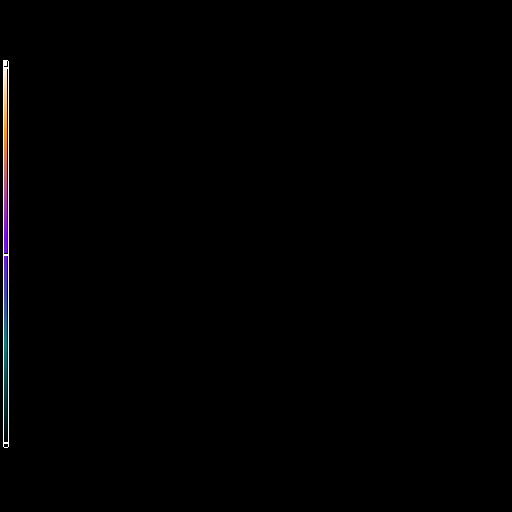

[28 of 35 positions shown; findings below may reference images not displayed]

FINDINGS: There is marked decreased radiotracer activity within both the LEFT
and RIGHT striatum. Decreased activity involves both the heads of
caudate nuclei and putamen.
IMPRESSION: Symmetric decreased striatal Ioflupane activity as above. This
pattern can be seen in Parkinsonian syndromes.

Of note, DaTSCAN is not diagnostic of Parkinsonian syndromes, which
remains a clinical diagnosis. DaTscan is an adjuvant test to aid in
the clinical diagnosis of Parkinsonian syndromes.

## 2022-07-29 DIAGNOSIS — C61 Malignant neoplasm of prostate: Secondary | ICD-10-CM | POA: Diagnosis not present

## 2022-07-30 ENCOUNTER — Telehealth: Payer: Self-pay | Admitting: Pharmacist

## 2022-07-30 NOTE — Progress Notes (Signed)
Care Management & Coordination Services Pharmacy Team  Reason for Encounter: General adherence update   Contacted patient for general health update and medication adherence call.  Spoke with  spouse  on 08/04/2022    What concerns do you have about your medications?  The patient denies side effects with their medications.   How often do you forget or accidentally miss a dose? Never  Do you use a pillbox? Yes  Are you having any problems getting your medications from your pharmacy? No  Has the cost of your medications been a concern? No If yes, what medication and is patient assistance available or has it been applied for?  Since last visit with PharmD, no interventions have been made.   The patient has not had an ED visit since last contact.   The patient denies problems with their health.   Patient denies concerns or questions for Leata Mouse, PharmD at this time.   Counseled patient on: Access to carecoordination team for any cost, medication or pharmacy concerns.   Chart Updates:  Recent office visits:  04/29/2022 OV (PCP) [name], MD; continue immodium prn.   04/16/2022 OV (Fam Med) Loralee Pacas, MD; I recommend stopping the current probiotic   Recent consult visits:  05/15/2022 OV Gertie Fey) Armbruster, Carlota Raspberry, MD; - stop probiotic - using for general health daily for some time, do not see any indication for it - start Citrucel once daily PRN - stop using routine NSAIDs - switch to tylenol if using routinely  03/16/2022 OV (Neurology) Marcial Pacas, MD; no medication changes indicated.  02/03/2022 OV (Neurology) Tat, Eustace Quail, DO; -We decided to add carbidopa/levodopa 25/100. 1/2 tab tid x 1 wk, then 1/2 in am & noon & 1 at night for a week, then 1/2 in am &1 at noon &night for a week, then 1 po tid   Hospital visits:  None in previous 6 months  Medications: Outpatient Encounter Medications as of 07/30/2022  Medication Sig   ALPRAZolam (XANAX) 0.5 MG  tablet Take 1 tablet (0.5 mg total) by mouth daily as needed for anxiety.   carbidopa-levodopa (SINEMET IR) 25-100 MG tablet Take 1 tablet by mouth 3 (three) times daily. 7am/11am/4pm   cholecalciferol (VITAMIN D) 1000 units tablet Take 1,000 Units by mouth daily.    donepezil (ARICEPT) 10 MG tablet Take 1 tablet (10 mg total) by mouth daily.   Fluticasone Propionate (FLONASE NA) Place into the nose.   memantine (NAMENDA) 10 MG tablet Take 1 tablet (10 mg total) by mouth 2 (two) times daily.   methylcellulose (CITRUCEL) oral powder Take 1 packet by mouth daily.   methylphenidate (RITALIN) 5 MG tablet Take 5 mg by mouth every morning. (Patient not taking: Reported on 05/15/2022)   Multiple Vitamins-Minerals (CENTRUM SILVER) tablet Take 1 tablet by mouth daily.    OPTIVE 0.5-0.9 % ophthalmic solution Place 1 drop into the left eye 2 (two) times daily.   rosuvastatin (CRESTOR) 5 MG tablet TAKE 1/2 TABLET EVERY DAY   sertraline (ZOLOFT) 50 MG tablet Take 50 mg by mouth daily.   tamsulosin (FLOMAX) 0.4 MG CAPS capsule Take 0.4 mg by mouth.   No facility-administered encounter medications on file as of 07/30/2022.    Recent vitals BP Readings from Last 3 Encounters:  05/15/22 102/65  04/29/22 100/60  04/16/22 (!) 91/59   Pulse Readings from Last 3 Encounters:  05/15/22 82  04/29/22 72  04/16/22 79   Wt Readings from Last 3 Encounters:  04/29/22 202 lb 6.4 oz (91.8 kg)  04/16/22 205 lb 12.8 oz (93.4 kg)  03/16/22 205 lb (93 kg)   BMI Readings from Last 3 Encounters:  05/15/22 30.77 kg/m  04/29/22 30.77 kg/m  04/16/22 31.29 kg/m    Recent lab results    Component Value Date/Time   NA 141 04/16/2022 1239   K 3.9 04/16/2022 1239   CL 108 04/16/2022 1239   CO2 26 04/16/2022 1239   GLUCOSE 81 04/16/2022 1239   BUN 15 04/16/2022 1239   CREATININE 0.90 04/16/2022 1239   CREATININE 0.95 05/15/2020 1439   CALCIUM 8.5 04/16/2022 1239    Lab Results  Component Value Date    CREATININE 0.90 04/16/2022   GFR 79.82 04/16/2022   GFRNONAA 75 05/15/2020   GFRAA 87 05/15/2020   No results found for: "HGBA1C", "FRUCTOSAMINE", "MICROALBUR"  Lab Results  Component Value Date   CHOL 150 08/06/2021   HDL 70.30 08/06/2021   LDLCALC 71 08/06/2021   TRIG 43.0 08/06/2021   CHOLHDL 2 08/06/2021    Care Gaps: Annual wellness visit in last year? No   Star Rating Drugs:  Rosuvastatin 5 mg last filled 07/08/2022 90 DS  Future Appointments  Date Time Provider [place]  08/12/2022  8:30 AM [name], MD [facility]  09/21/2022 10:00 AM Marcial Pacas, MD [facility] None  12/22/2022  1:00 PM Hazle Coca, PhD [facility] None  12/22/2022  2:00 PM [facility] None  12/29/2022  2:30 PM Hazle Coca, PhD [facility] None     April D Calhoun, [place] Pharmacist Assistant [phone]

## 2022-08-07 DIAGNOSIS — H353121 Nonexudative age-related macular degeneration, left eye, early dry stage: Secondary | ICD-10-CM | POA: Diagnosis not present

## 2022-08-07 DIAGNOSIS — H35361 Drusen (degenerative) of macula, right eye: Secondary | ICD-10-CM | POA: Diagnosis not present

## 2022-08-07 DIAGNOSIS — H59811 Chorioretinal scars after surgery for detachment, right eye: Secondary | ICD-10-CM | POA: Diagnosis not present

## 2022-08-12 ENCOUNTER — Ambulatory Visit (INDEPENDENT_AMBULATORY_CARE_PROVIDER_SITE_OTHER): Payer: Medicare HMO | Admitting: Family Medicine

## 2022-08-12 ENCOUNTER — Encounter: Payer: Self-pay | Admitting: Family Medicine

## 2022-08-12 VITALS — BP 110/60 | HR 59 | Temp 97.8°F | Ht 68.0 in | Wt 202.8 lb

## 2022-08-12 DIAGNOSIS — G20A2 Parkinson's disease without dyskinesia, with fluctuations: Secondary | ICD-10-CM | POA: Diagnosis not present

## 2022-08-12 DIAGNOSIS — F40240 Claustrophobia: Secondary | ICD-10-CM

## 2022-08-12 DIAGNOSIS — E782 Mixed hyperlipidemia: Secondary | ICD-10-CM

## 2022-08-12 DIAGNOSIS — G3184 Mild cognitive impairment, so stated: Secondary | ICD-10-CM | POA: Diagnosis not present

## 2022-08-12 DIAGNOSIS — Z0000 Encounter for general adult medical examination without abnormal findings: Secondary | ICD-10-CM | POA: Diagnosis not present

## 2022-08-12 DIAGNOSIS — I950 Idiopathic hypotension: Secondary | ICD-10-CM | POA: Diagnosis not present

## 2022-08-12 DIAGNOSIS — D649 Anemia, unspecified: Secondary | ICD-10-CM | POA: Diagnosis not present

## 2022-08-12 DIAGNOSIS — D126 Benign neoplasm of colon, unspecified: Secondary | ICD-10-CM

## 2022-08-12 LAB — CBC WITH DIFFERENTIAL/PLATELET
Basophils Absolute: 0 10*3/uL (ref 0.0–0.1)
Basophils Relative: 0.9 % (ref 0.0–3.0)
Eosinophils Absolute: 0.1 10*3/uL (ref 0.0–0.7)
Eosinophils Relative: 1.8 % (ref 0.0–5.0)
HCT: 34.4 % — ABNORMAL LOW (ref 39.0–52.0)
Hemoglobin: 11.6 g/dL — ABNORMAL LOW (ref 13.0–17.0)
Lymphocytes Relative: 14.3 % (ref 12.0–46.0)
Lymphs Abs: 0.6 10*3/uL — ABNORMAL LOW (ref 0.7–4.0)
MCHC: 33.7 g/dL (ref 30.0–36.0)
MCV: 92.8 fl (ref 78.0–100.0)
Monocytes Absolute: 0.4 10*3/uL (ref 0.1–1.0)
Monocytes Relative: 8.8 % (ref 3.0–12.0)
Neutro Abs: 3.1 10*3/uL (ref 1.4–7.7)
Neutrophils Relative %: 74.2 % (ref 43.0–77.0)
Platelets: 217 10*3/uL (ref 150.0–400.0)
RBC: 3.71 Mil/uL — ABNORMAL LOW (ref 4.22–5.81)
RDW: 14.2 % (ref 11.5–15.5)
WBC: 4.2 10*3/uL (ref 4.0–10.5)

## 2022-08-12 LAB — LIPID PANEL
Cholesterol: 128 mg/dL (ref 0–200)
HDL: 60.8 mg/dL (ref >39.00)
LDL Cholesterol: 58 mg/dL (ref 0–99)
NonHDL: 67.05
Total CHOL/HDL Ratio: 2
Triglycerides: 43 mg/dL (ref 0.0–149.0)
VLDL: 8.6 mg/dL (ref 0.0–40.0)

## 2022-08-12 LAB — COMPREHENSIVE METABOLIC PANEL
ALT: 9 U/L (ref 0–53)
AST: 21 U/L (ref 0–37)
Albumin: 3.9 g/dL (ref 3.5–5.2)
Alkaline Phosphatase: 89 U/L (ref 39–117)
BUN: 14 mg/dL (ref 6–23)
CO2: 29 mEq/L (ref 19–32)
Calcium: 9.1 mg/dL (ref 8.4–10.5)
Chloride: 107 mEq/L (ref 96–112)
Creatinine, Ser: 0.89 mg/dL (ref 0.40–1.50)
GFR: 79.91 mL/min (ref >60.00)
Glucose, Bld: 103 mg/dL — ABNORMAL HIGH (ref 70–99)
Potassium: 4.2 mEq/L (ref 3.5–5.1)
Sodium: 142 mEq/L (ref 135–145)
Total Bilirubin: 0.3 mg/dL (ref 0.2–1.2)
Total Protein: 5.7 g/dL — ABNORMAL LOW (ref 6.0–8.3)

## 2022-08-12 LAB — B12 AND FOLATE PANEL
Folate: 22.9 ng/mL (ref >5.9)
Vitamin B-12: 407 pg/mL (ref 211–911)

## 2022-08-12 NOTE — Progress Notes (Signed)
Subjective  Chief Complaint  Patient presents with   Annual Exam    Pt her for Annual exam and is currently fasting333    HPI: Caleb Gibson is a 82 y.o. male who presents to [facility] today for a Male Wellness Visit. He also has the concerns and/or needs as listed above in the chief complaint. These will be addressed in addition to the Health Maintenance Visit.   Wellness Visit: annual visit with health maintenance review and exam   HM: current.   Body mass index is 30.84 kg/m. Wt Readings from Last 3 Encounters:  08/12/22 202 lb 12.8 oz (92 kg)  04/29/22 202 lb 6.4 oz (91.8 kg)  04/16/22 205 lb 12.8 oz (93.4 kg)     Chronic disease management visit and/or acute problem visit: Parkinson's, cognitive decline and anxiety: reviewed meds. Mostly stable. Hard because too anxious to travel which limits his and his wife's ability to see family. No falls. Gait is good. Mood is good Diarrhea: anxiety related. Reviewed GI notes. Neg infectious disease work up. Resolved.  Stopped crestor. Unclear reasons. Prefers to not take statin.   Patient Active Problem List   Diagnosis Date Noted   Parkinsonism 09/09/2021   Mild neurocognitive disorder 12/19/2020   Tubular adenoma of colon 06/15/2017   Mixed hyperlipidemia 09/27/2014   History of traumatic head injury    Idiopathic hypotension 01/07/2016   BPH (benign prostatic hyperplasia) 10/01/2011   Erectile dysfunction 10/01/2011   Osteoarthritis, knee 08/01/2010   Claustrophobia 07/08/2009   Basal cell carcinoma of skin 06/15/2017   Simple cyst of kidney 11/12/2011   Allergic rhinitis 08/27/2011   GAD (generalized anxiety disorder) 08/06/2021   REM behavioral disorder    Health Maintenance  Topic Date Due   Medicare Annual Wellness (AWV)  10/04/2020   DTaP/Tdap/Td (3 - Td or Tdap) 09/30/2021   INFLUENZA VACCINE  09/06/2022 (Originally 01/06/2022)   COLONOSCOPY (Pts 45-1yr Insurance coverage will need to  be confirmed)  02/21/2027   Pneumonia Vaccine 82 Years old  Completed   Zoster Vaccines- Shingrix  Completed   HPV VACCINES  Aged Out   COVID-19 Vaccine  Discontinued   Immunization History  Administered Date(s) Administered   Influenza Split 03/14/2011   Influenza, High Dose Seasonal PF 02/27/2012, 02/05/2014, 03/10/2015, 04/14/2016, 03/02/2017, 02/23/2018, 01/26/2019, 01/26/2019   Influenza, Seasonal, Injecte, Preservative Fre 02/27/2013   Influenza-Unspecified 04/15/2017, 03/08/2020   Pneumococcal Conjugate-13 02/05/2014   Pneumococcal Polysaccharide-23 07/08/2009   Td 11/15/2007   Tdap 10/01/2011   Zoster Recombinat (Shingrix) 02/05/2018, 04/18/2018   Zoster, Live 06/20/2010   We updated and reviewed the patient's past history in detail and it is documented below. Allergies: Patient is allergic to viagra [sildenafil citrate]. Past Medical History  has a past medical history of Allergic rhinitis (08/27/2011), Anxiety, Basal cell carcinoma of skin, BPH (benign prostatic hyperplasia) (10/01/2011), Claustrophobia, GAD (generalized anxiety disorder) (08/06/2021), History of prostate cancer (11/22/2017), History of traumatic head injury, Idiopathic hypotension (01/07/2016), Mild neurocognitive disorder (12/19/2020), Mixed hyperlipidemia (09/27/2014), Night terrors, Osteoarthritis, knee (08/01/2010), Parkinsonism (09/09/2021), REM behavioral disorder, Simple cyst of kidney (11/12/2011), and Tubular adenoma of colon (06/15/2017). Past Surgical History Patient  has a past surgical history that includes Appendectomy; Dental surgery; Rhinoplasty; Tonsillectomy and adenoidectomy; Mohs surgery; and Knee arthroscopy with meniscal repair (Right). Social History Patient  reports that he quit smoking about 28 years ago. His smoking use included cigarettes. He has never used smokeless tobacco. He reports current alcohol use of about  2.0 - 5.0 standard drinks of alcohol per week. He reports that he does  not use drugs. Family History family history includes Alcohol abuse in his maternal aunt and mother; Cancer in his maternal grandfather and maternal grandmother; Cirrhosis in his maternal aunt; Diabetes in his father; Glaucoma in his father; Heart disease in his maternal grandmother; Lymphoma in his mother; Melanoma in his mother; Mental illness in his mother; Prostate cancer in his father. Review of Systems: Constitutional: negative for fever or malaise Ophthalmic: negative for photophobia, double vision or loss of vision Cardiovascular: negative for chest pain, dyspnea on exertion, or new LE swelling Respiratory: negative for SOB or persistent cough Gastrointestinal: negative for abdominal pain, change in bowel habits or melena Genitourinary: negative for dysuria or gross hematuria Musculoskeletal: negative for new gait disturbance or muscular weakness Integumentary: negative for new or persistent rashes Neurological: negative for TIA or stroke symptoms Psychiatric: negative for SI or delusions Allergic/Immunologic: negative for hives  Patient Care Team    Relationship Specialty Notifications Start End  [name], MD PCP - General Family Medicine  06/15/17   Ceasar Mons, MD Consulting Physician Urology  01/13/18   Manson Passey, [facility]  Specialist  01/13/18   Danella Sensing, MD Consulting Physician Dermatology  01/13/18   Ronnald Ramp  Dentistry  01/13/18   Marcial Pacas, MD Consulting Physician Neurology  11/30/18   Susette Racer, MD Consulting Physician Endocrinology  11/20/19   Edythe Clarity, [facility] Pharmacist Pharmacist  03/13/21    Comment: [phone]  Loletta Specter, NP Nurse Practitioner Psychiatry  09/09/21   Armbruster, Carlota Raspberry, MD Consulting Physician Gastroenterology  01/07/22   Tat, Eustace Quail, DO Consulting Physician Neurology  02/03/22    Objective  Vitals: BP 110/60   Pulse (!) 59   Temp 97.8 F (36.6 C)   Ht '5\' 8"'$  (1.727 m)   Wt 202 lb 12.8 oz (92 kg)   SpO2 96%   BMI  30.84 kg/m  General:  Well developed, well nourished, no acute distress  Psych:  Alert and orientedx3,flat mood and affect HEENT:  Normocephalic, atraumatic, non-icteric sclera,  supple neck without adenopathy, mass or thyromegaly Cardiovascular:  Normal S1, S2, RRR without gallop, rub or murmur Respiratory:  Good breath sounds bilaterally, CTAB with normal respiratory effort Neurologic:    Mental status is normal.  Gross motor and sensory exams are normal. Stable gait. No tremor    Assessment  1. Annual physical exam   2. Mixed hyperlipidemia   3. Mild neurocognitive disorder   4. Idiopathic hypotension   5. Normocytic anemia   6. Claustrophobia   7. Parkinson's disease without dyskinesia, with fluctuating manifestations   8. Tubular adenoma of colon      Plan  Male Wellness Visit: Age appropriate Health Maintenance and Prevention measures were discussed with patient. Included topics are cancer screening recommendations, ways to keep healthy (see AVS) including dietary and exercise recommendations, regular eye and dental care, use of seat belts, and avoidance of moderate alcohol use and tobacco use.  BMI: discussed patient's BMI and encouraged positive lifestyle modifications to help get to or maintain a target BMI. HM needs and immunizations were addressed and ordered. See below for orders. See HM and immunization section for updates. Routine labs and screening tests ordered including cmp, cbc and lipids where appropriate. Discussed recommendations regarding Vit D and calcium supplementation (see AVS)  Chronic disease f/u and/or acute problem visit: (deemed necessary to be done in addition to the wellness visit): Parkinsons/anxiety:  continue with psych and neuro. Meds are helpful. Less anxiety.  Work up anemia; has h/o b12 deficiency in past.  Check lipids. Statin skeptic and has stopped.  No problems with low bp now.  H/o tubular adenoma; no further colonoscopy recommended.   Claustrophobic and anxiety: limits travel. Routines are best.   Follow up: 6 mo for recheck.   Commons side effects, risks, benefits, and alternatives for medications and treatment plan prescribed today were discussed, and the patient expressed understanding of the given instructions. Patient is instructed to call or message via MyChart if he/she has any questions or concerns regarding our treatment plan. No barriers to understanding were identified. We discussed Red Flag symptoms and signs in detail. Patient expressed understanding regarding what to do in case of urgent or emergency type symptoms.  Medication list was reconciled, printed and provided to the patient in AVS. Patient instructions and summary information was reviewed with the patient as documented in the AVS. This note was prepared with assistance of Dragon voice recognition software. Occasional wrong-word or sound-a-like substitutions may have occurred due to the inherent limitations of voice recognition software    Orders Placed This Encounter  Procedures   CBC with Differential/Platelet   Comprehensive metabolic panel   Lipid panel   B12 and Folate Panel   Iron, TIBC and Ferritin Panel   No orders of the defined types were placed in this encounter.

## 2022-08-12 NOTE — Patient Instructions (Signed)
Please return in 6 months for recheck.   I will release your lab results to you on your MyChart account with further instructions. You may see the results before I do, but when I review them I will send you a message with my report or have my assistant call you if things need to be discussed. Please reply to my message with any questions. Thank you!   If you have any questions or concerns, please don't hesitate to send me a message via MyChart or call the office at [phone]. Thank you for visiting with us today! It's our pleasure caring for you.  

## 2022-08-12 NOTE — Assessment & Plan Note (Signed)
Failed simvastatin

## 2022-08-13 LAB — IRON,TIBC AND FERRITIN PANEL
%SAT: 18 % (calc) — ABNORMAL LOW (ref 20–48)
Ferritin: 77 ng/mL (ref 24–380)
Iron: 49 ug/dL — ABNORMAL LOW (ref 50–180)
TIBC: 276 mcg/dL (calc) (ref 250–425)

## 2022-08-17 NOTE — Progress Notes (Signed)
Please call patient: I have reviewed his/her lab results. Lab results show his diet may be low in iron and protein. Consider adding protein shakes or protein bars into the diet.  Start taking iron once a day. I also recommend taking otc vit b12 1051mg 3x/weekly to boost b12 levels.  Everything else looks ok.   (Anemia of chronic disease, borderline low iron possibly).

## 2022-08-27 DIAGNOSIS — F324 Major depressive disorder, single episode, in partial remission: Secondary | ICD-10-CM | POA: Diagnosis not present

## 2022-08-27 DIAGNOSIS — F419 Anxiety disorder, unspecified: Secondary | ICD-10-CM | POA: Diagnosis not present

## 2022-08-27 DIAGNOSIS — G3184 Mild cognitive impairment, so stated: Secondary | ICD-10-CM | POA: Diagnosis not present

## 2022-09-21 ENCOUNTER — Encounter: Payer: Self-pay | Admitting: Neurology

## 2022-09-21 ENCOUNTER — Ambulatory Visit: Payer: Medicare HMO | Admitting: Neurology

## 2022-09-21 VITALS — BP 117/56 | HR 65 | Ht 70.0 in | Wt 209.0 lb

## 2022-09-21 DIAGNOSIS — G3184 Mild cognitive impairment, so stated: Secondary | ICD-10-CM

## 2022-09-21 DIAGNOSIS — G20C Parkinsonism, unspecified: Secondary | ICD-10-CM | POA: Diagnosis not present

## 2022-09-21 MED ORDER — CARBIDOPA-LEVODOPA 25-100 MG PO TABS: 1.0000 | ORAL_TABLET | Freq: Three times a day (TID) | ORAL | 3 refills | Status: DC
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
## 2022-10-20 ENCOUNTER — Ambulatory Visit (INDEPENDENT_AMBULATORY_CARE_PROVIDER_SITE_OTHER): Payer: Medicare HMO | Admitting: Family Medicine

## 2022-10-20 ENCOUNTER — Encounter: Payer: Self-pay | Admitting: Family Medicine

## 2022-10-20 VITALS — BP 110/60 | HR 71 | Temp 97.8°F | Ht 70.0 in | Wt 211.2 lb

## 2022-10-20 DIAGNOSIS — H60542 Acute eczematoid otitis externa, left ear: Secondary | ICD-10-CM | POA: Diagnosis not present

## 2022-10-20 DIAGNOSIS — H6121 Impacted cerumen, right ear: Secondary | ICD-10-CM

## 2022-10-20 NOTE — Patient Instructions (Signed)
Please follow up as scheduled for your next visit with me: 02/12/2023   If you have any questions or concerns, please don't hesitate to send me a message via MyChart or call the office at [phone]. Thank you for visiting with Caleb Gibson today! It's our pleasure caring for you.

## 2022-10-20 NOTE — Progress Notes (Signed)
Subjective  CC:  Chief Complaint  Patient presents with   Ear Pain    Pt stated that he has been having ear pain for the past 3 weeks     HPI: Caleb Gibson is a 82 y.o. male who presents to the office today to address the problems listed above in the chief complaint. Patient reports that he has a small scabbed area on the left outer ear that at times he picks at it can be tender.  No inner ear pain.  Right ear was little sore but not today.  Patient's wife said his hearing is worse and wonders if he has wax in the ear.  No URI symptoms.  No pressure in the ears.  No congestion.  Assessment  1. Eczema of left external ear   2. Hearing loss of right ear due to cerumen impaction      Plan  Eczema: Education.  Small eczematous patch, over-the-counter hydrocortisone twice daily for a week.   Cerumen impaction resolved with irrigation. Follow up: As scheduled 02/12/2023  No orders of the defined types were placed in this encounter.  No orders of the defined types were placed in this encounter.     I reviewed the patients updated PMH, FH, and SocHx.    Patient Active Problem List   Diagnosis Date Noted   Parkinsonism 09/09/2021    Priority: High   Mild neurocognitive disorder 12/19/2020    Priority: High   Tubular adenoma of colon 06/15/2017    Priority: High   Mixed hyperlipidemia 09/27/2014    Priority: High   History of traumatic head injury     Priority: Medium    Idiopathic hypotension 01/07/2016    Priority: Medium    BPH (benign prostatic hyperplasia) 10/01/2011    Priority: Medium    Erectile dysfunction 10/01/2011    Priority: Medium    Osteoarthritis, knee 08/01/2010    Priority: Medium    Claustrophobia 07/08/2009    Priority: Medium    Basal cell carcinoma of skin 06/15/2017    Priority: Low   Simple cyst of kidney 11/12/2011    Priority: Low   Allergic rhinitis 08/27/2011    Priority: Low   GAD (generalized anxiety disorder) 08/06/2021   REM  behavioral disorder    Current Meds  Medication Sig   ALPRAZolam (XANAX) 0.5 MG tablet Take 1 tablet (0.5 mg total) by mouth daily as needed for anxiety.   carbidopa-levodopa (SINEMET IR) 25-100 MG tablet Take 1 tablet by mouth 3 (three) times daily. 7am/11am/4pm   cholecalciferol (VITAMIN D) 1000 units tablet Take 1,000 Units by mouth daily.    donepezil (ARICEPT) 10 MG tablet Take 1 tablet (10 mg total) by mouth daily.   Fluticasone Propionate (FLONASE NA) Place into the nose.   memantine (NAMENDA) 10 MG tablet Take 1 tablet (10 mg total) by mouth 2 (two) times daily.   Multiple Vitamins-Minerals (CENTRUM SILVER) tablet Take 1 tablet by mouth daily.    OPTIVE 0.5-0.9 % ophthalmic solution Place 1 drop into the left eye 2 (two) times daily.   sertraline (ZOLOFT) 50 MG tablet Take 50 mg by mouth daily.   tamsulosin (FLOMAX) 0.4 MG CAPS capsule Take 0.4 mg by mouth.    Allergies: Patient is allergic to viagra [sildenafil citrate]. Family History: Patient family history includes Alcohol abuse in his maternal aunt and mother; Cancer in his maternal grandfather and maternal grandmother; Cirrhosis in his maternal aunt; Diabetes in his father; Glaucoma in his father;  Heart disease in his maternal grandmother; Lymphoma in his mother; Melanoma in his mother; Mental illness in his mother; Prostate cancer in his father. Social History:  Patient  reports that he quit smoking about 28 years ago. His smoking use included cigarettes. He has never used smokeless tobacco. He reports current alcohol use of about 2.0 - 5.0 standard drinks of alcohol per week. He reports that he does not use drugs.  Review of Systems: Constitutional: Negative for fever malaise or anorexia Cardiovascular: negative for chest pain Respiratory: negative for SOB or persistent cough Gastrointestinal: negative for abdominal pain  Objective  Vitals: BP 110/60   Pulse 71   Temp 97.8 F (36.6 C)   Ht 5\' 10"  (1.778 m)   Wt 211  lb 3.2 oz (95.8 kg)   SpO2 97%   BMI 30.30 kg/m  General: no acute distress , A&Ox3 HEENT: PEERL, conjunctiva normal, neck is supple, bilateral TMs are normal, right cerumen impaction removed with irrigation.  Tolerated well.  External auditory canals are normal.  Left lower ear pinna with small eczematous patch.  No redness warmth or drainage   Commons side effects, risks, benefits, and alternatives for medications and treatment plan prescribed today were discussed, and the patient expressed understanding of the given instructions. Patient is instructed to call or message via MyChart if he/she has any questions or concerns regarding our treatment plan. No barriers to understanding were identified. We discussed Red Flag symptoms and signs in detail. Patient expressed understanding regarding what to do in case of urgent or emergency type symptoms.  Medication list was reconciled, printed and provided to the patient in AVS. Patient instructions and summary information was reviewed with the patient as documented in the AVS. This note was prepared with assistance of Dragon voice recognition software. Occasional wrong-word or sound-a-like substitutions may have occurred due to the inherent limitations of voice recognition software

## 2022-12-08 DIAGNOSIS — D485 Neoplasm of uncertain behavior of skin: Secondary | ICD-10-CM | POA: Diagnosis not present

## 2022-12-08 DIAGNOSIS — C44622 Squamous cell carcinoma of skin of right upper limb, including shoulder: Secondary | ICD-10-CM | POA: Diagnosis not present

## 2022-12-08 DIAGNOSIS — D225 Melanocytic nevi of trunk: Secondary | ICD-10-CM | POA: Diagnosis not present

## 2022-12-08 DIAGNOSIS — Z85828 Personal history of other malignant neoplasm of skin: Secondary | ICD-10-CM | POA: Diagnosis not present

## 2022-12-08 DIAGNOSIS — D2261 Melanocytic nevi of right upper limb, including shoulder: Secondary | ICD-10-CM | POA: Diagnosis not present

## 2022-12-08 DIAGNOSIS — C44319 Basal cell carcinoma of skin of other parts of face: Secondary | ICD-10-CM | POA: Diagnosis not present

## 2022-12-08 DIAGNOSIS — L821 Other seborrheic keratosis: Secondary | ICD-10-CM | POA: Diagnosis not present

## 2022-12-08 DIAGNOSIS — L570 Actinic keratosis: Secondary | ICD-10-CM | POA: Diagnosis not present

## 2022-12-08 DIAGNOSIS — L820 Inflamed seborrheic keratosis: Secondary | ICD-10-CM | POA: Diagnosis not present

## 2022-12-08 DIAGNOSIS — D692 Other nonthrombocytopenic purpura: Secondary | ICD-10-CM | POA: Diagnosis not present

## 2022-12-08 DIAGNOSIS — C44719 Basal cell carcinoma of skin of left lower limb, including hip: Secondary | ICD-10-CM | POA: Diagnosis not present

## 2022-12-21 ENCOUNTER — Encounter: Payer: Self-pay | Admitting: Psychology

## 2022-12-22 ENCOUNTER — Ambulatory Visit: Payer: Medicare HMO | Admitting: Psychology

## 2022-12-22 DIAGNOSIS — G20A1 Parkinson's disease without dyskinesia, without mention of fluctuations: Secondary | ICD-10-CM | POA: Diagnosis not present

## 2022-12-22 DIAGNOSIS — G3184 Mild cognitive impairment, so stated: Secondary | ICD-10-CM

## 2022-12-22 DIAGNOSIS — R4189 Other symptoms and signs involving cognitive functions and awareness: Secondary | ICD-10-CM

## 2022-12-22 NOTE — Progress Notes (Signed)
   Psychometrician Note   Cognitive testing was administered to Caleb Gibson by Wallace Keller, B.S. (psychometrist) under the supervision of Dr. Newman Nickels, Ph.D., licensed psychologist on 12/22/2022. Caleb Gibson did not appear overtly distressed by the testing session per behavioral observation or responses across self-report questionnaires. Rest breaks were offered.    The battery of tests administered was selected by Dr. Newman Nickels, Ph.D. with consideration to Caleb Gibson's current level of functioning, the nature of his symptoms, emotional and behavioral responses during interview, level of literacy, observed level of motivation/effort, and the nature of the referral question. This battery was communicated to the psychometrist. Communication between Dr. Newman Nickels, Ph.D. and the psychometrist was ongoing throughout the evaluation and Dr. Newman Nickels, Ph.D. was immediately accessible at all times. Dr. Newman Nickels, Ph.D. provided supervision to the psychometrist on the date of this service to the extent necessary to assure the quality of all services provided.    Caleb Gibson will return within approximately 1-2 weeks for an interactive feedback session with Dr. Milbert Coulter at which time his test performances, clinical impressions, and treatment recommendations will be reviewed in detail. Caleb Gibson understands he can contact our office should he require our assistance before this time.  A total of 140 minutes of billable time were spent face-to-face with Caleb Gibson by the psychometrist. This includes both test administration and scoring time. Billing for these services is reflected in the clinical report generated by Dr. Newman Nickels, Ph.D.  This note reflects time spent with the psychometrician and does not include test scores or any clinical interpretations made by Dr. Milbert Coulter. The full report will follow in a separate note.

## 2022-12-22 NOTE — Progress Notes (Unsigned)
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 relative to his July 2023 evaluation. He reported some mild hearing loss but not to the extent where hearing aids are required. 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 This was unchanged.  Balance/coordination difficulties: Largely denied. He previously reported that his balance has declined and that he must be very careful when standing up and beginning to walk. However, he generally denied balance concerns during the current evaluation. His wife did report that he has exhibited some gait changes and a reduced arm swing. He denied any recent falls or shuffling behaviors. Recently, his wife reported potential improvements in his overall balance, attributed to his levodopa medication and regular exercise. No recent falls were described.  Other motor difficulties: Denied. He did acknowledge that his handwriting has become smaller.   Sleep History: Estimated hours obtained each night: 7-8 hours.  Difficulties falling asleep: Denied. Difficulties staying asleep: Denied. Feels rested and refreshed upon awakening: Endorsed. His wife noted that he will often take a nap during the day. Sleep was said to be much improved since starting Seroquel.    History of snoring: Endorsed. History of waking up gasping for air:  Endorsed. Prior records reported frequent awakening while asleep to catch his breath in the past. He previously did not follow through with a referral for a sleep study.  Witnessed breath cessation while asleep: Denied.   History of vivid dreaming: Endorsed (see above). Excessive movement while asleep: Endorsed. Instances of acting out his dreams: Endorsed (see above).   Psychiatric/Behavioral Health History: Depression: He described his current mood as "pretty good." His wife acknowledged that his mood can vacillate depending on the situation or his day-to-day functioning. He previously reported that since his illness and subsequent decline in functioning, things had become more challenging. Current or remote suicidal ideation, intent, or plan was denied.  Anxiety: Caleb Gibson. Belcher previously acknowledged longstanding symptoms of generalized anxiety. As stated above, these were exacerbated and included symptoms of panic surrounding his prior illness. Medical records do suggest a diagnosis of generalized anxiety disorder. He did not report any medication intervention prior to Seroquel. Mania: Denied. Trauma History: Denied. Visual/auditory hallucinations: He described experiences where he will see movement in his peripheral vision, as well as some instances where he will see outlines of figures in furniture patterns. Concern for hallucinations is somewhat longstanding in nature based upon medical records. The fully-formed nature of these experiences remains difficult to determine.  Delusional thoughts: Denied.  Tobacco: Denied. Alcohol: He previously reported consuming 2-5 beers per week. He did acknowledge a remote history of increased alcohol consumption which caused problems (e.g., DUI). His wife stated that if he  had stayed with his ex-wife, he would likely be a "full blown alcoholic."  Recreational drugs: Denied.  Family History: Problem Relation Age of Onset   Alcohol abuse Mother    Melanoma  Mother    Lymphoma Mother    Mental illness Mother    Prostate cancer Father    Diabetes Father    Glaucoma Father    Heart disease Maternal Grandmother    Cancer Maternal Grandmother        type unknown   Cancer Maternal Grandfather        type unknown   Alcohol abuse Maternal Aunt    Cirrhosis Maternal Aunt    This information was confirmed by Caleb Gibson. Hemme.  Academic/Vocational History: Highest level of educational attainment: 14 years. He graduated from high school and completed two additional years of college. He noted strong performance in early academic settings, stating that the school he attended was not academically rigorous. After moving to [facility] and attending a new school, he reported quickly finding himself very behind on the curriculum and struggled during the remainder of high school. Chemistry was reported as a relative weakness.  History of developmental delay: Denied. History of grade repetition: Denied. Enrollment in special education courses: Denied. History of LD/ADHD: Denied.   Employment: Retired. Caleb Gibson. Tilghman spent 3.5 years in the [employer]. 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   Evaluation Results:   Behavioral Observations: Caleb Gibson. Levingston was accompanied by his wife, arrived to his appointment on time, and was appropriately dressed and groomed. He appeared alert and oriented. Observed gait and station were within normal limits. Gross motor functioning appeared intact upon informal observation and no abnormal movements (e.g., tremors) were noted. His affect was generally relaxed and positive. Spontaneous speech was fluent and word finding difficulties were not observed during the clinical interview. Thought processes were coherent, organized, and normal in content. Insight into his cognitive difficulties appeared adequate.   During testing, sustained attention was appropriate. Task engagement was adequate and he persisted when  challenged. Overall, Caleb Gibson. Gudiel was cooperative with the clinical interview and subsequent testing procedures.   Adequacy of Effort: The validity of neuropsychological testing is limited by the extent to which the individual being tested may be assumed to have exerted adequate effort during testing. Caleb Gibson. Roper expressed his intention to perform to the best of his abilities and exhibited adequate task engagement and persistence. Scores across stand-alone and embedded performance validity measures were within expectation. As such, the results of the current evaluation are believed to be a valid representation of Caleb Gibson. Bonsell current cognitive functioning.  Test Results: Caleb Gibson. Schmuck was fairly oriented at the time of the current evaluation. He was unable to state his current age and incorrectly stated the current month ("August").   Intellectual abilities based upon educational and vocational attainment were estimated to be in the average range. Premorbid abilities were estimated to be within the average range based upon a single-word reading test.   Processing speed was variable, ranging from the exceptionally low to average normative ranges. Basic attention was above average to well above average. More complex attention (e.g., working memory) was well below average to below average. Executive functioning was exceptionally low.  Assessed receptive language abilities were average. Likewise, Caleb Gibson. Lindahl did not exhibit any difficulties comprehending task instructions and answered all questions asked of him appropriately. Assessed expressive language was somewhat variable. An isolated impairment was exhibited across semantic fluency, while performances across phonemic fluency and  confrontation naming were average.     Assessed visuospatial/visuoconstructional abilities were largely appropriate, ranging from the below average to above average normative ranges. He did exhibit an isolated weakness across a basic visual  discrimination task.    Learning (i.e., encoding) of novel verbal information was well below average to below average. Spontaneous delayed recall (i.e., retrieval) of previously learned information was exceptionally low across a list learning task but average across story and figure-based tasks. Retention rates were 140% across a story learning task, 0% across a list learning task, and 67% across a figure drawing task. Performance across recognition tasks was exceptionally low across a list learning task but average to above average across story and figure-based tasks, suggesting some evidence for information consolidation.   Results of emotional screening instruments suggested that recent symptoms of generalized anxiety were in the minimal range, while symptoms of depression were within normal limits. A screening instrument assessing recent sleep quality suggested the presence of minimal sleep dysfunction.  Tables of Scores:   Note: This summary of test scores accompanies the interpretive report and should not be considered in isolation without reference to the appropriate sections in the text. Descriptors are based on appropriate normative data and may be adjusted based on clinical judgment. Terms such as "Within Normal Limits" and "Outside Normal Limits" are used when a more specific description of the test score cannot be determined. Descriptors refer to the current evaluation only.          Percentile - Normative Descriptor > 98 - Exceptionally High 91-97 - Well Above Average 75-90 - Above Average 25-74 - Average 9-24 - Below Average 2-8 - Well Below Average < 2 - Exceptionally Low         Validity: July 2022 July 2023 Current  DESCRIPTOR         DCT: --- --- --- --- Within Normal Limits  RBANS EI: --- --- --- --- Within Normal Limits  WAIS-IV RDS: --- --- --- --- Within Normal Limits         Orientation:        Raw Score Raw Score Raw Score Percentile   NAB Orientation, Form 1 28/29  28/29 26/29 --- ---         Cognitive Screening:        Raw Score Raw Score Raw Score Percentile   SLUMS: 16/30 16/30 19/30  --- ---         RBANS, Form A: Standard Score/ Scaled Score Standard Score/ Scaled Score Standard Score/ Scaled Score Percentile   Total Score 80 74 87 19 Below Average  Immediate Memory 81 61 76 5 Well Below Average    List Learning 8 3 6 9  Below Average    Story Memory 5 4 5 5  Well Below Average  Visuospatial/Constructional 81 87 121 92 Well Above Average    Figure Copy 8 6 11  63 Average    Line Orientation 12/20 16/20 20/20  >75 Above Average  Language 92 86 89 23 Below Average    Picture Naming 10/10 10/10 9/10 26-50 Average    Semantic Fluency 6 4 5 5  Well Below Average  Attention 100 91 94 34 Average    Digit Span 14 14 15  95 Well Above Average    Coding 6 3 3 1  Exceptionally Low  Delayed Memory 71 71 78 7 Well Below Average    List Recall 1/10 0/10 0/10 <2 Exceptionally Low    List Recognition 14/20 15/20 14/20  <2 Exceptionally Low  Story Recall 8 5 9  37 Average    Story Recognition 9/12 10/12 11/12 68-86 Average to Above Average    Figure Recall 8 9 10  50 Average    Figure Recognition 2/8 7/8 8/8 84+ Above Average          Intellectual Functioning:        Standard Score Standard Score Standard Score Percentile   Test of Premorbid Functioning: 93 94 94 34 Average         Attention/Executive Function:       Trail Making Test (TMT): Raw Score (Scaled Score) Raw Score (Scaled Score) Raw Score (Scaled Score) Percentile     Part A 58 secs.,  0 errors (8) 64 secs.,  0 errors (7) 76 secs.,  0 errors (6) 9 Below Average    Part B 234 secs.,  1 error (6) Discontinued Discontinued --- Impaired  *Based on [employer] Older Normative Studies (MOANS)               Scaled Score Scaled Score Scaled Score Percentile   WAIS-IV Digit Span: 9 8 8 25  Average    Forward 14 12 12  75 Above Average    Backward 8 7 7 16  Below Average    Sequencing 5 5 5 5  Well  Below Average         D-KEFS Color-Word Interference Test: Raw Score (Scaled Score) Raw Score (Scaled Score) Raw Score (Scaled Score) Percentile     Color Naming 43 secs. (7) 44 secs. (7) 48 secs. (5) 5 Well Below Average    Word Reading 33 secs. (7) 33 secs. (7) 31 secs. (8) 25 Average    Inhibition 138 secs. (5) 155 secs. (2) 99 secs. (9) 37 Average      Total Errors 8 errors (6) 14 errors (2) 29 errors (1) <1 Exceptionally Low    Inhibition/Switching 126 secs. (7) 137 secs. (5) 118 secs. (8) 25 Average      Total Errors 3 errors (11) 0 errors (14) 12 errors (2) <1 Exceptionally Low         Language:       Verbal Fluency Test: Raw Score (Scaled Score) Raw Score (Scaled Score) Raw Score (Scaled Score) Percentile     Phonemic Fluency (CFL) 28 (9) 25 (8) 24 (8) 25 Average    Category Fluency 23 (5) 20 (4) 17 (4) 2 Well Below Average  *Based on [employer] Older Normative Studies (MOANS)              NAB Language Module, Form 1: T Score T Score T Score Percentile     Auditory Comprehension 58 58 44 27 Average    Naming 30/31 (62) 30/31 (62) 29/31 (52) 58 Average         Visuospatial/Visuoconstruction:        Raw Score Raw Score Raw Score Percentile   Clock Drawing: 8/10 3/10 9/10 --- Within Normal Limits         NAB Spatial Module, Form 1: T Score T Score T Score Percentile     Visual Discrimination --- 36 36 8 Well Below Average          Scaled Score Scaled Score Scaled Score Percentile   WAIS-IV Block Design: 10 8 11  63 Average  WAIS-IV Visual Puzzles: --- 7 6 9  Below Average         Mood and Personality:        Raw Score Raw Score Raw Score Percentile   Geriatric Depression  Scale: 13 8 8  --- Within Normal Limits  Geriatric Anxiety Scale: 24 6 2  --- Minimal    Somatic 6 1 0 --- Minimal    Cognitive 10 2 1  --- Minimal    Affective 8 3 1  --- Minimal         Additional Questionnaires:        Raw Score Raw Score Raw Score Percentile   PROMIS Sleep Disturbance Questionnaire: 9 15  13  --- None to Slight   Informed Consent and Coding/Compliance:   The current evaluation represents a clinical evaluation for the purposes previously outlined by the referral source and is in no way reflective of a forensic evaluation.   Caleb Gibson. Murguia was provided with a verbal description of the nature and purpose of the present neuropsychological evaluation. Also reviewed were the foreseeable risks and/or discomforts and benefits of the procedure, limits of confidentiality, and mandatory reporting requirements of this provider. The patient was given the opportunity to ask questions and receive answers about the evaluation. Oral consent to participate was provided by the patient.   This evaluation was conducted by Newman Nickels, Ph.D., ABPP-CN, board certified clinical neuropsychologist. Caleb Gibson. Kulpa completed a clinical interview with Dr. Milbert Coulter, billed as one unit [phone], and 140 minutes of cognitive testing and scoring, billed as one unit [phone] and four additional units [id number]. Psychometrist Wallace Keller, B.S. assisted Dr. Milbert Coulter with test administration and scoring procedures. As a separate and discrete service, one unit [id number] and two units [phone] were billed for Dr. Tammy Sours time spent in interpretation and report writing.

## 2022-12-23 ENCOUNTER — Encounter: Payer: Self-pay | Admitting: Psychology

## 2022-12-28 DIAGNOSIS — G3184 Mild cognitive impairment, so stated: Secondary | ICD-10-CM | POA: Diagnosis not present

## 2022-12-28 DIAGNOSIS — F3341 Major depressive disorder, recurrent, in partial remission: Secondary | ICD-10-CM | POA: Diagnosis not present

## 2022-12-28 DIAGNOSIS — F411 Generalized anxiety disorder: Secondary | ICD-10-CM | POA: Diagnosis not present

## 2022-12-29 ENCOUNTER — Ambulatory Visit: Payer: Medicare HMO | Admitting: Psychology

## 2022-12-29 DIAGNOSIS — G3184 Mild cognitive impairment, so stated: Secondary | ICD-10-CM | POA: Diagnosis not present

## 2022-12-29 DIAGNOSIS — G20A1 Parkinson's disease without dyskinesia, without mention of fluctuations: Secondary | ICD-10-CM

## 2022-12-29 NOTE — Progress Notes (Signed)
   Neuropsychology Feedback Session Eligha Bridegroom. [facility] Department of Neurology  Reason for Referral:   Caleb Gibson is a 82 y.o. right-handed Caucasian male referred by  Levert Feinstein, M.D. 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   Feedback:   Mr. Straus completed a comprehensive neuropsychological evaluation on 12/22/2022. Please refer to that encounter for the full report and recommendations. Briefly, results suggested a primary impairment surrounding executive functioning. Additional weaknesses were exhibited across complex attention, semantic fluency, encoding (i.e., learning) aspects of verbal memory, and delayed retrieval of previously learned list-based information. Performance variability was exhibited across processing speed. Relative to previous evaluations in July 2022 and July 2023, there is objective evidence to suggest modest executive functioning decline over the years, as well as more subtle declines surrounding processing speed and semantic fluency. Mild improvements were exhibited across visuospatial abilities. When speaking more broadly, Mr. Blankley exhibited a general sense of cognitive stability, especially with regard to his initial 2022 evaluation. Regarding etiology, an underlying parkinsonian condition continues to be the most likely culprit for cognitive dysfunction. This is based primarily on the results of his prior DaTscan, as well as the clinical impressions of his neurological examinations. Dr. Arbutus Leas previously theorized the akinetic/rigid subtype of Parkinson's disease. Current testing patterns are quite reasonable given this suspected etiology and I would agree that a Parkinson's disease presentation appears most likely. Previous  concerns were expressed surrounding an atypical parkinsonian presentation, namely the presence of Lewy body disease, given the presence of REM sleep behaviors and concerns for potential visual hallucinations. Fortunately, current testing patterns are favorable in this regard in that they continue to not strongly align with this condition. In fact, Mr. Waibel saw improvement in the primary cognitive domain (visuospatial abilities) which would be expected to have severe impairments in a Lewy body presentation.  Mr. Mcentee was accompanied by his wife during the current feedback session. Content of the current session focused on the results of his neuropsychological evaluation. Mr. Stowers was given the opportunity to ask questions and his questions were answered. He was encouraged to reach out should additional questions arise. A copy of his report was provided at the conclusion of the visit.      One unit [phone] was billed for Dr. Tammy Sours time spent preparing for, conducting, and documenting the current feedback session with Mr. Soltys.

## 2023-01-18 DIAGNOSIS — Z85828 Personal history of other malignant neoplasm of skin: Secondary | ICD-10-CM | POA: Diagnosis not present

## 2023-01-18 DIAGNOSIS — C44319 Basal cell carcinoma of skin of other parts of face: Secondary | ICD-10-CM | POA: Diagnosis not present

## 2023-01-20 DIAGNOSIS — C61 Malignant neoplasm of prostate: Secondary | ICD-10-CM | POA: Diagnosis not present

## 2023-01-25 DIAGNOSIS — H353131 Nonexudative age-related macular degeneration, bilateral, early dry stage: Secondary | ICD-10-CM | POA: Diagnosis not present

## 2023-01-25 DIAGNOSIS — H40013 Open angle with borderline findings, low risk, bilateral: Secondary | ICD-10-CM | POA: Diagnosis not present

## 2023-01-25 DIAGNOSIS — H18513 Endothelial corneal dystrophy, bilateral: Secondary | ICD-10-CM | POA: Diagnosis not present

## 2023-01-25 DIAGNOSIS — H538 Other visual disturbances: Secondary | ICD-10-CM | POA: Diagnosis not present

## 2023-01-25 DIAGNOSIS — H35412 Lattice degeneration of retina, left eye: Secondary | ICD-10-CM | POA: Diagnosis not present

## 2023-01-28 DIAGNOSIS — N5201 Erectile dysfunction due to arterial insufficiency: Secondary | ICD-10-CM | POA: Diagnosis not present

## 2023-01-28 DIAGNOSIS — N400 Enlarged prostate without lower urinary tract symptoms: Secondary | ICD-10-CM | POA: Diagnosis not present

## 2023-01-28 DIAGNOSIS — C61 Malignant neoplasm of prostate: Secondary | ICD-10-CM | POA: Diagnosis not present

## 2023-02-10 ENCOUNTER — Ambulatory Visit (INDEPENDENT_AMBULATORY_CARE_PROVIDER_SITE_OTHER): Payer: Medicare HMO | Admitting: Family Medicine

## 2023-02-10 VITALS — BP 116/60 | HR 70 | Temp 97.6°F | Ht 70.0 in | Wt 208.8 lb

## 2023-02-10 DIAGNOSIS — E611 Iron deficiency: Secondary | ICD-10-CM

## 2023-02-10 DIAGNOSIS — G20A1 Parkinson's disease without dyskinesia, without mention of fluctuations: Secondary | ICD-10-CM | POA: Diagnosis not present

## 2023-02-10 DIAGNOSIS — G729 Myopathy, unspecified: Secondary | ICD-10-CM | POA: Diagnosis not present

## 2023-02-10 DIAGNOSIS — I950 Idiopathic hypotension: Secondary | ICD-10-CM | POA: Diagnosis not present

## 2023-02-10 DIAGNOSIS — D649 Anemia, unspecified: Secondary | ICD-10-CM | POA: Diagnosis not present

## 2023-02-10 LAB — CBC WITH DIFFERENTIAL/PLATELET
Basophils Absolute: 0 10*3/uL (ref 0.0–0.1)
Basophils Relative: 0.6 % (ref 0.0–3.0)
Eosinophils Absolute: 0.1 10*3/uL (ref 0.0–0.7)
Eosinophils Relative: 1 % (ref 0.0–5.0)
HCT: 38.1 % — ABNORMAL LOW (ref 39.0–52.0)
Hemoglobin: 12.6 g/dL — ABNORMAL LOW (ref 13.0–17.0)
Lymphocytes Relative: 13.5 % (ref 12.0–46.0)
Lymphs Abs: 0.7 10*3/uL (ref 0.7–4.0)
MCHC: 33.1 g/dL (ref 30.0–36.0)
MCV: 94.6 fl (ref 78.0–100.0)
Monocytes Absolute: 0.4 10*3/uL (ref 0.1–1.0)
Monocytes Relative: 7.7 % (ref 3.0–12.0)
Neutro Abs: 4 10*3/uL (ref 1.4–7.7)
Neutrophils Relative %: 77.2 % — ABNORMAL HIGH (ref 43.0–77.0)
Platelets: 202 10*3/uL (ref 150.0–400.0)
RBC: 4.02 Mil/uL — ABNORMAL LOW (ref 4.22–5.81)
RDW: 14.4 % (ref 11.5–15.5)
WBC: 5.2 10*3/uL (ref 4.0–10.5)

## 2023-02-10 LAB — B12 AND FOLATE PANEL
Folate: >24.2 ng/mL (ref >5.9)
Vitamin B-12: 507 pg/mL (ref 211–911)

## 2023-02-10 NOTE — Patient Instructions (Signed)
Please return in 6 months for your annual complete physical; please come fasting.   I will release your lab results to you on your MyChart account with further instructions. You may see the results before I do, but when I review them I will send you a message with my report or have my assistant call you if things need to be discussed. Please reply to my message with any questions. Thank you!   If you have any questions or concerns, please don't hesitate to send me a message via MyChart or call the office at [phone]. Thank you for visiting with us today! It's our pleasure caring for you.  

## 2023-02-10 NOTE — Progress Notes (Signed)
Subjective  CC:  Chief Complaint  Patient presents with   Anemia   Follow-up    HPI: Caleb Gibson is a 82 y.o. male who presents to the office today to address the problems listed above in the chief complaint. Parkinson's, cognitive decline follow-up: Reviewed neurology notes.  Taking medications which are helpful.  Reports overall doing well but will have weeks where he crashes: Fatigued and confused.  Grateful for his wife who is working hard to help take care of him.  He continues to drive.  Fortunately had a good trip up [place] to visit his wife's children and grandchildren.  He enjoyed this.  Anxiety seems controlled. Recent mild iron deficiency anemia: Not sure if he ever started the iron.  Nutrition is a possible cause.  Not eating as much.  Has low appetite.  His weight however is stable over the last year.  No melena.  No abdominal pain  Wt Readings from Last 3 Encounters:  02/10/23 208 lb 12.8 oz (94.7 kg)  10/20/22 211 lb 3.2 oz (95.8 kg)  09/21/22 209 lb (94.8 kg)    Assessment  1. Parkinson's disease without dyskinesia, unspecified whether manifestations fluctuate   2. Idiopathic hypotension   3. Normocytic anemia   4. Iron deficiency   5. Myopathy, unspecified      Plan  Parkinson's: Medications are helpful.  On cognitive meds as well.  Following along with neurology.  No falls.  Gait looks good today Blood pressure remained stable Recheck lab work for iron deficiency anemia and B12 levels Continue to recommend good nutrition with good protein sources  Follow up: Follow-up 6 months Visit date not found  Orders Placed This Encounter  Procedures   CBC with Differential/Platelet   B12 and Folate Panel   Iron, TIBC and Ferritin Panel   No orders of the defined types were placed in this encounter.     I reviewed the patients updated PMH, FH, and SocHx.    Patient Active Problem List   Diagnosis Date Noted   Parkinson's disease, akinetic/rigid subtype  02/03/2022    Priority: High   GAD (generalized anxiety disorder) 08/06/2021    Priority: High   Mild neurocognitive disorder 12/19/2020    Priority: High   Tubular adenoma of colon 06/15/2017    Priority: High   Mixed hyperlipidemia 09/27/2014    Priority: High   History of traumatic head injury     Priority: Medium    Idiopathic hypotension 01/07/2016    Priority: Medium    BPH (benign prostatic hyperplasia) 10/01/2011    Priority: Medium    Erectile dysfunction 10/01/2011    Priority: Medium    Osteoarthritis, knee 08/01/2010    Priority: Medium    Claustrophobia 07/08/2009    Priority: Medium    Basal cell carcinoma of skin 06/15/2017    Priority: Low   Simple cyst of kidney 11/12/2011    Priority: Low   Allergic rhinitis 08/27/2011    Priority: Low   REM behavioral disorder    Current Meds  Medication Sig   ALPRAZolam (XANAX) 0.5 MG tablet Take 1 tablet (0.5 mg total) by mouth daily as needed for anxiety.   carbidopa-levodopa (SINEMET IR) 25-100 MG tablet Take 1 tablet by mouth 3 (three) times daily. 7am/11am/4pm   cholecalciferol (VITAMIN D) 1000 units tablet Take 1,000 Units by mouth daily.    donepezil (ARICEPT) 10 MG tablet Take 1 tablet (10 mg total) by mouth daily.   Fluticasone Propionate (FLONASE NA)  Place into the nose.   memantine (NAMENDA) 10 MG tablet Take 1 tablet (10 mg total) by mouth 2 (two) times daily.   Multiple Vitamins-Minerals (CENTRUM SILVER) tablet Take 1 tablet by mouth daily.    OPTIVE 0.5-0.9 % ophthalmic solution Place 1 drop into the left eye 2 (two) times daily.   sertraline (ZOLOFT) 50 MG tablet Take 50 mg by mouth daily.   tamsulosin (FLOMAX) 0.4 MG CAPS capsule Take 0.4 mg by mouth.    Allergies: Patient is allergic to viagra [sildenafil citrate]. Family History: Patient family history includes Alcohol abuse in his maternal aunt and mother; Cancer in his maternal grandfather and maternal grandmother; Cirrhosis in his maternal aunt;  Diabetes in his father; Glaucoma in his father; Heart disease in his maternal grandmother; Lymphoma in his mother; Melanoma in his mother; Mental illness in his mother; Prostate cancer in his father. Social History:  Patient  reports that he quit smoking about 28 years ago. His smoking use included cigarettes. He has never used smokeless tobacco. He reports current alcohol use of about 2.0 - 5.0 standard drinks of alcohol per week. He reports that he does not use drugs.  Review of Systems: Constitutional: Negative for fever malaise or anorexia Cardiovascular: negative for chest pain Respiratory: negative for SOB or persistent cough Gastrointestinal: negative for abdominal pain  Objective  Vitals: BP 116/60   Pulse 70   Temp 97.6 F (36.4 C)   Ht 5\' 10"  (1.778 m)   Wt 208 lb 12.8 oz (94.7 kg)   SpO2 96%   BMI 29.96 kg/m  General: no acute distress , A&Ox3 HEENT: PEERL, conjunctiva normal, neck is supple Cardiovascular:  RRR without murmur or gallop.  Respiratory:  Good breath sounds bilaterally, CTAB with normal respiratory effort Skin:  Warm, no rashes  Commons side effects, risks, benefits, and alternatives for medications and treatment plan prescribed today were discussed, and the patient expressed understanding of the given instructions. Patient is instructed to call or message via MyChart if he/she has any questions or concerns regarding our treatment plan. No barriers to understanding were identified. We discussed Red Flag symptoms and signs in detail. Patient expressed understanding regarding what to do in case of urgent or emergency type symptoms.  Medication list was reconciled, printed and provided to the patient in AVS. Patient instructions and summary information was reviewed with the patient as documented in the AVS. This note was prepared with assistance of Dragon voice recognition software. Occasional wrong-word or sound-a-like substitutions may have occurred due to the  inherent limitations of voice recognition software

## 2023-02-11 LAB — IRON,TIBC AND FERRITIN PANEL
%SAT: 33 % (ref 20–48)
Ferritin: 73 ng/mL (ref 24–380)
Iron: 96 ug/dL (ref 50–180)
TIBC: 292 ug/dL (ref 250–425)

## 2023-02-12 ENCOUNTER — Ambulatory Visit: Payer: Medicare HMO | Admitting: Family Medicine

## 2023-02-15 ENCOUNTER — Other Ambulatory Visit: Payer: Self-pay | Admitting: Family Medicine

## 2023-02-15 DIAGNOSIS — H43813 Vitreous degeneration, bilateral: Secondary | ICD-10-CM | POA: Diagnosis not present

## 2023-02-15 DIAGNOSIS — H43393 Other vitreous opacities, bilateral: Secondary | ICD-10-CM | POA: Diagnosis not present

## 2023-02-15 DIAGNOSIS — H59811 Chorioretinal scars after surgery for detachment, right eye: Secondary | ICD-10-CM | POA: Diagnosis not present

## 2023-02-15 DIAGNOSIS — H353121 Nonexudative age-related macular degeneration, left eye, early dry stage: Secondary | ICD-10-CM | POA: Diagnosis not present

## 2023-02-15 DIAGNOSIS — H35361 Drusen (degenerative) of macula, right eye: Secondary | ICD-10-CM | POA: Diagnosis not present

## 2023-02-18 NOTE — Progress Notes (Signed)
Please call patient:lab results show anemia is resolving. Results look better. Nothing new is needed.

## 2023-02-19 ENCOUNTER — Telehealth: Payer: Self-pay | Admitting: Family Medicine

## 2023-02-19 ENCOUNTER — Telehealth: Payer: Self-pay

## 2023-02-19 NOTE — Telephone Encounter (Signed)
Pt would like a call back with lab results and they also want a hard copy to be mailed to them as well.

## 2023-02-19 NOTE — Telephone Encounter (Signed)
Spoke with wife and she requested lab results be sent thru mail. Results have been printed off and placed in mail.  Christy Gentles

## 2023-02-19 NOTE — Telephone Encounter (Signed)
Done

## 2023-02-26 ENCOUNTER — Telehealth: Payer: Self-pay | Admitting: Family Medicine

## 2023-02-26 NOTE — Telephone Encounter (Signed)
Patient's spouse called stating his pharmacy called to see if he needed refill of the rosuvastatin but they aren't sure due to lipid panel not recently being checked. Per spouse, patient has been taking the rosuvastatin for 4-5 years and they wanted to know if he should stop since it wasn't recently checked or refill it. Please Advise.

## 2023-03-18 DIAGNOSIS — M546 Pain in thoracic spine: Secondary | ICD-10-CM | POA: Diagnosis not present

## 2023-03-18 DIAGNOSIS — M9902 Segmental and somatic dysfunction of thoracic region: Secondary | ICD-10-CM | POA: Diagnosis not present

## 2023-03-18 DIAGNOSIS — M9901 Segmental and somatic dysfunction of cervical region: Secondary | ICD-10-CM | POA: Diagnosis not present

## 2023-03-18 DIAGNOSIS — M9903 Segmental and somatic dysfunction of lumbar region: Secondary | ICD-10-CM | POA: Diagnosis not present

## 2023-03-18 DIAGNOSIS — M9904 Segmental and somatic dysfunction of sacral region: Secondary | ICD-10-CM | POA: Diagnosis not present

## 2023-03-18 DIAGNOSIS — M5134 Other intervertebral disc degeneration, thoracic region: Secondary | ICD-10-CM | POA: Diagnosis not present

## 2023-03-22 DIAGNOSIS — G3184 Mild cognitive impairment, so stated: Secondary | ICD-10-CM | POA: Diagnosis not present

## 2023-03-22 DIAGNOSIS — F3341 Major depressive disorder, recurrent, in partial remission: Secondary | ICD-10-CM | POA: Diagnosis not present

## 2023-03-22 DIAGNOSIS — F411 Generalized anxiety disorder: Secondary | ICD-10-CM | POA: Diagnosis not present

## 2023-03-23 DIAGNOSIS — M9904 Segmental and somatic dysfunction of sacral region: Secondary | ICD-10-CM | POA: Diagnosis not present

## 2023-03-23 DIAGNOSIS — M9903 Segmental and somatic dysfunction of lumbar region: Secondary | ICD-10-CM | POA: Diagnosis not present

## 2023-03-23 DIAGNOSIS — M9901 Segmental and somatic dysfunction of cervical region: Secondary | ICD-10-CM | POA: Diagnosis not present

## 2023-03-23 DIAGNOSIS — M9902 Segmental and somatic dysfunction of thoracic region: Secondary | ICD-10-CM | POA: Diagnosis not present

## 2023-03-23 DIAGNOSIS — M546 Pain in thoracic spine: Secondary | ICD-10-CM | POA: Diagnosis not present

## 2023-03-23 DIAGNOSIS — M5134 Other intervertebral disc degeneration, thoracic region: Secondary | ICD-10-CM | POA: Diagnosis not present

## 2023-03-25 DIAGNOSIS — M9902 Segmental and somatic dysfunction of thoracic region: Secondary | ICD-10-CM | POA: Diagnosis not present

## 2023-03-25 DIAGNOSIS — M9904 Segmental and somatic dysfunction of sacral region: Secondary | ICD-10-CM | POA: Diagnosis not present

## 2023-03-25 DIAGNOSIS — M9903 Segmental and somatic dysfunction of lumbar region: Secondary | ICD-10-CM | POA: Diagnosis not present

## 2023-03-25 DIAGNOSIS — M9901 Segmental and somatic dysfunction of cervical region: Secondary | ICD-10-CM | POA: Diagnosis not present

## 2023-03-30 DIAGNOSIS — M9904 Segmental and somatic dysfunction of sacral region: Secondary | ICD-10-CM | POA: Diagnosis not present

## 2023-03-30 DIAGNOSIS — M9902 Segmental and somatic dysfunction of thoracic region: Secondary | ICD-10-CM | POA: Diagnosis not present

## 2023-03-30 DIAGNOSIS — M9903 Segmental and somatic dysfunction of lumbar region: Secondary | ICD-10-CM | POA: Diagnosis not present

## 2023-03-30 DIAGNOSIS — M9901 Segmental and somatic dysfunction of cervical region: Secondary | ICD-10-CM | POA: Diagnosis not present

## 2023-04-01 DIAGNOSIS — M9903 Segmental and somatic dysfunction of lumbar region: Secondary | ICD-10-CM | POA: Diagnosis not present

## 2023-04-01 DIAGNOSIS — M9902 Segmental and somatic dysfunction of thoracic region: Secondary | ICD-10-CM | POA: Diagnosis not present

## 2023-04-01 DIAGNOSIS — M9901 Segmental and somatic dysfunction of cervical region: Secondary | ICD-10-CM | POA: Diagnosis not present

## 2023-04-01 DIAGNOSIS — M9904 Segmental and somatic dysfunction of sacral region: Secondary | ICD-10-CM | POA: Diagnosis not present

## 2023-04-06 DIAGNOSIS — M9904 Segmental and somatic dysfunction of sacral region: Secondary | ICD-10-CM | POA: Diagnosis not present

## 2023-04-06 DIAGNOSIS — M9902 Segmental and somatic dysfunction of thoracic region: Secondary | ICD-10-CM | POA: Diagnosis not present

## 2023-04-06 DIAGNOSIS — M9901 Segmental and somatic dysfunction of cervical region: Secondary | ICD-10-CM | POA: Diagnosis not present

## 2023-04-06 DIAGNOSIS — M9903 Segmental and somatic dysfunction of lumbar region: Secondary | ICD-10-CM | POA: Diagnosis not present

## 2023-04-08 DIAGNOSIS — M9904 Segmental and somatic dysfunction of sacral region: Secondary | ICD-10-CM | POA: Diagnosis not present

## 2023-04-08 DIAGNOSIS — M9903 Segmental and somatic dysfunction of lumbar region: Secondary | ICD-10-CM | POA: Diagnosis not present

## 2023-04-08 DIAGNOSIS — M9902 Segmental and somatic dysfunction of thoracic region: Secondary | ICD-10-CM | POA: Diagnosis not present

## 2023-04-08 DIAGNOSIS — M9901 Segmental and somatic dysfunction of cervical region: Secondary | ICD-10-CM | POA: Diagnosis not present

## 2023-04-09 ENCOUNTER — Other Ambulatory Visit: Payer: Self-pay | Admitting: Neurology

## 2023-04-13 DIAGNOSIS — M9902 Segmental and somatic dysfunction of thoracic region: Secondary | ICD-10-CM | POA: Diagnosis not present

## 2023-04-13 DIAGNOSIS — M9901 Segmental and somatic dysfunction of cervical region: Secondary | ICD-10-CM | POA: Diagnosis not present

## 2023-04-13 DIAGNOSIS — M9903 Segmental and somatic dysfunction of lumbar region: Secondary | ICD-10-CM | POA: Diagnosis not present

## 2023-04-13 DIAGNOSIS — M9904 Segmental and somatic dysfunction of sacral region: Secondary | ICD-10-CM | POA: Diagnosis not present
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
## 2023-04-14 ENCOUNTER — Ambulatory Visit: Payer: Medicare HMO | Admitting: Neurology

## 2023-04-14 ENCOUNTER — Encounter: Payer: Self-pay | Admitting: Neurology

## 2023-04-14 VITALS — BP 110/68 | Resp 15 | Ht 70.0 in | Wt 215.2 lb

## 2023-04-14 DIAGNOSIS — G20A1 Parkinson's disease without dyskinesia, without mention of fluctuations: Secondary | ICD-10-CM | POA: Diagnosis not present

## 2023-04-14 DIAGNOSIS — G3184 Mild cognitive impairment, so stated: Secondary | ICD-10-CM | POA: Diagnosis not present

## 2023-04-14 MED ORDER — CARBIDOPA-LEVODOPA 25-100 MG PO TABS: 1.0000 | ORAL_TABLET | Freq: Three times a day (TID) | ORAL | 3 refills | Status: DC

## 2023-04-14 MED ORDER — MEMANTINE HCL 10 MG PO TABS: 10.0000 mg | ORAL_TABLET | Freq: Two times a day (BID) | ORAL | 3 refills | Status: DC

## 2023-04-14 MED ORDER — DONEPEZIL HCL 10 MG PO TABS: 10.0000 mg | ORAL_TABLET | Freq: Every day | ORAL | 3 refills | Status: DC

## 2023-04-14 NOTE — Patient Instructions (Signed)
Great to see you today.  We will continue current medication.  Continue exercise, physical activity.  Recommend brain stimulating activity, good management of vascular risk factors.  I will see you back in 6 months.  Thanks!!

## 2023-04-15 DIAGNOSIS — M9903 Segmental and somatic dysfunction of lumbar region: Secondary | ICD-10-CM | POA: Diagnosis not present

## 2023-04-15 DIAGNOSIS — M9904 Segmental and somatic dysfunction of sacral region: Secondary | ICD-10-CM | POA: Diagnosis not present

## 2023-04-15 DIAGNOSIS — M9901 Segmental and somatic dysfunction of cervical region: Secondary | ICD-10-CM | POA: Diagnosis not present

## 2023-04-15 DIAGNOSIS — M9902 Segmental and somatic dysfunction of thoracic region: Secondary | ICD-10-CM | POA: Diagnosis not present

## 2023-04-20 DIAGNOSIS — M9902 Segmental and somatic dysfunction of thoracic region: Secondary | ICD-10-CM | POA: Diagnosis not present

## 2023-04-20 DIAGNOSIS — M9904 Segmental and somatic dysfunction of sacral region: Secondary | ICD-10-CM | POA: Diagnosis not present

## 2023-04-20 DIAGNOSIS — M9901 Segmental and somatic dysfunction of cervical region: Secondary | ICD-10-CM | POA: Diagnosis not present

## 2023-04-20 DIAGNOSIS — M9903 Segmental and somatic dysfunction of lumbar region: Secondary | ICD-10-CM | POA: Diagnosis not present

## 2023-04-22 DIAGNOSIS — M9904 Segmental and somatic dysfunction of sacral region: Secondary | ICD-10-CM | POA: Diagnosis not present

## 2023-04-22 DIAGNOSIS — M9902 Segmental and somatic dysfunction of thoracic region: Secondary | ICD-10-CM | POA: Diagnosis not present

## 2023-04-22 DIAGNOSIS — M9901 Segmental and somatic dysfunction of cervical region: Secondary | ICD-10-CM | POA: Diagnosis not present

## 2023-04-22 DIAGNOSIS — M9903 Segmental and somatic dysfunction of lumbar region: Secondary | ICD-10-CM | POA: Diagnosis not present

## 2023-04-27 DIAGNOSIS — M9903 Segmental and somatic dysfunction of lumbar region: Secondary | ICD-10-CM | POA: Diagnosis not present

## 2023-04-27 DIAGNOSIS — M9902 Segmental and somatic dysfunction of thoracic region: Secondary | ICD-10-CM | POA: Diagnosis not present

## 2023-04-27 DIAGNOSIS — M9904 Segmental and somatic dysfunction of sacral region: Secondary | ICD-10-CM | POA: Diagnosis not present

## 2023-04-27 DIAGNOSIS — M9901 Segmental and somatic dysfunction of cervical region: Secondary | ICD-10-CM | POA: Diagnosis not present

## 2023-04-29 DIAGNOSIS — M9903 Segmental and somatic dysfunction of lumbar region: Secondary | ICD-10-CM | POA: Diagnosis not present

## 2023-04-29 DIAGNOSIS — M9902 Segmental and somatic dysfunction of thoracic region: Secondary | ICD-10-CM | POA: Diagnosis not present

## 2023-04-29 DIAGNOSIS — M9904 Segmental and somatic dysfunction of sacral region: Secondary | ICD-10-CM | POA: Diagnosis not present

## 2023-04-29 DIAGNOSIS — M9901 Segmental and somatic dysfunction of cervical region: Secondary | ICD-10-CM | POA: Diagnosis not present

## 2023-05-03 DIAGNOSIS — M9903 Segmental and somatic dysfunction of lumbar region: Secondary | ICD-10-CM | POA: Diagnosis not present

## 2023-05-03 DIAGNOSIS — M9902 Segmental and somatic dysfunction of thoracic region: Secondary | ICD-10-CM | POA: Diagnosis not present

## 2023-05-03 DIAGNOSIS — M9901 Segmental and somatic dysfunction of cervical region: Secondary | ICD-10-CM | POA: Diagnosis not present

## 2023-05-03 DIAGNOSIS — M9904 Segmental and somatic dysfunction of sacral region: Secondary | ICD-10-CM | POA: Diagnosis not present

## 2023-05-04 DIAGNOSIS — M9902 Segmental and somatic dysfunction of thoracic region: Secondary | ICD-10-CM | POA: Diagnosis not present

## 2023-05-04 DIAGNOSIS — M9904 Segmental and somatic dysfunction of sacral region: Secondary | ICD-10-CM | POA: Diagnosis not present

## 2023-05-04 DIAGNOSIS — M9903 Segmental and somatic dysfunction of lumbar region: Secondary | ICD-10-CM | POA: Diagnosis not present

## 2023-05-04 DIAGNOSIS — M9901 Segmental and somatic dysfunction of cervical region: Secondary | ICD-10-CM | POA: Diagnosis not present

## 2023-05-11 DIAGNOSIS — M9903 Segmental and somatic dysfunction of lumbar region: Secondary | ICD-10-CM | POA: Diagnosis not present

## 2023-05-11 DIAGNOSIS — M9902 Segmental and somatic dysfunction of thoracic region: Secondary | ICD-10-CM | POA: Diagnosis not present

## 2023-05-11 DIAGNOSIS — M9904 Segmental and somatic dysfunction of sacral region: Secondary | ICD-10-CM | POA: Diagnosis not present

## 2023-05-11 DIAGNOSIS — M9901 Segmental and somatic dysfunction of cervical region: Secondary | ICD-10-CM | POA: Diagnosis not present

## 2023-05-13 DIAGNOSIS — M9904 Segmental and somatic dysfunction of sacral region: Secondary | ICD-10-CM | POA: Diagnosis not present

## 2023-05-13 DIAGNOSIS — M9901 Segmental and somatic dysfunction of cervical region: Secondary | ICD-10-CM | POA: Diagnosis not present

## 2023-05-13 DIAGNOSIS — M9902 Segmental and somatic dysfunction of thoracic region: Secondary | ICD-10-CM | POA: Diagnosis not present

## 2023-05-13 DIAGNOSIS — M9903 Segmental and somatic dysfunction of lumbar region: Secondary | ICD-10-CM | POA: Diagnosis not present

## 2023-05-18 DIAGNOSIS — M9903 Segmental and somatic dysfunction of lumbar region: Secondary | ICD-10-CM | POA: Diagnosis not present

## 2023-05-18 DIAGNOSIS — M9901 Segmental and somatic dysfunction of cervical region: Secondary | ICD-10-CM | POA: Diagnosis not present

## 2023-05-18 DIAGNOSIS — M9902 Segmental and somatic dysfunction of thoracic region: Secondary | ICD-10-CM | POA: Diagnosis not present

## 2023-05-18 DIAGNOSIS — M9904 Segmental and somatic dysfunction of sacral region: Secondary | ICD-10-CM | POA: Diagnosis not present

## 2023-05-20 DIAGNOSIS — M9902 Segmental and somatic dysfunction of thoracic region: Secondary | ICD-10-CM | POA: Diagnosis not present

## 2023-05-20 DIAGNOSIS — M9901 Segmental and somatic dysfunction of cervical region: Secondary | ICD-10-CM | POA: Diagnosis not present

## 2023-05-20 DIAGNOSIS — M9904 Segmental and somatic dysfunction of sacral region: Secondary | ICD-10-CM | POA: Diagnosis not present

## 2023-05-20 DIAGNOSIS — M9903 Segmental and somatic dysfunction of lumbar region: Secondary | ICD-10-CM | POA: Diagnosis not present

## 2023-05-21 ENCOUNTER — Ambulatory Visit (INDEPENDENT_AMBULATORY_CARE_PROVIDER_SITE_OTHER): Payer: Medicare HMO | Admitting: Family Medicine

## 2023-05-21 VITALS — BP 130/82 | HR 78 | Temp 97.6°F | Ht 70.0 in | Wt 218.0 lb

## 2023-05-21 DIAGNOSIS — R300 Dysuria: Secondary | ICD-10-CM | POA: Diagnosis not present

## 2023-05-21 LAB — POCT URINALYSIS DIPSTICK
Bilirubin, UA: NEGATIVE
Blood, UA: NEGATIVE
Glucose, UA: NEGATIVE
Ketones, UA: POSITIVE — ABNORMAL HIGH
Leukocytes, UA: NEGATIVE
Nitrite, UA: NEGATIVE
Protein, UA: POSITIVE — AB
Spec Grav, UA: 1.025 (ref 1.010–1.025)
Urobilinogen, UA: NEGATIVE U/dL — AB
pH, UA: 5.5 (ref 5.0–8.0)

## 2023-05-21 NOTE — Progress Notes (Signed)
Subjective  CC:  Chief Complaint  Patient presents with   Urinary Frequency    Pt stated that he has been experiencing some frequent urination and some burning that started over the weekend. Stated that it seems to be getting better.    HPI: Caleb Gibson is a 82 y.o. male who presents to the office today to address the problems listed above in the chief complaint. C/o urinary frequency and intermittent dysuria over the last 5-7 days w/o fever, chills, abd pain, penile rash or d/c or flank pain. No odor to urine or gross hematuria. Has mild BPH treated with flomax. Has once nightly nocturia w/o change.    Assessment  1. Dysuria      Plan  dysuria:  urine is concentrated w/o markers for infection on dipstick. Intermittent sxs. Rec push fluids and await culture results. Will treat if +. Patient understands and agrees with care plan.    Follow up: as scheduled 08/25/2023  Orders Placed This Encounter  Procedures   Urine Culture   POCT urinalysis dipstick   No orders of the defined types were placed in this encounter.     I reviewed the patients updated PMH, FH, and SocHx.    Patient Active Problem List   Diagnosis Date Noted   Parkinson's disease, akinetic/rigid subtype 02/03/2022    Priority: High   GAD (generalized anxiety disorder) 08/06/2021    Priority: High   Mild neurocognitive disorder 12/19/2020    Priority: High   Tubular adenoma of colon 06/15/2017    Priority: High   Mixed hyperlipidemia 09/27/2014    Priority: High   History of traumatic head injury     Priority: Medium    Idiopathic hypotension 01/07/2016    Priority: Medium    BPH (benign prostatic hyperplasia) 10/01/2011    Priority: Medium    Erectile dysfunction 10/01/2011    Priority: Medium    Osteoarthritis, knee 08/01/2010    Priority: Medium    Claustrophobia 07/08/2009    Priority: Medium    Basal cell carcinoma of skin 06/15/2017    Priority: Low   Simple cyst of kidney 11/12/2011     Priority: Low   Allergic rhinitis 08/27/2011    Priority: Low   REM behavioral disorder    Current Meds  Medication Sig   ALPRAZolam (XANAX) 0.5 MG tablet Take 1 tablet (0.5 mg total) by mouth daily as needed for anxiety.   carbidopa-levodopa (SINEMET IR) 25-100 MG tablet Take 1 tablet by mouth 3 (three) times daily. 7am/11am/4pm   cholecalciferol (VITAMIN D) 1000 units tablet Take 1,000 Units by mouth daily.    donepezil (ARICEPT) 10 MG tablet Take 1 tablet (10 mg total) by mouth daily.   Fluticasone Propionate (FLONASE NA) Place into the nose.   memantine (NAMENDA) 10 MG tablet Take 1 tablet (10 mg total) by mouth 2 (two) times daily.   Multiple Vitamins-Minerals (CENTRUM SILVER) tablet Take 1 tablet by mouth daily.    OPTIVE 0.5-0.9 % ophthalmic solution Place 1 drop into the left eye 2 (two) times daily.   sertraline (ZOLOFT) 50 MG tablet Take 50 mg by mouth daily.   tamsulosin (FLOMAX) 0.4 MG CAPS capsule Take 0.4 mg by mouth.    Allergies: Patient is allergic to viagra [sildenafil citrate]. Family History: Patient family history includes Alcohol abuse in his maternal aunt and mother; Cancer in his maternal grandfather and maternal grandmother; Cirrhosis in his maternal aunt; Diabetes in his father; Glaucoma in his father; Heart disease  in his maternal grandmother; Lymphoma in his mother; Melanoma in his mother; Mental illness in his mother; Prostate cancer in his father. Social History:  Patient  reports that he quit smoking about 28 years ago. His smoking use included cigarettes. He has never used smokeless tobacco. He reports current alcohol use of about 2.0 - 5.0 standard drinks of alcohol per week. He reports that he does not use drugs.  Review of Systems: Constitutional: Negative for fever malaise or anorexia Cardiovascular: negative for chest pain Respiratory: negative for SOB or persistent cough Gastrointestinal: negative for abdominal pain  Objective  Vitals: BP 130/82    Pulse 78   Temp 97.6 F (36.4 C)   Ht 5\' 10"  (1.778 m)   Wt 218 lb (98.9 kg)   SpO2 97%   BMI 31.28 kg/m  General: no acute distress , A&Ox3 Well appearing  Office Visit on 05/21/2023  Component Date Value Ref Range Status   Color, UA 05/21/2023 yellow   Final   Clarity, UA 05/21/2023 clear   Final   Glucose, UA 05/21/2023 Negative  Negative Final   Bilirubin, UA 05/21/2023 negative   Final   Ketones, UA 05/21/2023 positive (H)   Final   Spec Grav, UA 05/21/2023 1.025  1.010 - 1.025 Final   Blood, UA 05/21/2023 negative   Final   pH, UA 05/21/2023 5.5  5.0 - 8.0 Final   Protein, UA 05/21/2023 Positive (A)  Negative Final   Urobilinogen, UA 05/21/2023 negative (A)  0.2 or 1.0 E.U./dL Final   Nitrite, UA 16/03/9603 negative   Final   Leukocytes, UA 05/21/2023 Negative  Negative Final     Commons side effects, risks, benefits, and alternatives for medications and treatment plan prescribed today were discussed, and the patient expressed understanding of the given instructions. Patient is instructed to call or message via MyChart if he/she has any questions or concerns regarding our treatment plan. No barriers to understanding were identified. We discussed Red Flag symptoms and signs in detail. Patient expressed understanding regarding what to do in case of urgent or emergency type symptoms.  Medication list was reconciled, printed and provided to the patient in AVS. Patient instructions and summary information was reviewed with the patient as documented in the AVS. This note was prepared with assistance of Dragon voice recognition software. Occasional wrong-word or sound-a-like substitutions may have occurred due to the inherent limitations of voice recognition software

## 2023-05-23 LAB — URINE CULTURE
MICRO NUMBER:: 15848498
Result:: NO GROWTH
SPECIMEN QUALITY:: ADEQUATE

## 2023-05-24 NOTE — Progress Notes (Signed)
Please call patient:urine culture is negative for infection.

## 2023-05-25 DIAGNOSIS — M9901 Segmental and somatic dysfunction of cervical region: Secondary | ICD-10-CM | POA: Diagnosis not present

## 2023-05-25 DIAGNOSIS — M9904 Segmental and somatic dysfunction of sacral region: Secondary | ICD-10-CM | POA: Diagnosis not present

## 2023-05-25 DIAGNOSIS — M9903 Segmental and somatic dysfunction of lumbar region: Secondary | ICD-10-CM | POA: Diagnosis not present

## 2023-05-25 DIAGNOSIS — M9902 Segmental and somatic dysfunction of thoracic region: Secondary | ICD-10-CM | POA: Diagnosis not present

## 2023-05-27 DIAGNOSIS — M9901 Segmental and somatic dysfunction of cervical region: Secondary | ICD-10-CM | POA: Diagnosis not present

## 2023-05-27 DIAGNOSIS — M9903 Segmental and somatic dysfunction of lumbar region: Secondary | ICD-10-CM | POA: Diagnosis not present

## 2023-05-27 DIAGNOSIS — M9904 Segmental and somatic dysfunction of sacral region: Secondary | ICD-10-CM | POA: Diagnosis not present

## 2023-05-27 DIAGNOSIS — M9902 Segmental and somatic dysfunction of thoracic region: Secondary | ICD-10-CM | POA: Diagnosis not present

## 2023-05-31 DIAGNOSIS — M9901 Segmental and somatic dysfunction of cervical region: Secondary | ICD-10-CM | POA: Diagnosis not present

## 2023-05-31 DIAGNOSIS — M9904 Segmental and somatic dysfunction of sacral region: Secondary | ICD-10-CM | POA: Diagnosis not present

## 2023-05-31 DIAGNOSIS — M9903 Segmental and somatic dysfunction of lumbar region: Secondary | ICD-10-CM | POA: Diagnosis not present

## 2023-05-31 DIAGNOSIS — M9902 Segmental and somatic dysfunction of thoracic region: Secondary | ICD-10-CM | POA: Diagnosis not present

## 2023-06-01 DIAGNOSIS — M9904 Segmental and somatic dysfunction of sacral region: Secondary | ICD-10-CM | POA: Diagnosis not present

## 2023-06-01 DIAGNOSIS — M9903 Segmental and somatic dysfunction of lumbar region: Secondary | ICD-10-CM | POA: Diagnosis not present

## 2023-06-01 DIAGNOSIS — M9901 Segmental and somatic dysfunction of cervical region: Secondary | ICD-10-CM | POA: Diagnosis not present

## 2023-06-01 DIAGNOSIS — M9902 Segmental and somatic dysfunction of thoracic region: Secondary | ICD-10-CM | POA: Diagnosis not present

## 2023-06-03 DIAGNOSIS — F411 Generalized anxiety disorder: Secondary | ICD-10-CM | POA: Diagnosis not present

## 2023-06-03 DIAGNOSIS — F3341 Major depressive disorder, recurrent, in partial remission: Secondary | ICD-10-CM | POA: Diagnosis not present

## 2023-06-08 DIAGNOSIS — M9901 Segmental and somatic dysfunction of cervical region: Secondary | ICD-10-CM | POA: Diagnosis not present

## 2023-06-08 DIAGNOSIS — M9903 Segmental and somatic dysfunction of lumbar region: Secondary | ICD-10-CM | POA: Diagnosis not present

## 2023-06-08 DIAGNOSIS — M9902 Segmental and somatic dysfunction of thoracic region: Secondary | ICD-10-CM | POA: Diagnosis not present

## 2023-06-08 DIAGNOSIS — M9904 Segmental and somatic dysfunction of sacral region: Secondary | ICD-10-CM | POA: Diagnosis not present

## 2023-06-10 DIAGNOSIS — M9903 Segmental and somatic dysfunction of lumbar region: Secondary | ICD-10-CM | POA: Diagnosis not present

## 2023-06-10 DIAGNOSIS — M9904 Segmental and somatic dysfunction of sacral region: Secondary | ICD-10-CM | POA: Diagnosis not present

## 2023-06-10 DIAGNOSIS — M9901 Segmental and somatic dysfunction of cervical region: Secondary | ICD-10-CM | POA: Diagnosis not present

## 2023-06-10 DIAGNOSIS — M9902 Segmental and somatic dysfunction of thoracic region: Secondary | ICD-10-CM | POA: Diagnosis not present

## 2023-06-15 DIAGNOSIS — M9903 Segmental and somatic dysfunction of lumbar region: Secondary | ICD-10-CM | POA: Diagnosis not present

## 2023-06-15 DIAGNOSIS — M9904 Segmental and somatic dysfunction of sacral region: Secondary | ICD-10-CM | POA: Diagnosis not present

## 2023-06-15 DIAGNOSIS — M9902 Segmental and somatic dysfunction of thoracic region: Secondary | ICD-10-CM | POA: Diagnosis not present

## 2023-06-15 DIAGNOSIS — M9901 Segmental and somatic dysfunction of cervical region: Secondary | ICD-10-CM | POA: Diagnosis not present

## 2023-06-16 DIAGNOSIS — L905 Scar conditions and fibrosis of skin: Secondary | ICD-10-CM | POA: Diagnosis not present

## 2023-06-16 DIAGNOSIS — Z85828 Personal history of other malignant neoplasm of skin: Secondary | ICD-10-CM | POA: Diagnosis not present

## 2023-06-16 DIAGNOSIS — L570 Actinic keratosis: Secondary | ICD-10-CM | POA: Diagnosis not present

## 2023-06-16 DIAGNOSIS — D225 Melanocytic nevi of trunk: Secondary | ICD-10-CM | POA: Diagnosis not present

## 2023-06-16 DIAGNOSIS — L821 Other seborrheic keratosis: Secondary | ICD-10-CM | POA: Diagnosis not present

## 2023-06-22 DIAGNOSIS — M9904 Segmental and somatic dysfunction of sacral region: Secondary | ICD-10-CM | POA: Diagnosis not present

## 2023-06-22 DIAGNOSIS — M9902 Segmental and somatic dysfunction of thoracic region: Secondary | ICD-10-CM | POA: Diagnosis not present

## 2023-06-22 DIAGNOSIS — M9903 Segmental and somatic dysfunction of lumbar region: Secondary | ICD-10-CM | POA: Diagnosis not present

## 2023-06-22 DIAGNOSIS — M9901 Segmental and somatic dysfunction of cervical region: Secondary | ICD-10-CM | POA: Diagnosis not present

## 2023-06-29 DIAGNOSIS — M9903 Segmental and somatic dysfunction of lumbar region: Secondary | ICD-10-CM | POA: Diagnosis not present

## 2023-06-29 DIAGNOSIS — M9904 Segmental and somatic dysfunction of sacral region: Secondary | ICD-10-CM | POA: Diagnosis not present

## 2023-06-29 DIAGNOSIS — M9902 Segmental and somatic dysfunction of thoracic region: Secondary | ICD-10-CM | POA: Diagnosis not present

## 2023-06-29 DIAGNOSIS — M9901 Segmental and somatic dysfunction of cervical region: Secondary | ICD-10-CM | POA: Diagnosis not present

## 2023-06-30 DIAGNOSIS — H18513 Endothelial corneal dystrophy, bilateral: Secondary | ICD-10-CM | POA: Diagnosis not present

## 2023-06-30 DIAGNOSIS — H353131 Nonexudative age-related macular degeneration, bilateral, early dry stage: Secondary | ICD-10-CM | POA: Diagnosis not present

## 2023-06-30 DIAGNOSIS — H40013 Open angle with borderline findings, low risk, bilateral: Secondary | ICD-10-CM | POA: Diagnosis not present

## 2023-06-30 DIAGNOSIS — Z961 Presence of intraocular lens: Secondary | ICD-10-CM | POA: Diagnosis not present

## 2023-06-30 DIAGNOSIS — H1013 Acute atopic conjunctivitis, bilateral: Secondary | ICD-10-CM | POA: Diagnosis not present

## 2023-06-30 DIAGNOSIS — H35412 Lattice degeneration of retina, left eye: Secondary | ICD-10-CM | POA: Diagnosis not present

## 2023-06-30 DIAGNOSIS — H31091 Other chorioretinal scars, right eye: Secondary | ICD-10-CM | POA: Diagnosis not present

## 2023-07-06 DIAGNOSIS — M9902 Segmental and somatic dysfunction of thoracic region: Secondary | ICD-10-CM | POA: Diagnosis not present

## 2023-07-06 DIAGNOSIS — M9901 Segmental and somatic dysfunction of cervical region: Secondary | ICD-10-CM | POA: Diagnosis not present

## 2023-07-06 DIAGNOSIS — M9904 Segmental and somatic dysfunction of sacral region: Secondary | ICD-10-CM | POA: Diagnosis not present

## 2023-07-06 DIAGNOSIS — M9903 Segmental and somatic dysfunction of lumbar region: Secondary | ICD-10-CM | POA: Diagnosis not present

## 2023-07-13 DIAGNOSIS — M9904 Segmental and somatic dysfunction of sacral region: Secondary | ICD-10-CM | POA: Diagnosis not present

## 2023-07-13 DIAGNOSIS — M9903 Segmental and somatic dysfunction of lumbar region: Secondary | ICD-10-CM | POA: Diagnosis not present

## 2023-07-13 DIAGNOSIS — M9901 Segmental and somatic dysfunction of cervical region: Secondary | ICD-10-CM | POA: Diagnosis not present

## 2023-07-13 DIAGNOSIS — M9902 Segmental and somatic dysfunction of thoracic region: Secondary | ICD-10-CM | POA: Diagnosis not present

## 2023-07-16 DIAGNOSIS — C61 Malignant neoplasm of prostate: Secondary | ICD-10-CM | POA: Diagnosis not present

## 2023-07-20 DIAGNOSIS — M9904 Segmental and somatic dysfunction of sacral region: Secondary | ICD-10-CM | POA: Diagnosis not present

## 2023-07-20 DIAGNOSIS — M9901 Segmental and somatic dysfunction of cervical region: Secondary | ICD-10-CM | POA: Diagnosis not present

## 2023-07-20 DIAGNOSIS — M9903 Segmental and somatic dysfunction of lumbar region: Secondary | ICD-10-CM | POA: Diagnosis not present

## 2023-07-20 DIAGNOSIS — M9902 Segmental and somatic dysfunction of thoracic region: Secondary | ICD-10-CM | POA: Diagnosis not present

## 2023-07-22 ENCOUNTER — Encounter: Payer: Self-pay | Admitting: Psychology

## 2023-07-27 DIAGNOSIS — M9902 Segmental and somatic dysfunction of thoracic region: Secondary | ICD-10-CM | POA: Diagnosis not present

## 2023-07-27 DIAGNOSIS — M9901 Segmental and somatic dysfunction of cervical region: Secondary | ICD-10-CM | POA: Diagnosis not present

## 2023-07-27 DIAGNOSIS — M9903 Segmental and somatic dysfunction of lumbar region: Secondary | ICD-10-CM | POA: Diagnosis not present

## 2023-07-27 DIAGNOSIS — M9904 Segmental and somatic dysfunction of sacral region: Secondary | ICD-10-CM | POA: Diagnosis not present

## 2023-08-03 DIAGNOSIS — M9903 Segmental and somatic dysfunction of lumbar region: Secondary | ICD-10-CM | POA: Diagnosis not present

## 2023-08-03 DIAGNOSIS — M9904 Segmental and somatic dysfunction of sacral region: Secondary | ICD-10-CM | POA: Diagnosis not present

## 2023-08-03 DIAGNOSIS — M9902 Segmental and somatic dysfunction of thoracic region: Secondary | ICD-10-CM | POA: Diagnosis not present

## 2023-08-03 DIAGNOSIS — M9901 Segmental and somatic dysfunction of cervical region: Secondary | ICD-10-CM | POA: Diagnosis not present

## 2023-08-17 DIAGNOSIS — M9903 Segmental and somatic dysfunction of lumbar region: Secondary | ICD-10-CM | POA: Diagnosis not present

## 2023-08-17 DIAGNOSIS — M9902 Segmental and somatic dysfunction of thoracic region: Secondary | ICD-10-CM | POA: Diagnosis not present

## 2023-08-17 DIAGNOSIS — M9904 Segmental and somatic dysfunction of sacral region: Secondary | ICD-10-CM | POA: Diagnosis not present

## 2023-08-17 DIAGNOSIS — M9901 Segmental and somatic dysfunction of cervical region: Secondary | ICD-10-CM | POA: Diagnosis not present

## 2023-08-23 DIAGNOSIS — H43813 Vitreous degeneration, bilateral: Secondary | ICD-10-CM | POA: Diagnosis not present

## 2023-08-23 DIAGNOSIS — H353121 Nonexudative age-related macular degeneration, left eye, early dry stage: Secondary | ICD-10-CM | POA: Diagnosis not present

## 2023-08-23 DIAGNOSIS — H43393 Other vitreous opacities, bilateral: Secondary | ICD-10-CM | POA: Diagnosis not present

## 2023-08-23 DIAGNOSIS — Z961 Presence of intraocular lens: Secondary | ICD-10-CM | POA: Diagnosis not present

## 2023-08-23 DIAGNOSIS — H35361 Drusen (degenerative) of macula, right eye: Secondary | ICD-10-CM | POA: Diagnosis not present

## 2023-08-24 DIAGNOSIS — M9902 Segmental and somatic dysfunction of thoracic region: Secondary | ICD-10-CM | POA: Diagnosis not present

## 2023-08-24 DIAGNOSIS — M9903 Segmental and somatic dysfunction of lumbar region: Secondary | ICD-10-CM | POA: Diagnosis not present

## 2023-08-24 DIAGNOSIS — M9904 Segmental and somatic dysfunction of sacral region: Secondary | ICD-10-CM | POA: Diagnosis not present

## 2023-08-24 DIAGNOSIS — M9901 Segmental and somatic dysfunction of cervical region: Secondary | ICD-10-CM | POA: Diagnosis not present

## 2023-08-25 ENCOUNTER — Encounter: Payer: Self-pay | Admitting: Family Medicine

## 2023-08-25 ENCOUNTER — Ambulatory Visit (INDEPENDENT_AMBULATORY_CARE_PROVIDER_SITE_OTHER): Payer: Medicare HMO | Admitting: Family Medicine

## 2023-08-25 VITALS — BP 91/58 | HR 71 | Temp 97.7°F | Ht 70.0 in | Wt 223.8 lb

## 2023-08-25 DIAGNOSIS — G20A1 Parkinson's disease without dyskinesia, without mention of fluctuations: Secondary | ICD-10-CM | POA: Diagnosis not present

## 2023-08-25 DIAGNOSIS — Z0000 Encounter for general adult medical examination without abnormal findings: Secondary | ICD-10-CM

## 2023-08-25 DIAGNOSIS — F411 Generalized anxiety disorder: Secondary | ICD-10-CM | POA: Diagnosis not present

## 2023-08-25 DIAGNOSIS — Z0001 Encounter for general adult medical examination with abnormal findings: Secondary | ICD-10-CM | POA: Diagnosis not present

## 2023-08-25 DIAGNOSIS — G4752 REM sleep behavior disorder: Secondary | ICD-10-CM | POA: Diagnosis not present

## 2023-08-25 DIAGNOSIS — I950 Idiopathic hypotension: Secondary | ICD-10-CM

## 2023-08-25 DIAGNOSIS — Z13220 Encounter for screening for lipoid disorders: Secondary | ICD-10-CM | POA: Diagnosis not present

## 2023-08-25 DIAGNOSIS — G3184 Mild cognitive impairment, so stated: Secondary | ICD-10-CM | POA: Diagnosis not present

## 2023-08-25 LAB — COMPREHENSIVE METABOLIC PANEL
ALT: 6 U/L (ref 0–53)
AST: 12 U/L (ref 0–37)
Albumin: 4.2 g/dL (ref 3.5–5.2)
Alkaline Phosphatase: 78 U/L (ref 39–117)
BUN: 18 mg/dL (ref 6–23)
CO2: 27 meq/L (ref 19–32)
Calcium: 9 mg/dL (ref 8.4–10.5)
Chloride: 109 meq/L (ref 96–112)
Creatinine, Ser: 0.94 mg/dL (ref 0.40–1.50)
GFR: 75.04 mL/min (ref >60.00)
Glucose, Bld: 88 mg/dL (ref 70–99)
Potassium: 4.3 meq/L (ref 3.5–5.1)
Sodium: 143 meq/L (ref 135–145)
Total Bilirubin: 0.4 mg/dL (ref 0.2–1.2)
Total Protein: 6.1 g/dL (ref 6.0–8.3)

## 2023-08-25 LAB — CBC WITH DIFFERENTIAL/PLATELET
Basophils Absolute: 0 10*3/uL (ref 0.0–0.1)
Basophils Relative: 0.7 % (ref 0.0–3.0)
Eosinophils Absolute: 0 10*3/uL (ref 0.0–0.7)
Eosinophils Relative: 0.9 % (ref 0.0–5.0)
HCT: 38 % — ABNORMAL LOW (ref 39.0–52.0)
Hemoglobin: 12.9 g/dL — ABNORMAL LOW (ref 13.0–17.0)
Lymphocytes Relative: 12.2 % (ref 12.0–46.0)
Lymphs Abs: 0.5 10*3/uL — ABNORMAL LOW (ref 0.7–4.0)
MCHC: 33.8 g/dL (ref 30.0–36.0)
MCV: 93.4 fl (ref 78.0–100.0)
Monocytes Absolute: 0.4 10*3/uL (ref 0.1–1.0)
Monocytes Relative: 8.3 % (ref 3.0–12.0)
Neutro Abs: 3.4 10*3/uL (ref 1.4–7.7)
Neutrophils Relative %: 77.9 % — ABNORMAL HIGH (ref 43.0–77.0)
Platelets: 190 10*3/uL (ref 150.0–400.0)
RBC: 4.07 Mil/uL — ABNORMAL LOW (ref 4.22–5.81)
RDW: 13.8 % (ref 11.5–15.5)
WBC: 4.3 10*3/uL (ref 4.0–10.5)

## 2023-08-25 LAB — TSH: TSH: 1.3 u[IU]/mL (ref 0.35–5.50)

## 2023-08-25 LAB — LIPID PANEL
Cholesterol: 153 mg/dL (ref 0–200)
HDL: 58.3 mg/dL (ref >39.00)
LDL Cholesterol: 86 mg/dL (ref 0–99)
NonHDL: 94.74
Total CHOL/HDL Ratio: 3
Triglycerides: 43 mg/dL (ref 0.0–149.0)
VLDL: 8.6 mg/dL (ref 0.0–40.0)

## 2023-08-25 NOTE — Progress Notes (Signed)
 Subjective  Chief Complaint  Patient presents with   Annual Exam    Pt here for Annual Exam and is no current;y fasting     HPI: Caleb Gibson is a 83 y.o. male who presents to [facility] [place] today for a Male Wellness Visit. He also has the concerns and/or needs as listed above in the chief complaint. These will be addressed in addition to the Health Maintenance Visit.   Wellness Visit: annual visit with health maintenance review and exam   HM: imms current. No screens needed. Healthy diet. Walks for exercise.   Body mass index is 32.11 kg/m. Wt Readings from Last 3 Encounters:  08/25/23 223 lb 12.8 oz (101.5 kg)  05/21/23 218 lb (98.9 kg)  04/14/23 215 lb 3.2 oz (97.6 kg)     Chronic disease management visit and/or acute problem visit: Discussed the use of AI scribe software for clinical note transcription with the patient, who gave verbal consent to proceed.  History of Present Illness   Caleb Gibson is an 83 year old male with Parkinson's disease and mild cognitive disorder who presents for follow-up.  He has Parkinson's disease and mild cognitive disorder. He feels his Parkinson's disease is stable without significant changes. He experiences difficulty with short-term memory, often forgetting what he was talking about mid-conversation unless it involves long-term memory. He also notes sporadic memory lapses, such as forgetting planned tasks.  He has a history of anemia that improved with B12 and iron supplementation. He currently takes B12 supplements. His appetite has normalized, and his weight has increased. He recently enjoyed a large meal for [employer] Day, indicating a good appetite.     Assessment  1. Encounter for well adult exam with abnormal findings   2. Parkinson's disease without dyskinesia, unspecified whether manifestations fluctuate (HCC)   3. Mild neurocognitive disorder   4. GAD (generalized anxiety disorder)   5. Idiopathic  hypotension   6. REM behavioral disorder      Plan  Male Wellness Visit: Age appropriate Health Maintenance and Prevention measures were discussed with patient. Included topics are cancer screening recommendations, ways to keep healthy (see AVS) including dietary and exercise recommendations, regular eye and dental care, use of seat belts, and avoidance of moderate alcohol use and tobacco use.  BMI: discussed patient's BMI and encouraged positive lifestyle modifications to help get to or maintain a target BMI. HM needs and immunizations were addressed and ordered. See below for orders. See HM and immunization section for updates. Routine labs and screening tests ordered including cmp, cbc and lipids where appropriate. Discussed recommendations regarding Vit D and calcium supplementation (see AVS)  Chronic disease f/u and/or acute problem visit: (deemed necessary to be done in addition to the wellness visit): Assessment and Plan    Parkinson's disease Managing 'rigid Parkinson's' with mild cognitive disorder. Per neuro. Reports difficulty with short-term memory, typical for the condition. Cognitive testing scheduled for July.  General Health Maintenance Under dermatological care, including Mohs surgery. Good nutritional status with weight gain. - Schedule follow-up appointment in July for cognitive testing and blood pressure monitoring.     Follow up: 12 mo for cpe Commons side effects, risks, benefits, and alternatives for medications and treatment plan prescribed today were discussed, and the patient expressed understanding of the given instructions. Patient is instructed to call or message via MyChart if he/she has any questions or concerns regarding our treatment plan. No barriers to understanding were identified. We discussed Red  Flag symptoms and signs in detail. Patient expressed understanding regarding what to do in case of urgent or emergency type symptoms.  Medication list was  reconciled, printed and provided to the patient in AVS. Patient instructions and summary information was reviewed with the patient as documented in the AVS. This note was prepared with assistance of Dragon voice recognition software. Occasional wrong-word or sound-a-like substitutions may have occurred due to the inherent limitations of voice recognition software  Orders Placed This Encounter  Procedures   CBC with Differential/Platelet   Comprehensive metabolic panel   Lipid panel   TSH   No orders of the defined types were placed in this encounter.    Patient Active Problem List   Diagnosis Date Noted   Parkinson's disease, akinetic/rigid subtype 02/03/2022   GAD (generalized anxiety disorder) 08/06/2021   Mild neurocognitive disorder 12/19/2020   Tubular adenoma of colon 06/15/2017   Mixed hyperlipidemia 09/27/2014   History of traumatic head injury    Idiopathic hypotension 01/07/2016   BPH (benign prostatic hyperplasia) 10/01/2011   Erectile dysfunction 10/01/2011   Osteoarthritis, knee 08/01/2010   Claustrophobia 07/08/2009   Basal cell carcinoma of skin 06/15/2017   Simple cyst of kidney 11/12/2011   Allergic rhinitis 08/27/2011   REM behavioral disorder    Health Maintenance  Topic Date Due   Medicare Annual Wellness (AWV)  10/04/2020   INFLUENZA VACCINE  09/06/2023 (Originally 01/07/2023)   Colonoscopy  02/21/2027   Pneumonia Vaccine 37+ Years old  Completed   Zoster Vaccines- Shingrix  Completed   HPV VACCINES  Aged Out   DTaP/Tdap/Td  Discontinued   COVID-19 Vaccine  Discontinued   Immunization History  Administered Date(s) Administered   Influenza Split 03/14/2011   Influenza, High Dose Seasonal PF 02/27/2012, 02/05/2014, 03/10/2015, 04/14/2016, 03/02/2017, 02/23/2018, 01/26/2019, 01/26/2019   Influenza, Seasonal, Injecte, Preservative Fre 02/27/2013   Influenza-Unspecified 04/15/2017, 03/08/2020   Pneumococcal Conjugate-13 02/05/2014   Pneumococcal  Polysaccharide-23 07/08/2009   Td 11/15/2007   Tdap 10/01/2011   Zoster Recombinant(Shingrix) 02/05/2018, 04/18/2018   Zoster, Live 06/20/2010   We updated and reviewed the patient's past history in detail and it is documented below. Allergies: Patient is allergic to viagra [sildenafil citrate]. Past Medical History  has a past medical history of Allergic rhinitis (08/27/2011), Basal cell carcinoma of skin, BPH (benign prostatic hyperplasia) (10/01/2011), Claustrophobia, Erectile dysfunction (10/01/2011), GAD (generalized anxiety disorder) (08/06/2021), History of prostate cancer (11/22/2017), History of traumatic head injury, Idiopathic hypotension (01/07/2016), Mild neurocognitive disorder (12/19/2020), Mixed hyperlipidemia (09/27/2014), Night terrors, Osteoarthritis, knee (08/01/2010), Parkinson's disease, akinetic/rigid subtype (02/03/2022), REM behavioral disorder, Simple cyst of kidney (11/12/2011), and Tubular adenoma of colon (06/15/2017). Past Surgical History Patient  has a past surgical history that includes Appendectomy; Dental surgery; Rhinoplasty; Tonsillectomy and adenoidectomy; Mohs surgery; and Knee arthroscopy with meniscal repair (Right). Social History Patient  reports that he quit smoking about 29 years ago. His smoking use included cigarettes. He has never used smokeless tobacco. He reports current alcohol use of about 2.0 - 5.0 standard drinks of alcohol per week. He reports that he does not use drugs. Family History family history includes Alcohol abuse in his maternal aunt and mother; Cancer in his maternal grandfather and maternal grandmother; Cirrhosis in his maternal aunt; Diabetes in his father; Glaucoma in his father; Heart disease in his maternal grandmother; Lymphoma in his mother; Melanoma in his mother; Mental illness in his mother; Prostate cancer in his father. Review of Systems: Constitutional: negative for fever or malaise Ophthalmic: negative for  photophobia,  double vision or loss of vision Cardiovascular: negative for chest pain, dyspnea on exertion, or new LE swelling Respiratory: negative for SOB or persistent cough Gastrointestinal: negative for abdominal pain, change in bowel habits or melena Genitourinary: negative for dysuria or gross hematuria Musculoskeletal: negative for new gait disturbance or muscular weakness Integumentary: negative for new or persistent rashes Neurological: negative for TIA or stroke symptoms Psychiatric: negative for SI or delusions Allergic/Immunologic: negative for hives  Patient Care Team    Relationship Specialty Notifications Start End  Willow Ora, MD PCP - General Family Medicine  06/15/17   Rene Paci, MD Consulting Physician Urology  01/13/18   Gaylord Shih, [facility]  Specialist  01/13/18   Arminda Resides, MD Consulting Physician Dermatology  01/13/18   Yetta Barre  Dentistry  01/13/18   Levert Feinstein, MD Consulting Physician Neurology  11/30/18   Warden Fillers, MD Consulting Physician Endocrinology  11/20/19   Erroll Luna, [facility] (Inactive) Pharmacist Pharmacist  03/13/21    Comment: [phone]  Merian Capron, NP Nurse Practitioner Psychiatry  09/09/21   Armbruster, Willaim Rayas, MD Consulting Physician Gastroenterology  01/07/22   Tat, Octaviano Batty, DO Consulting Physician Neurology  02/03/22    Objective  Vitals: BP (!) 91/58   Pulse 71   Temp 97.7 F (36.5 C)   Ht 5\' 10"  (1.778 m)   Wt 223 lb 12.8 oz (101.5 kg)   SpO2 96%   BMI 32.11 kg/m  General:  Well developed, well nourished, no acute distress  Psych:  Alert and orientedx3,normal mood and affect HEENT:  Normocephalic, atraumatic, non-icteric sclera,  oropharynx is clear without mass or exudate, supple neck without adenopathy, or thyromegaly Cardiovascular:  Normal S1, S2, RRR without gallop, rub or murmur,  Respiratory:  Good breath sounds bilaterally, CTAB with normal respiratory effort Gastrointestinal: normal bowel sounds, soft, non-tender, no  noted masses. No HSM MSK: Joints are without erythema or swelling.  Skin:  Warm, no rashes

## 2023-08-25 NOTE — Patient Instructions (Signed)
 Please return in 12 months for your annual complete physical; please come fasting.   I will release your lab results to you on your MyChart account with further instructions. You may see the results before I do, but when I review them I will send you a message with my report or have my assistant call you if things need to be discussed. Please reply to my message with any questions. Thank you!   If you have any questions or concerns, please don't hesitate to send me a message via MyChart or call the office at [phone]. Thank you for visiting with [place] today! It's our pleasure caring for you.   VISIT SUMMARY:  Caleb Gibson, an 83 year old male with Parkinson's disease and mild cognitive disorder, came in for a follow-up visit. He reports that his Parkinson's disease is stable, but he continues to experience short-term memory issues. His anemia has improved with B12 and iron supplements, and his appetite and weight have normalized.  YOUR PLAN:  -PARKINSON'S DISEASE: Parkinson's disease is a progressive nervous system disorder that affects movement. Caleb Gibson's condition is stable, but he has difficulty with short-term memory, which is common. Cognitive testing is scheduled for July to monitor his condition further.   -GENERAL HEALTH MAINTENANCE: [name] is under dermatological care and has undergone Mohs surgery. His nutritional status is good, with recent weight gain. A follow-up appointment is scheduled for July to conduct cognitive testing and monitor blood pressure.  INSTRUCTIONS:  Please continue taking your medications as prescribed and follow up with your chiropractor as needed. Your next appointment is scheduled for July, where we will conduct cognitive testing and monitor your blood pressure.

## 2023-08-31 DIAGNOSIS — M9901 Segmental and somatic dysfunction of cervical region: Secondary | ICD-10-CM | POA: Diagnosis not present

## 2023-08-31 DIAGNOSIS — M9903 Segmental and somatic dysfunction of lumbar region: Secondary | ICD-10-CM | POA: Diagnosis not present

## 2023-08-31 DIAGNOSIS — M9904 Segmental and somatic dysfunction of sacral region: Secondary | ICD-10-CM | POA: Diagnosis not present

## 2023-08-31 DIAGNOSIS — M9902 Segmental and somatic dysfunction of thoracic region: Secondary | ICD-10-CM | POA: Diagnosis not present

## 2023-09-01 NOTE — Progress Notes (Signed)
 Please call patient:lab results are all stable. Anemia has corrected. No changes are needed. thanks

## 2023-09-02 DIAGNOSIS — F411 Generalized anxiety disorder: Secondary | ICD-10-CM | POA: Diagnosis not present

## 2023-09-02 DIAGNOSIS — F3341 Major depressive disorder, recurrent, in partial remission: Secondary | ICD-10-CM | POA: Diagnosis not present

## 2023-09-07 DIAGNOSIS — M9904 Segmental and somatic dysfunction of sacral region: Secondary | ICD-10-CM | POA: Diagnosis not present

## 2023-09-07 DIAGNOSIS — M9901 Segmental and somatic dysfunction of cervical region: Secondary | ICD-10-CM | POA: Diagnosis not present

## 2023-09-07 DIAGNOSIS — M9903 Segmental and somatic dysfunction of lumbar region: Secondary | ICD-10-CM | POA: Diagnosis not present

## 2023-09-07 DIAGNOSIS — M9902 Segmental and somatic dysfunction of thoracic region: Secondary | ICD-10-CM | POA: Diagnosis not present

## 2023-09-08 DIAGNOSIS — L570 Actinic keratosis: Secondary | ICD-10-CM | POA: Diagnosis not present

## 2023-09-08 DIAGNOSIS — Z85828 Personal history of other malignant neoplasm of skin: Secondary | ICD-10-CM | POA: Diagnosis not present

## 2023-09-14 DIAGNOSIS — M9903 Segmental and somatic dysfunction of lumbar region: Secondary | ICD-10-CM | POA: Diagnosis not present

## 2023-09-14 DIAGNOSIS — M9904 Segmental and somatic dysfunction of sacral region: Secondary | ICD-10-CM | POA: Diagnosis not present

## 2023-09-14 DIAGNOSIS — M9902 Segmental and somatic dysfunction of thoracic region: Secondary | ICD-10-CM | POA: Diagnosis not present

## 2023-09-14 DIAGNOSIS — M9901 Segmental and somatic dysfunction of cervical region: Secondary | ICD-10-CM | POA: Diagnosis not present

## 2023-09-21 DIAGNOSIS — M9902 Segmental and somatic dysfunction of thoracic region: Secondary | ICD-10-CM | POA: Diagnosis not present

## 2023-09-21 DIAGNOSIS — M9901 Segmental and somatic dysfunction of cervical region: Secondary | ICD-10-CM | POA: Diagnosis not present

## 2023-09-21 DIAGNOSIS — M9904 Segmental and somatic dysfunction of sacral region: Secondary | ICD-10-CM | POA: Diagnosis not present

## 2023-09-21 DIAGNOSIS — M9903 Segmental and somatic dysfunction of lumbar region: Secondary | ICD-10-CM | POA: Diagnosis not present

## 2023-11-10 ENCOUNTER — Encounter: Payer: Self-pay | Admitting: Family Medicine

## 2023-11-10 ENCOUNTER — Ambulatory Visit (INDEPENDENT_AMBULATORY_CARE_PROVIDER_SITE_OTHER): Admitting: Family Medicine

## 2023-11-10 VITALS — BP 88/52 | HR 75 | Temp 97.7°F | Ht 70.0 in | Wt 218.8 lb

## 2023-11-10 DIAGNOSIS — M5416 Radiculopathy, lumbar region: Secondary | ICD-10-CM | POA: Diagnosis not present

## 2023-11-10 MED ORDER — DICLOFENAC SODIUM 75 MG PO TBEC: 75.0000 mg | DELAYED_RELEASE_TABLET | Freq: Two times a day (BID) | ORAL | 0 refills | Status: DC

## 2023-11-10 NOTE — Patient Instructions (Signed)
 Please follow up if symptoms do not improve or as needed.    Back Exercises The following exercises strengthen the muscles that help to support the trunk (torso) and back. They also help to keep the lower back flexible. Doing these exercises can help to prevent or lessen existing low back pain. If you have back pain or discomfort, try doing these exercises 2-3 times each day or as told by your health care provider. As your pain improves, do them once each day, but increase the number of times that you repeat the steps for each exercise (do more repetitions). To prevent the recurrence of back pain, continue to do these exercises once each day or as told by your health care provider. Do exercises exactly as told by your health care provider and adjust them as directed. It is normal to feel mild stretching, pulling, tightness, or discomfort as you do these exercises, but you should stop right away if you feel sudden pain or your pain gets worse. Exercises Single knee to chest Repeat these steps 3-5 times for each leg: Lie on your back on a firm bed or the floor with your legs extended. Bring one knee to your chest. Your other leg should stay extended and in contact with the floor. Hold your knee in place by grabbing your knee or thigh with both hands and hold. Pull on your knee until you feel a gentle stretch in your lower back or buttocks. Hold the stretch for 10-30 seconds. Slowly release and straighten your leg.  Pelvic tilt Repeat these steps 5-10 times: Lie on your back on a firm bed or the floor with your legs extended. Bend your knees so they are pointing toward the ceiling and your feet are flat on the floor. Tighten your lower abdominal muscles to press your lower back against the floor. This motion will tilt your pelvis so your tailbone points up toward the ceiling instead of pointing to your feet or the floor. With gentle tension and even breathing, hold this position for 5-10  seconds.  Cat-cow Repeat these steps until your lower back becomes more flexible: Get into a hands-and-knees position on a firm bed or the floor. Keep your hands under your shoulders, and keep your knees under your hips. You may place padding under your knees for comfort. Let your head hang down toward your chest. Contract your abdominal muscles and point your tailbone toward the floor so your lower back becomes rounded like the back of a cat. Hold this position for 5 seconds. Slowly lift your head, let your abdominal muscles relax, and point your tailbone up toward the ceiling so your back forms a sagging arch like the back of a cow. Hold this position for 5 seconds.  Press-ups Repeat these steps 5-10 times: Lie on your abdomen (face-down) on a firm bed or the floor. Place your palms near your head, about shoulder-width apart. Keeping your back as relaxed as possible and keeping your hips on the floor, slowly straighten your arms to raise the top half of your body and lift your shoulders. Do not use your back muscles to raise your upper torso. You may adjust the placement of your hands to make yourself more comfortable. Hold this position for 5 seconds while you keep your back relaxed. Slowly return to lying flat on the floor.  Bridges Repeat these steps 10 times: Lie on your back on a firm bed or the floor. Bend your knees so they are pointing toward the  ceiling and your feet are flat on the floor. Your arms should be flat at your sides, next to your body. Tighten your buttocks muscles and lift your buttocks off the floor until your waist is at almost the same height as your knees. You should feel the muscles working in your buttocks and the back of your thighs. If you do not feel these muscles, slide your feet 1-2 inches (2.5-5 cm) farther away from your buttocks. Hold this position for 3-5 seconds. Slowly lower your hips to the starting position, and allow your buttocks muscles to relax  completely. If this exercise is too easy, try doing it with your arms crossed over your chest. Abdominal crunches Repeat these steps 5-10 times: Lie on your back on a firm bed or the floor with your legs extended. Bend your knees so they are pointing toward the ceiling and your feet are flat on the floor. Cross your arms over your chest. Tip your chin slightly toward your chest without bending your neck. Tighten your abdominal muscles and slowly raise your torso high enough to lift your shoulder blades a tiny bit off the floor. Avoid raising your torso higher than that because it can put too much stress on your lower back and does not help to strengthen your abdominal muscles. Slowly return to your starting position.  Back lifts Repeat these steps 5-10 times: Lie on your abdomen (face-down) with your arms at your sides, and rest your forehead on the floor. Tighten the muscles in your legs and your buttocks. Slowly lift your chest off the floor while you keep your hips pressed to the floor. Keep the back of your head in line with the curve in your back. Your eyes should be looking at the floor. Hold this position for 3-5 seconds. Slowly return to your starting position.  Contact a health care provider if: Your back pain or discomfort gets much worse when you do an exercise. Your worsening back pain or discomfort does not lessen within 2 hours after you exercise. If you have any of these problems, stop doing these exercises right away. Do not do them again unless your health care provider says that you can. Get help right away if: You develop sudden, severe back pain. If this happens, stop doing the exercises right away. Do not do them again unless your health care provider says that you can. This information is not intended to replace advice given to you by your health care provider. Make sure you discuss any questions you have with your health care provider. Document Revised: 06/28/2022  Document Reviewed: 08/07/2020 Elsevier Patient Education  2024 [employer].

## 2023-11-10 NOTE — Progress Notes (Signed)
 Subjective  CC:  Chief Complaint  Patient presents with   Leg Pain    Pt stated that over the past 2 weeks he has been dealing with some left leg pain that throbs at times and the pain has gone up to his hip and to the lower back but it seems to be getting better.    HPI: Caleb Gibson is a 83 y.o. male who presents to the office today to address the problems listed above in the chief complaint. Discussed the use of AI scribe software for clinical note transcription with the patient, who gave verbal consent to proceed.  History of Present Illness Caleb Gibson is an 83 year old male who presents with thigh pain and limping.  He has been experiencing throbbing pain in his thigh for the past couple of weeks, which worsens with weight-bearing activities and causes limping. The pain is absent at night and does not disturb his sleep. While sitting, he feels some discomfort, but it is significantly worse when walking.  The pain occasionally radiates to the groin, side, and low back. He has a history of back pain from an injury many years ago, but he has not had significant back pain recently unless lifting something heavy. Currently, his back does not hurt.  He has been taking Aleve for the pain, which provides some relief but does not completely alleviate the symptoms. The limping has improved over time, and he is not experiencing numbness or tingling in the leg.  No bowel or bladder issues. No current back pain, numbness, or tingling in the leg. The pain does not bother him when lying down or sleeping.   Assessment  1. Left lumbar radiculopathy      Plan  Assessment and Plan Assessment & Plan Low back pain with possible radiculopathy, mild Throbbing pain in left thigh with radiation to groin, side, and low back, suggesting radiculopathy. No numbness or tingling. Suspected arthritis in low back and hip. Condition improving, no imaging needed. - Prescribed diclofenac (Voltaren) 1 pill twice  daily for a week. - Provided low back exercises for home. - Advised to return if symptoms worsen.    Follow up: prn No orders of the defined types were placed in this encounter.  Meds ordered this encounter  Medications   diclofenac (VOLTAREN) 75 MG EC tablet    Sig: Take 1 tablet (75 mg total) by mouth 2 (two) times daily.    Dispense:  30 tablet    Refill:  0     I reviewed the patients updated PMH, FH, and SocHx.  Patient Active Problem List   Diagnosis Date Noted   Parkinson's disease, akinetic/rigid subtype 02/03/2022    Priority: High   GAD (generalized anxiety disorder) 08/06/2021    Priority: High   Mild neurocognitive disorder 12/19/2020    Priority: High   Tubular adenoma of colon 06/15/2017    Priority: High   Mixed hyperlipidemia 09/27/2014    Priority: High   History of traumatic head injury     Priority: Medium    Idiopathic hypotension 01/07/2016    Priority: Medium    BPH (benign prostatic hyperplasia) 10/01/2011    Priority: Medium    Erectile dysfunction 10/01/2011    Priority: Medium    Osteoarthritis, knee 08/01/2010    Priority: Medium    Claustrophobia 07/08/2009    Priority: Medium    Basal cell carcinoma of skin 06/15/2017    Priority: Low   Simple cyst of kidney 11/12/2011  Priority: Low   Allergic rhinitis 08/27/2011    Priority: Low   REM behavioral disorder    Current Meds  Medication Sig   ALPRAZolam  (XANAX ) 0.5 MG tablet Take 1 tablet (0.5 mg total) by mouth daily as needed for anxiety.   carbidopa -levodopa  (SINEMET  IR) 25-100 MG tablet Take 1 tablet by mouth 3 (three) times daily. 7am/11am/4pm   cholecalciferol (VITAMIN D) 1000 units tablet Take 1,000 Units by mouth daily.    diclofenac (VOLTAREN) 75 MG EC tablet Take 1 tablet (75 mg total) by mouth 2 (two) times daily.   donepezil  (ARICEPT ) 10 MG tablet Take 1 tablet (10 mg total) by mouth daily.   Fluticasone Propionate (FLONASE NA) Place into the nose.   memantine  (NAMENDA )  10 MG tablet Take 1 tablet (10 mg total) by mouth 2 (two) times daily.   Multiple Vitamins-Minerals (CENTRUM SILVER) tablet Take 1 tablet by mouth daily.    OPTIVE 0.5-0.9 % ophthalmic solution Place 1 drop into the left eye 2 (two) times daily.   sertraline (ZOLOFT) 50 MG tablet Take 50 mg by mouth daily.   tamsulosin (FLOMAX) 0.4 MG CAPS capsule Take 0.4 mg by mouth.   Allergies: Patient is allergic to viagra  [sildenafil  citrate]. Family History: Patient family history includes Alcohol abuse in his maternal aunt and mother; Cancer in his maternal grandfather and maternal grandmother; Cirrhosis in his maternal aunt; Diabetes in his father; Glaucoma in his father; Heart disease in his maternal grandmother; Lymphoma in his mother; Melanoma in his mother; Mental illness in his mother; Prostate cancer in his father. Social History:  Patient  reports that he quit smoking about 29 years ago. His smoking use included cigarettes. He has never used smokeless tobacco. He reports current alcohol use of about 2.0 - 5.0 standard drinks of alcohol per week. He reports that he does not use drugs.  Review of Systems: Constitutional: Negative for fever malaise or anorexia Cardiovascular: negative for chest pain Respiratory: negative for SOB or persistent cough Gastrointestinal: negative for abdominal pain  Objective  Vitals: BP (!) 88/52   Pulse 75   Temp 97.7 F (36.5 C)   Ht 5\' 10"  (1.778 m)   Wt 218 lb 12.8 oz (99.2 kg)   SpO2 95%   BMI 31.39 kg/m  Back mild right paravertebral lumbar tenderness.  No spinal tenderness.  No SI joint tenderness.  No sciatic notch tenderness.  No hip tenderness.  Able to get to exam table unassisted.  Range of motion fairly good at the back.  Left hip with mildly decreased internal rotation but otherwise normal rotation with minimal pain.  Normal left knee exam Medication list was reconciled, printed and provided to the patient in AVS. Patient instructions and summary  information was reviewed with the patient as documented in the AVS. This note was prepared with assistance of Dragon voice recognition software. Occasional wrong-word or sound-a-like substitutions may have occurred due to the inherent limitations of voice recognition software

## 2023-11-17 ENCOUNTER — Telehealth: Payer: Self-pay

## 2023-11-17 ENCOUNTER — Encounter: Payer: Self-pay | Admitting: Neurology

## 2023-11-17 ENCOUNTER — Ambulatory Visit (INDEPENDENT_AMBULATORY_CARE_PROVIDER_SITE_OTHER): Payer: Medicare HMO | Admitting: Neurology

## 2023-11-17 VITALS — BP 107/70 | HR 69 | Ht 76.0 in | Wt 217.5 lb

## 2023-11-17 DIAGNOSIS — R4189 Other symptoms and signs involving cognitive functions and awareness: Secondary | ICD-10-CM

## 2023-11-17 DIAGNOSIS — G3184 Mild cognitive impairment, so stated: Secondary | ICD-10-CM | POA: Diagnosis not present

## 2023-11-17 DIAGNOSIS — G20C Parkinsonism, unspecified: Secondary | ICD-10-CM

## 2023-11-17 DIAGNOSIS — G20A1 Parkinson's disease without dyskinesia, without mention of fluctuations: Secondary | ICD-10-CM
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
## 2023-11-17 NOTE — Telephone Encounter (Signed)
 Community msg sent to [facility] [employer]  [phone]

## 2023-11-17 NOTE — Telephone Encounter (Signed)
-----   Message from Wess Hammed sent at 11/17/2023 12:47 PM EDT ----- I ordered ONO. Not on CPAP, using as screening tool.

## 2023-11-17 NOTE — Patient Instructions (Signed)
 Referral to speech therapy for cognitive training Overnight pulse oximetry for screen for low oxygen  Continue current medications  Follow up with Dr. Kitty Perkins

## 2023-11-17 NOTE — Progress Notes (Signed)
 Chart reviewed, agree above plan ?

## 2023-11-30 DIAGNOSIS — R0902 Hypoxemia: Secondary | ICD-10-CM | POA: Diagnosis not present

## 2023-11-30 DIAGNOSIS — G4730 Sleep apnea, unspecified: Secondary | ICD-10-CM | POA: Diagnosis not present

## 2023-12-02 DIAGNOSIS — F411 Generalized anxiety disorder: Secondary | ICD-10-CM | POA: Diagnosis not present

## 2023-12-02 DIAGNOSIS — F3341 Major depressive disorder, recurrent, in partial remission: Secondary | ICD-10-CM | POA: Diagnosis not present

## 2023-12-08 ENCOUNTER — Telehealth: Payer: Self-pay | Admitting: Neurology

## 2023-12-08 NOTE — Telephone Encounter (Signed)
 Placed in outgoing medical records box to be scanned

## 2023-12-08 NOTE — Telephone Encounter (Signed)
 Please call, overnight pulse oximetry test 11/30/23 from 4:28 AM-11:15 AM did not show any significant time of prolonged hypoxia.  He had 6 hours and 19 minutes of analyzable data, which is adequate.  98% of the time pulse oxygen was greater than 90%, it was only less than 88% less than 1% of the time.  Baseline was 93%.  If any concern of sleep apnea, we can move forward with a sleep consult.

## 2023-12-15 DIAGNOSIS — L814 Other melanin hyperpigmentation: Secondary | ICD-10-CM | POA: Diagnosis not present

## 2023-12-15 DIAGNOSIS — D2262 Melanocytic nevi of left upper limb, including shoulder: Secondary | ICD-10-CM | POA: Diagnosis not present

## 2023-12-15 DIAGNOSIS — D225 Melanocytic nevi of trunk: Secondary | ICD-10-CM | POA: Diagnosis not present

## 2023-12-15 DIAGNOSIS — L905 Scar conditions and fibrosis of skin: Secondary | ICD-10-CM | POA: Diagnosis not present

## 2023-12-15 DIAGNOSIS — D2261 Melanocytic nevi of right upper limb, including shoulder: Secondary | ICD-10-CM | POA: Diagnosis not present

## 2023-12-15 DIAGNOSIS — L821 Other seborrheic keratosis: Secondary | ICD-10-CM | POA: Diagnosis not present

## 2023-12-15 DIAGNOSIS — L570 Actinic keratosis: Secondary | ICD-10-CM | POA: Diagnosis not present

## 2023-12-15 DIAGNOSIS — Z85828 Personal history of other malignant neoplasm of skin: Secondary | ICD-10-CM | POA: Diagnosis not present

## 2023-12-16 ENCOUNTER — Encounter: Payer: Self-pay | Admitting: Neurology

## 2023-12-20 ENCOUNTER — Ambulatory Visit: Admitting: Family Medicine

## 2023-12-20 ENCOUNTER — Encounter: Payer: Self-pay | Admitting: Family Medicine

## 2023-12-20 ENCOUNTER — Ambulatory Visit (INDEPENDENT_AMBULATORY_CARE_PROVIDER_SITE_OTHER): Admitting: Family Medicine

## 2023-12-20 ENCOUNTER — Ambulatory Visit (INDEPENDENT_AMBULATORY_CARE_PROVIDER_SITE_OTHER)

## 2023-12-20 VITALS — BP 84/51 | HR 75 | Temp 97.7°F | Ht 76.0 in | Wt 214.6 lb

## 2023-12-20 DIAGNOSIS — M545 Low back pain, unspecified: Secondary | ICD-10-CM | POA: Diagnosis not present

## 2023-12-20 DIAGNOSIS — M25552 Pain in left hip: Secondary | ICD-10-CM

## 2023-12-20 DIAGNOSIS — I700 Atherosclerosis of aorta: Secondary | ICD-10-CM | POA: Diagnosis not present

## 2023-12-20 DIAGNOSIS — M5416 Radiculopathy, lumbar region: Secondary | ICD-10-CM

## 2023-12-20 DIAGNOSIS — G20A1 Parkinson's disease without dyskinesia, without mention of fluctuations: Secondary | ICD-10-CM

## 2023-12-20 DIAGNOSIS — M160 Bilateral primary osteoarthritis of hip: Secondary | ICD-10-CM | POA: Diagnosis not present

## 2023-12-20 DIAGNOSIS — M47816 Spondylosis without myelopathy or radiculopathy, lumbar region: Secondary | ICD-10-CM | POA: Diagnosis not present

## 2023-12-20 MED ORDER — DICLOFENAC SODIUM 75 MG PO TBEC: 75.0000 mg | DELAYED_RELEASE_TABLET | Freq: Two times a day (BID) | ORAL | 0 refills | Status: DC

## 2023-12-20 NOTE — Telephone Encounter (Signed)
 Called and relayed result. Pt wife doesn't want to proceed with sleep consult since the ono was good

## 2023-12-20 NOTE — Progress Notes (Signed)
 Subjective  CC:  Chief Complaint  Patient presents with   Leg Pain    Pt is still C/o hip and leg pain    HPI: Caleb Gibson is a 83 y.o. male who presents to the office today to address the problems listed above in the chief complaint. Discussed the use of AI scribe software for clinical note transcription with the patient, who gave verbal consent to proceed.  History of Present Illness Caleb Gibson is an 83 year old male who presents with persistent low back and groin pain.  He has persistent low back pain that sometimes radiates to the groin. Initially, the pain improved with anti-inflammatory medication but worsened after playing golf. He describes the pain as a six or seven out of ten, affecting daily activities and walking. The pain initially radiated into the thigh and persists to a lesser degree. He is not currently taking any medication for the pain but previously found relief with anti-inflammatory medication.  He mentions a recent sleep study with pending results and a scheduled speech therapy evaluation for difficulty with word retrieval. Additionally, he has an upcoming appointment with Dr. Candiss for further testing.  Due to the pain and hot weather, he has reduced his physical activity, although he attempts to walk on the treadmill or around the block when possible. He has been working on balance exercises at home and in physical therapy.   Assessment  1. Left lumbar radiculopathy   2. Left hip pain   3. Parkinson's disease without dyskinesia, unspecified whether manifestations fluctuate (HCC)      Plan  Assessment and Plan Assessment & Plan Low back pain with radiation to the thigh Chronic low back pain with intermittent radiation to the left thigh and groin. Pain improved with anti-inflammatory medication but recurred after physical activity. - Order x-rays of the back and hip to assess joint condition. - Prescribe another course of anti-inflammatory medication to be  taken with food. - Refer to physical therapy for back strengthening and exercises. - Advise continuation of home exercises provided previously.  Speech difficulties Experiencing difficulty with word retrieval and speech. Scheduled for a speech therapy evaluation. - Proceed with speech therapy evaluation on Wednesday.  General Health Maintenance Engaging in physical activity and balance exercises despite hot weather conditions. - Encourage continuation of balance exercises at home and in physical therapy.    Follow up: prn Orders Placed This Encounter  Procedures   DG Lumbar Spine Complete   DG HIP UNILAT W OR W/O PELVIS 2-3 VIEWS LEFT   Ambulatory referral to Physical Therapy   Meds ordered this encounter  Medications   diclofenac  (VOLTAREN ) 75 MG EC tablet    Sig: Take 1 tablet (75 mg total) by mouth 2 (two) times daily.    Dispense:  30 tablet    Refill:  0     I reviewed the patients updated PMH, FH, and SocHx.  Patient Active Problem List   Diagnosis Date Noted   Parkinson's disease, akinetic/rigid subtype 02/03/2022    Priority: High   GAD (generalized anxiety disorder) 08/06/2021    Priority: High   Mild neurocognitive disorder 12/19/2020    Priority: High   Tubular adenoma of colon 06/15/2017    Priority: High   Mixed hyperlipidemia 09/27/2014    Priority: High   History of traumatic head injury     Priority: Medium    Idiopathic hypotension 01/07/2016    Priority: Medium    BPH (benign prostatic hyperplasia) 10/01/2011  Priority: Medium    Erectile dysfunction 10/01/2011    Priority: Medium    Osteoarthritis, knee 08/01/2010    Priority: Medium    Claustrophobia 07/08/2009    Priority: Medium    Basal cell carcinoma of skin 06/15/2017    Priority: Low   Simple cyst of kidney 11/12/2011    Priority: Low   Allergic rhinitis 08/27/2011    Priority: Low   REM behavioral disorder    Current Meds  Medication Sig   ALPRAZolam  (XANAX ) 0.5 MG tablet  Take 1 tablet (0.5 mg total) by mouth daily as needed for anxiety.   carbidopa -levodopa  (SINEMET  IR) 25-100 MG tablet Take 1 tablet by mouth 3 (three) times daily. 7am/11am/4pm   carboxymethylcellulose (REFRESH PLUS) 0.5 % SOLN Place 1 drop into both eyes 2 (two) times daily as needed.   cholecalciferol (VITAMIN D) 1000 units tablet Take 1,000 Units by mouth daily.    diclofenac  (VOLTAREN ) 75 MG EC tablet Take 1 tablet (75 mg total) by mouth 2 (two) times daily.   donepezil  (ARICEPT ) 10 MG tablet Take 1 tablet (10 mg total) by mouth daily.   Fluticasone Propionate (FLONASE NA) Place into the nose.   memantine  (NAMENDA ) 10 MG tablet Take 1 tablet (10 mg total) by mouth 2 (two) times daily.   Multiple Vitamins-Minerals (CENTRUM SILVER) tablet Take 1 tablet by mouth daily.    sertraline (ZOLOFT) 50 MG tablet Take 50 mg by mouth daily.   tamsulosin (FLOMAX) 0.4 MG CAPS capsule Take 0.4 mg by mouth.   Allergies: Patient is allergic to viagra  [sildenafil  citrate]. Family History: Patient family history includes Alcohol abuse in his maternal aunt and mother; Cancer in his maternal grandfather and maternal grandmother; Cirrhosis in his maternal aunt; Diabetes in his father; Glaucoma in his father; Heart disease in his maternal grandmother; Lymphoma in his mother; Melanoma in his mother; Mental illness in his mother; Prostate cancer in his father. Social History:  Patient  reports that he quit smoking about 29 years ago. His smoking use included cigarettes. He has never used smokeless tobacco. He reports current alcohol use of about 2.0 - 5.0 standard drinks of alcohol per week. He reports that he does not use drugs.  Review of Systems: Constitutional: Negative for fever malaise or anorexia Cardiovascular: negative for chest pain Respiratory: negative for SOB or persistent cough Gastrointestinal: negative for abdominal pain  Objective  Vitals: BP (!) 84/51   Pulse 75   Temp 97.7 F (36.5 C)   Ht  6' 4 (1.93 m)   Wt 214 lb 9.6 oz (97.3 kg)   SpO2 96%   BMI 26.12 kg/m  General: no acute distress , A&Ox3 Gait: Stable Commons side effects, risks, benefits, and alternatives for medications and treatment plan prescribed today were discussed, and the patient expressed understanding of the given instructions. Patient is instructed to call or message via MyChart if he/she has any questions or concerns regarding our treatment plan. No barriers to understanding were identified. We discussed Red Flag symptoms and signs in detail. Patient expressed understanding regarding what to do in case of urgent or emergency type symptoms.  Medication list was reconciled, printed and provided to the patient in AVS. Patient instructions and summary information was reviewed with the patient as documented in the AVS. This note was prepared with assistance of Dragon voice recognition software. Occasional wrong-word or sound-a-like substitutions may have occurred due to the inherent limitations of voice recognition software

## 2023-12-20 NOTE — Telephone Encounter (Signed)
 Wife has called for the results to the overnight pulse oximetry test

## 2023-12-21 ENCOUNTER — Ambulatory Visit: Payer: Self-pay | Admitting: Family Medicine

## 2023-12-21 NOTE — Progress Notes (Signed)
 Please call patient:let him or his wife know that the back and hip xrays show mild arthritis changes only. This is good news.

## 2023-12-22 ENCOUNTER — Ambulatory Visit

## 2023-12-22 DIAGNOSIS — R41841 Cognitive communication deficit: Secondary | ICD-10-CM

## 2023-12-22 NOTE — Therapy (Signed)
 [facility] [address] [place], [place], [address] Phone: [phone]   Fax:  [phone]  Patient Details  Name: Caleb Gibson MRN: [id number] Date of Birth: 09/19/1940 Referring Provider:  Gayland Lauraine PARAS, NP  Encounter Date: 12/22/2023  Speech : ARRIVE-CANCEL SLP learned, 10 minutes into eval session today that pt is scheduled for neuropsych evaluation on 01/04/24. SLP will defer eval for memory compensations/techniques until after neuropsych eval is completed.  Pt re-scheduled ST eval to 01/18/24, at 1145.   Alaze Garverick, CCC-SLP 12/22/2023, 11:16 AM  [facility] [address] [place], [place], [address] Phone: [phone]   Fax:  [phone]

## 2024-01-04 ENCOUNTER — Encounter: Payer: Self-pay | Admitting: Psychology

## 2024-01-04 ENCOUNTER — Ambulatory Visit: Payer: Medicare HMO | Admitting: Psychology

## 2024-01-04 ENCOUNTER — Ambulatory Visit: Payer: Self-pay | Admitting: Psychology

## 2024-01-04 DIAGNOSIS — R4189 Other symptoms and signs involving cognitive functions and awareness: Secondary | ICD-10-CM

## 2024-01-04 DIAGNOSIS — G20A1 Parkinson's disease without dyskinesia, without mention of fluctuations: Secondary | ICD-10-CM | POA: Insufficient documentation

## 2024-01-04 DIAGNOSIS — F02B3 Dementia in other diseases classified elsewhere, moderate, with mood disturbance: Secondary | ICD-10-CM | POA: Diagnosis not present

## 2024-01-04 HISTORY — DX: Parkinson's disease without dyskinesia, without mention of fluctuations: G20.A1

## 2024-01-04 NOTE — Progress Notes (Signed)
   Psychometrician Note   Cognitive testing was administered to Vonn Aline by Lonell Jude, B.S. (psychometrist) under the supervision of Dr. Arthea KYM Maryland, Ph.D., ABPP, licensed psychologist on 01/04/2024. Mr. Cawood did not appear overtly distressed by the testing session per behavioral observation or responses across self-report questionnaires. Rest breaks were offered.    The battery of tests administered was selected by Dr. Arthea KYM Maryland, Ph.D., ABPP with consideration to Mr. Sedler's current level of functioning, the nature of his symptoms, emotional and behavioral responses during interview, level of literacy, observed level of motivation/effort, and the nature of the referral question. This battery was communicated to the psychometrist. Communication between Dr. Arthea KYM Maryland, Ph.D., ABPP and the psychometrist was ongoing throughout the evaluation and Dr. Arthea KYM Maryland, Ph.D., ABPP was immediately accessible at all times. Dr. Zachary C. Merz, Ph.D., ABPP provided supervision to the psychometrist on the date of this service to the extent necessary to assure the quality of all services provided.    Bretton Tandy will return within approximately 1-2 weeks for an interactive feedback session with Dr. Maryland at which time his test performances, clinical impressions, and treatment recommendations will be reviewed in detail. Mr. Renaldo understands he can contact our office should he require our assistance before this time.  A total of 135 minutes of billable time were spent face-to-face with Mr. Ozaki by the psychometrist. This includes both test administration and scoring time. Billing for these services is reflected in the clinical report generated by Dr. Arthea KYM Maryland, Ph.D., ABPP  This note reflects time spent with the psychometrician and does not include test scores or any clinical interpretations made by Dr. Maryland. The full report will follow in a separate note.

## 2024-01-04 NOTE — Progress Notes (Unsigned)
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 Enrollment in special education courses: Denied. History of LD/ADHD: Denied.   Employment: Retired. Mr. Mcaulay spent 3.5 years in the [employer]. 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   Evaluation Results:   Behavioral Observations: Mr. Hellmer was accompanied by his wife, arrived to his appointment on time, and was appropriately dressed and groomed. He appeared alert. Observed gait and station were within normal limits. Gross motor functioning appeared intact upon informal observation and no abnormal movements (e.g., tremors) were noted. His affect was flat at times but did range appropriately given the subject being discussed during interview. Spontaneous speech was fluent. Word finding difficulties were observed throughout his interview, as well as instances where he briefly lost his train of thought. Thought processes were otherwise coherent, organized, and normal in content. Insight into his cognitive difficulties appeared adequate.   During testing, sustained attention was appropriate. Task engagement was adequate and he persisted when challenged. Overall, Mr. Bruington was cooperative with the clinical interview and subsequent testing procedures.   Adequacy of Effort: The validity of neuropsychological  testing is limited by the extent to which the individual being tested may be assumed to have exerted adequate effort during testing. Mr. Curfman expressed his intention to perform to the best of his abilities and exhibited adequate task engagement and persistence. Scores across stand-alone and embedded performance validity measures were within expectation. As such, the results of the current evaluation are believed to be a valid representation of Mr. Pasquariello current cognitive functioning.  Test Results: Mr. Mamaril was poorly oriented at the time of the current evaluation. He incorrectly stated his age (85). He was also unable to state the current year, month, date, day of the week, or name of the current clinic.  Intellectual abilities based upon educational and vocational attainment were estimated to be in the average range. Premorbid abilities were estimated to be within the average range based upon a single-word reading test.   Processing speed was exceptionally low to well below average. Basic attention was average to well above average. More complex attention (e.g., working  memory) was well below average. Executive functioning was exceptionally low.  Assessed receptive language abilities were well below average. He had trouble across several aspects of this task, including conceptual questions (e.g., point to the shape that is the color of the sky) and understanding more complex sentence structure. Despite this, Mr. Briggs did not exhibit prominent difficulties comprehending task instructions and answered all questions asked of him appropriately. Assessed expressive language was variable. Phonemic fluency was below average, semantic fluency was exceptionally low to well below average, and confrontation naming was average.     Assessed visuospatial/visuoconstructional abilities were mildly variable, ranging from the well below average to average normative ranges.    Learning (i.e., encoding) of novel  verbal information was exceptionally low. Spontaneous delayed recall (i.e., retrieval) of previously learned information was variable, ranging from the exceptionally low to average normative ranges. Retention rates were 0% across a list learning task, 100% (raw score of 3) across a story learning task, and 61% across a figure drawing task. Performance across recognition tasks was variable, ranging from the well below average to average normative ranges, suggesting some evidence for information consolidation.   Results of emotional screening instruments suggested that recent symptoms of generalized anxiety were in the moderate range, while symptoms of depression were within the mild range. A screening instrument assessing recent sleep quality suggested the presence of minimal sleep dysfunction.  Table of Scores:     Informed Consent and Coding/Compliance:   The current evaluation represents a clinical evaluation for the purposes previously outlined by the referral source and is in no way reflective of a forensic evaluation.   Mr. Catala was provided with a verbal description of the nature and purpose of the present neuropsychological evaluation. Also reviewed were the foreseeable risks and/or discomforts and benefits of the procedure, limits of confidentiality, and mandatory reporting requirements of this provider. The patient was given the opportunity to ask questions and receive answers about the evaluation. Oral consent to participate was provided by the patient.   This evaluation was conducted by Arthea KYM Maryland, Ph.D., ABPP-CN, board certified clinical neuropsychologist. Mr. Moquin completed a clinical interview with Dr. Maryland, billed as one unit [phone], and 135 minutes of cognitive testing and scoring, billed as one unit [phone] and four additional units 96139. Psychometrist Lonell Jude, B.S. assisted Dr. Maryland with test administration and scoring procedures. As a separate and discrete service, one  unit [phone] and two units 96133 (160 minutes) were billed for Dr. Loralee time spent in interpretation and report writing.

## 2024-01-05 ENCOUNTER — Institutional Professional Consult (permissible substitution): Payer: Medicare HMO | Admitting: Psychology

## 2024-01-05 ENCOUNTER — Ambulatory Visit: Payer: Self-pay

## 2024-01-05 ENCOUNTER — Encounter: Payer: Self-pay | Admitting: Psychology

## 2024-01-10 ENCOUNTER — Other Ambulatory Visit: Payer: Self-pay

## 2024-01-10 ENCOUNTER — Ambulatory Visit: Admitting: Physical Therapy

## 2024-01-10 DIAGNOSIS — M6281 Muscle weakness (generalized): Secondary | ICD-10-CM

## 2024-01-10 DIAGNOSIS — M25552 Pain in left hip: Secondary | ICD-10-CM

## 2024-01-10 DIAGNOSIS — M5416 Radiculopathy, lumbar region: Secondary | ICD-10-CM | POA: Diagnosis not present

## 2024-01-10 DIAGNOSIS — R2689 Other abnormalities of gait and mobility: Secondary | ICD-10-CM

## 2024-01-10 NOTE — Therapy (Signed)
 OUTPATIENT PHYSICAL THERAPY LOWER EXTREMITY EVALUATION   Patient Name: Caleb Gibson MRN: [id number] DOB:1941-02-02, 83 y.o., male Today's Date: 01/10/2024  END OF SESSION:  PT End of Session - 01/10/24 1517     Visit Number 1    Number of Visits 16    Date for PT Re-Evaluation 03/06/24    Authorization Type [employer]    Authorization Time Period Needs auth    Progress Note Due on Visit 10    PT Start Time 1518    PT Stop Time 1556    PT Time Calculation (min) 38 min    Activity Tolerance Patient tolerated treatment well    Behavior During Therapy [facility] for tasks assessed/performed          Past Medical History:  Diagnosis Date   Allergic rhinitis 08/27/2011   Basal cell carcinoma of skin    BPH (benign prostatic hyperplasia) 10/01/2011   Had urology work up; psa normal   Claustrophobia    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 Onita [facility]   MMSE 25/30 08/2020, neurology. On nemenda and aricept .  MMSE 19/30 04/2023, on nemenda and aricept      Erectile dysfunction 10/01/2011   GAD (generalized anxiety disorder) 08/06/2021   History of prostate cancer 11/22/2017   dxd 11/2017: active surveillance then rads tx 2020   History of traumatic head injury    Idiopathic hypotension 01/07/2016    Normal stress Echo - 12/2014 Neg adrenal insufficiency work up by endocrine 2017  Formatting of this note might be different from the original. Normal stress Echo - 12/2014   Mixed hyperlipidemia 09/27/2014   Night terrors    Osteoarthritis, knee 08/01/2010   Parkinson's disease, akinetic/rigid subtype 02/03/2022   REM behavioral disorder    Simple cyst of kidney 11/12/2011   Overview:  Stable on CT scan/ no further imaging or work up needed/cla  Formatting of this note might be different from the original. Stable on CT scan/ no further imaging or work up needed/cla   Tubular adenoma of colon 06/15/2017    Colonoscopy 08/2016; rec repeat in 5 years.    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   Parkinson's disease, akinetic/rigid subtype 02/03/2022   GAD (generalized anxiety disorder) 08/06/2021   REM behavioral disorder    History of traumatic head injury    Basal cell carcinoma of skin 06/15/2017   Tubular adenoma of colon 06/15/2017   Idiopathic hypotension 01/07/2016   Mixed hyperlipidemia 09/27/2014   Simple cyst of kidney 11/12/2011   BPH (benign prostatic hyperplasia) 10/01/2011   Erectile dysfunction 10/01/2011   Allergic rhinitis 08/27/2011   Osteoarthritis, knee 08/01/2010   Claustrophobia 07/08/2009    PCP: Jodie Lavern CROME, MD  REFERRING PROVIDER: Jodie Lavern CROME, MD  REFERRING DIAG: M54.16 (ICD-10-CM) - Left lumbar radiculopathy M25.552 (ICD-10-CM) - Left hip pain  THERAPY DIAG:  Pain in left hip  Muscle weakness (generalized)  Other abnormalities of gait and mobility  Left lumbar radiculopathy  Rationale for Evaluation and Treatment: Rehabilitation  ONSET DATE: ~  6 months  SUBJECTIVE:   SUBJECTIVE STATEMENT: Pt reports he has Parkinson's and because of this he has had greater difficulty with conversations and exercise. Wife is present to help with subjective. Pt reports L>R posterior hip pain. Wife notes times that pt has limped. If pt sits for a while, he has difficulty getting out of the chair. Pt does use the treadmill and tries to walk daily. Does have [facility] [employer] [facility]. Did get trained on using the machine weights there but hasn't used it. Pt did get anti-inflammatory initially and it did help his hip pain as well as exercises from Dr. Jodie but didn't keep up with  it.   PERTINENT HISTORY: Parkinson's diagnosis for ~3 years  PAIN:  Are you having pain? Yes: NPRS scale: 0 currently,  Pain location: Post hip L>R (points towards glute med) Pain description: aching; can get grabbing/shooting pain sometimes Aggravating factors: Getting out of a chair, walking close to a mile, tender to the touch Relieving factors: Occasionally aleve  PRECAUTIONS: None  RED FLAGS: None   WEIGHT BEARING RESTRICTIONS: No  FALLS:  Has patient fallen in last 6 months? No  LIVING ENVIRONMENT: Lives with: lives with their spouse Lives in: House/apartment Stairs: 16 stairs to go upstairs Has following equipment at home: Single point cane  OCCUPATION: Retired  PLOF: Independent  PATIENT GOALS: Improve hip pain  NEXT MD VISIT: as needed  OBJECTIVE:  Note: Objective measures were completed at Evaluation unless otherwise noted.  DIAGNOSTIC FINDINGS: Hip and Lumbar X-rays from 12/20/23 IMPRESSION: Mild degenerative changes of the bilateral hips.  IMPRESSION: Mild degenerative changes of the lumbar spine.  PATIENT SURVEYS:  Lower Extremity Functional Score: 48 / 80 = 60.0 %  COGNITION: Overall cognitive status: Within functional limits for tasks assessed     SENSATION: WFL  EDEMA:    MUSCLE LENGTH: Hamstrings: Right 70 deg; Left 70 deg Thomas test: Flat on bed bilat  POSTURE: rounded shoulders, forward head, and increased thoracic kyphosis  PALPATION: TTP L>R glute med, piriformis  LUMBAR ROM:   Active  A/PROM  eval  Flexion 75%  Extension 50%  Right lateral flexion 75%  Left lateral flexion 75%  Right rotation 100%  Left rotation 100%   (Blank rows = not tested)   LOWER EXTREMITY ROM: WNL  LOWER EXTREMITY MMT:  MMT Right eval Left eval  Hip flexion 4 4  Hip extension 3 3  Hip abduction 4- 3+  Hip adduction    Hip internal rotation 4- 4-  Hip external rotation 4- 3+  Knee flexion 4 4  Knee extension 5 5  Ankle  dorsiflexion    Ankle plantarflexion    Ankle inversion    Ankle eversion     (Blank rows = not tested)  LOWER EXTREMITY SPECIAL TESTS:  Hip special tests: Belvie (FABER) test: negative, Thomas test: negative, and Hip scouring test: negative  FUNCTIONAL TESTS:  5 times sit to stand: 12.24 sec slightly dizzy  GAIT: Distance walked: Into clinic Assistive device utilized: None Level of assistance: Complete Independence Comments: Normal reciprocal pattern  TREATMENT DATE: 01/10/24 See HEP below Attempted bridging but pt reports hip pain    PATIENT EDUCATION:  Education details: Exam findings, POC, initial HEP Person educated: Patient Education method: Explanation, Demonstration, and Handouts Education comprehension: verbalized understanding, returned demonstration, and needs further education  HOME EXERCISE PROGRAM: Access Code: [email or web address] URL: [email or web address] Date: 01/10/2024 Prepared by: Inesha Sow April Earnie Starring  Exercises - Clamshell with Resistance  - 1 x daily - 7 x weekly - 2-3 sets - 10 reps - Supine Gluteal Sets  - 1 x daily - 7 x weekly - 2-3 sets - 10 reps  ASSESSMENT:  CLINICAL IMPRESSION: Patient is an 83 y.o. M who was seen today for physical therapy evaluation and treatment for L>R hip and low back pain. PMH is significant for Parkinson's Disease and hypotension (pt reports history of systolic BP in the 80s some days). Assessment is significant for L>R glute med weakness, gross bilat glute max weakness, and some general lumbar tightness/hypomobility affecting his ability to tolerate prolonged standing, walking, transfers, and lifting tasks. Pt will benefit from PT to maximize his level of function.   OBJECTIVE IMPAIRMENTS: decreased activity tolerance, decreased endurance, decreased mobility, difficulty walking,  decreased strength, increased fascial restrictions, increased muscle spasms, impaired flexibility, improper body mechanics, postural dysfunction, and pain.   ACTIVITY LIMITATIONS: carrying, lifting, standing, squatting, stairs, transfers, and locomotion level  PARTICIPATION LIMITATIONS: meal prep, cleaning, driving, shopping, community activity, and yard work  PERSONAL FACTORS: Age, Fitness, Past/current experiences, and Time since onset of injury/illness/exacerbation are also affecting patient's functional outcome.   REHAB POTENTIAL: Good  CLINICAL DECISION MAKING: Evolving/moderate complexity  EVALUATION COMPLEXITY: Moderate   GOALS: Goals reviewed with patient? Yes  SHORT TERM GOALS: Target date: 02/07/2024  Pt will be ind with initial HEP Baseline: Goal status: INITIAL  2.  Pt will demo improved lumbar ROM by at least 10% Baseline:  Goal status: INITIAL  3.  Pt will report improved pain by >/=25% Baseline:  Goal status: INITIAL   LONG TERM GOALS: Target date: 03/06/2024   Pt will be ind with management and progression of HEP Baseline:  Goal status: INITIAL  2.  Pt will have improved LEFS score to >/=70% to demo MCID Baseline:  Goal status: INITIAL  3.  Pt will report improved pain by >/=50% Baseline:  Goal status: INITIAL  4.  Pt will demo at least 4/5 bilat LE strength for improved standing tolerance Baseline:  Goal status: INITIAL    PLAN:  PT FREQUENCY: 2x/week  PT DURATION: 8 weeks  PLANNED INTERVENTIONS: 97164- PT Re-evaluation, 97110-Therapeutic exercises, 97530- Therapeutic activity, 97112- Neuromuscular re-education, 97535- Self Care, 02859- Manual therapy, [phone]- Gait training, [phone]- Electrical stimulation (unattended), [phone]- Ionotophoresis 4mg /ml Dexamethasone, 79439 (1-2 muscles), 20561 (3+ muscles)- Dry Needling, Patient/Family education, Balance training, Stair training, Taping, Joint mobilization, Spinal mobilization, Cryotherapy, and Moist  heat  PLAN FOR NEXT SESSION: Assess response to HEP. Strengthen glute med/hips. Work on standing stability.    Yeny Schmoll April Ma L Teira Arcilla, PT, DPT 01/10/2024, 5:12 PM

## 2024-01-12 ENCOUNTER — Ambulatory Visit: Payer: Medicare HMO | Admitting: Psychology

## 2024-01-12 DIAGNOSIS — G20A1 Parkinson's disease without dyskinesia, without mention of fluctuations: Secondary | ICD-10-CM | POA: Diagnosis not present

## 2024-01-12 DIAGNOSIS — F02A3 Dementia in other diseases classified elsewhere, mild, with mood disturbance: Secondary | ICD-10-CM

## 2024-01-12 NOTE — Progress Notes (Signed)
   Neuropsychology Feedback Session Jolynn DEL. [facility] Department of Neurology  Reason for Referral:   Caleb Gibson is a 83 y.o. right-handed Caucasian male referred by Modena Callander, M.D., to characterize his current cognitive functioning and assist with diagnostic clarity and treatment planning in the context of a prior mild neurocognitive disorder due to Parkinson's disease and concern for progressive cognitive decline.   Feedback:   [name] completed a comprehensive neuropsychological evaluation on 01/04/2024. Please refer to that encounter for the full report and recommendations. Briefly, results suggested significant impairment surrounding processing speed, complex attention, executive functioning, receptive language, semantic fluency, and encoding (i.e., learning) aspects of memory. Further performance variability was exhibited across visuospatial abilities and both delayed retrieval and recognition aspects of memory. Relative to his initial evaluation in July 2022, prominent decline has been exhibited across processing speed, executive functioning, receptive language, and encoding (i.e., learning) aspects of memory. More mild decline could be argued across complex attention and confrontation naming. Regarding the cause for his dementia presentation, Dr. Evonnie previously theorized the akinetic/rigid subtype of Parkinson's disease. Current testing patterns are quite reasonable given this suspected etiology, as is evidence for progressive cognitive decline in the domains described above. His abnormal DaTscan  further aligns with a parkinsonian presentation. An underlying Parkinson's disease dementia presentation appears most likely.   [name] was accompanied by his wife during the current feedback session. Content of the current session focused on the results of his neuropsychological evaluation. [name] was given the opportunity to ask questions and his questions were answered. He  was encouraged to reach out should additional questions arise. A copy of his report was provided at the conclusion of the visit.      One unit 96132 (31 minutes) was billed for Dr. Loralee time spent preparing for, conducting, and documenting the current feedback session with [name].

## 2024-01-18 ENCOUNTER — Ambulatory Visit: Attending: Neurology

## 2024-01-18 DIAGNOSIS — R4189 Other symptoms and signs involving cognitive functions and awareness: Secondary | ICD-10-CM | POA: Insufficient documentation

## 2024-01-18 DIAGNOSIS — R41841 Cognitive communication deficit: Secondary | ICD-10-CM | POA: Insufficient documentation

## 2024-01-18 DIAGNOSIS — G20C Parkinsonism, unspecified: Secondary | ICD-10-CM | POA: Diagnosis not present

## 2024-01-18 DIAGNOSIS — G3184 Mild cognitive impairment, so stated: Secondary | ICD-10-CM | POA: Insufficient documentation

## 2024-01-18 NOTE — Therapy (Addendum)
 OUTPATIENT SPEECH LANGUAGE PATHOLOGY PARKINSON'S EVALUATION   Patient Name: Caleb Gibson MRN: [id number] DOB:10-27-1940, 83 y.o., male Today's Date: 01/18/2024  PCP: Jodie Gammons, MD REFERRING PROVIDER: Gayland Credit, NP  END OF SESSION:  End of Session - 01/18/24 1323     Visit Number 1    Number of Visits 17    Date for SLP Re-Evaluation 03/17/24    SLP Start Time 1148    SLP Stop Time  1232    SLP Time Calculation (min) 44 min    Activity Tolerance Patient tolerated treatment well          Past Medical History:  Diagnosis Date   Allergic rhinitis 08/27/2011   Basal cell carcinoma of skin    BPH (benign prostatic hyperplasia) 10/01/2011   Had urology work up; psa normal   Claustrophobia    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 Onita [facility]   MMSE 25/30 08/2020, neurology. On nemenda and aricept .  MMSE 19/30 04/2023, on nemenda and aricept      Erectile dysfunction 10/01/2011   GAD (generalized anxiety disorder) 08/06/2021   History of prostate cancer 11/22/2017   dxd 11/2017: active surveillance then rads tx 2020   History of traumatic head injury    Idiopathic hypotension 01/07/2016    Normal stress Echo - 12/2014 Neg adrenal insufficiency work up by endocrine 2017  Formatting of this note might be different from the original. Normal stress Echo - 12/2014   Mixed hyperlipidemia 09/27/2014   Night terrors    Osteoarthritis, knee 08/01/2010   Parkinson's disease, akinetic/rigid subtype 02/03/2022   REM behavioral disorder    Simple cyst of kidney 11/12/2011   Overview:  Stable on CT scan/ no further imaging or work up needed/cla  Formatting of this note might be different from the original. Stable on CT scan/ no further imaging or work up needed/cla   Tubular adenoma of colon 06/15/2017   Colonoscopy 08/2016; rec repeat in 5 years.    Past Surgical History:  Procedure Laterality Date    APPENDECTOMY     DENTAL SURGERY     Had a tooth pulled in 01/2017   KNEE ARTHROSCOPY WITH MENISCAL REPAIR Right    MOHS SURGERY     RHINOPLASTY     TONSILLECTOMY AND ADENOIDECTOMY     Patient Active Problem List   Diagnosis Date Noted   Dementia due to Parkinson's disease 01/04/2024   Parkinson's disease, akinetic/rigid subtype 02/03/2022   GAD (generalized anxiety disorder) 08/06/2021   REM behavioral disorder    History of traumatic head injury    Basal cell carcinoma of skin 06/15/2017   Tubular adenoma of colon 06/15/2017   Idiopathic hypotension 01/07/2016   Mixed hyperlipidemia 09/27/2014   Simple cyst of kidney 11/12/2011   BPH (benign prostatic hyperplasia) 10/01/2011   Erectile dysfunction 10/01/2011   Allergic rhinitis 08/27/2011   Osteoarthritis, knee 08/01/2010   Claustrophobia 07/08/2009    ONSET DATE: script 11/17/23  REFERRING DIAG: G31.84 (ICD-10-CM) - Mild neurocognitive disorder R41.89 (ICD-10-CM) - Cognitive deficits G20.C (ICD-10-CM) - Parkinsonism, unspecified Parkinsonism type (HCC)  THERAPY DIAG:  Cognitive communication deficit  Rationale for Evaluation and Treatment: Rehabilitation  SUBJECTIVE:   SUBJECTIVE STATEMENT: I don't know (when asked about pain level, after fast-paced explanation by SLP) He has a hard time getting his thoughts out sometimes.  Pt accompanied by: significant other  PERTINENT HISTORY:  Dr. Richie 01/12/24: Feedback:  Mr. Dority completed a comprehensive neuropsychological evaluation on 01/04/2024. Please refer to that encounter for the full report and recommendations. Briefly, results suggested significant impairment surrounding processing speed, complex attention, executive functioning, receptive language, semantic fluency, and encoding (i.e., learning) aspects of memory. Further performance variability was exhibited across visuospatial abilities and both delayed retrieval and recognition aspects of memory. Relative to his  initial evaluation in July 2022, prominent decline has been exhibited across processing speed, executive functioning, receptive language, and encoding (i.e., learning) aspects of memory. More mild decline could be argued across complex attention and confrontation naming. Regarding the cause for his dementia presentation, Dr. Evonnie previously theorized the akinetic/rigid subtype of Parkinson's disease. Current testing patterns are quite reasonable given this suspected etiology, as is evidence for progressive cognitive decline in the domains described above. His abnormal DaTscan  further aligns with a parkinsonian presentation. An underlying Parkinson's disease dementia presentation appears most likely.    Mr. Lease was accompanied by his wife during the current feedback session. Content of the current session focused on the results of his neuropsychological evaluation. Mr. Sandate was given the opportunity to ask questions and his questions were answered. He was encouraged to reach out should additional questions arise. A copy of his report was provided at the conclusion of the visit.   PAIN:  Are you having pain? Yes: NPRS scale: 3-4/10 Pain location: lt calf Pain description: soreness, after treadmill this morning  FALLS: Has patient fallen in last 6 months?  No  LIVING ENVIRONMENT: Lives with: lives with their spouse Lives in: House/apartment  PLOF:  Level of assistance: Independent with ADLs, Independent with IADLs Employment: Retired  PATIENT GOALS: improve memory and communication  OBJECTIVE:  Note: Objective measures were completed at Evaluation unless otherwise noted.   COGNITION: Overall cognitive status: Impaired and History of cognitive impairments - at baseline ; pt and wife report some sx of ADD/ADHD prior to PD diagnosis Areas of impairment: Attention, Memory, and Following commands; for mrore details see pertinent history above. Standardized Testing: Hopkins Verbal Learning Test  (HVLT):RECALL 2/12(below WNL), 3/12 (below WNL), 4/12 (Below WNL); RECOGNTION: 11/12 (below WNL)  MOTOR SPEECH: Overall motor speech: Appears intact Respiration: thoracic breathing and diaphragmatic/abdominal breathing Phonation: normal Resonance: WFL Articulation: Appears intact Intelligibility: Intelligible Motor planning: Appears intact  ORAL MOTOR EXAMINATION: Overall status: Impaired: Labial: Bilateral (Coordination) Lingual: Bilateral (Coordination) Comments: Pt's difficulty with coordination in nonspeech tasks did not appear to carry over into speech tasks whatsoever.  OBJECTIVE VOICE ASSESSMENT: Conversational loudness average: 71 dB Conversational loudness range: 63-79 dB Voice quality: normal Comments: conversational volume was WNL  Completed audio recording of patients baseline voice without cueing from SLP: No  Pt does not report difficulty with swallowing which does not warrant further evaluation. [name] agrees.  PATIENT REPORTED OUTCOME MEASURES (PROM): Cognitive Function: provided to wife and pt 01/18/24 and asked to return next session.  TREATMENT DATE:   01/18/24: Discussed some compensations for pt and wife given results of neuropsych testing. Educated about situations that would make communication difficult, given report from pt and wife.   PATIENT EDUCATION: Education details: see treatment date today's date Person educated: Patient and Spouse Education method: Medical illustrator Education comprehension: verbalized understanding, returned demonstration, verbal cues required, and needs further education  HOME EXERCISE PROGRAM: Use compensations at home as suggested.   GOALS: Goals reviewed with patient? No  SHORT TERM GOALS: Target date: 02/18/24  Pt will tell SLP two compensatory strategies he could use to improve  communication, in 3 sessions Baseline: Goal status: INITIAL  2.  Pt will use one compensation given min A to do so, in/between two sessions Baseline:  Goal status: INITIAL  3.  To enhance pt's ability to communicate, pt's wife will demo appropriate compensations in two sessions Baseline:  Goal status: INITIAL   LONG TERM GOALS: Target date: 03/17/24  Pt will improve PROM compared to initial measurement Baseline:  Goal status: INITIAL  2.  To enhance pt's ability to communicate, pt's wife will demo appropriate compensations in two sessions after 02/18/24 Baseline:  Goal status: INITIAL  3.  Pt will use two compensatory strategies to improve communication in/report use of have use reported, between 3 sessions Baseline:  Goal status: INITIAL   ASSESSMENT:  CLINICAL IMPRESSION: Patient is a 83 y.o. M who was seen today for assessment of cognitive linguistics/memory in light of neuropsych eval last month with attention, processing, executive function, and memory identified as deficit areas. Given pt's performance today with Hopkins Verbal Learning Test as well as with pt and wife report, SLP suspects pt's processing speed and attention are negatively impacting his ability to recall things during the day. Wife provided two examples - she asked pt what was going on in a show after being away and pt stated I don't know, and not recalling to open the kitchen window after saying he would do so after completing another task very soon afterwards. [name] noted to speak in a faster rate of speech with pt so SLP provided education following the evaluation, along with other strategies to use in the aforementioned situations.   OBJECTIVE IMPAIRMENTS: Objective impairments include attention, memory, executive functioning, and receptive language. These impairments are limiting patient from managing medications, managing appointments, managing finances, household responsibilities, ADLs/IADLs, and  effectively communicating at home and in community.Factors affecting potential to achieve goals and functional outcome are ability to learn/carryover information and severity of impairments.. Patient will benefit from skilled SLP services to address above impairments and improve overall function.  REHAB POTENTIAL: Good  PLAN:  SLP FREQUENCY: 1-2x/week  SLP DURATION: 8 weeks  PLANNED INTERVENTIONS: Environmental controls, Cueing hierachy, Cognitive reorganization, Internal/external aids, Functional tasks, SLP instruction and feedback, Compensatory strategies, Patient/family education, and 07492 Treatment of speech (30 or 45 min)     Kale Dols, CCC-SLP 01/18/2024, 1:26 PM  Referring diagnosis? G31.84 (ICD-10-CM) - Mild neurocognitive disorder R41.89 (ICD-10-CM) - Cognitive deficits G20.C (ICD-10-CM) - Parkinsonism, unspecified Parkinsonism type (HCC) Treatment diagnosis? (if different than referring diagnosis) Cognitive-Communication Deficit   What was this (referring dx) caused by? []  Surgery []  Fall [x]  Ongoing issue []  Arthritis []  Other: ____________  Laterality: N/A  Check all possible CPT codes:  *CHOOSE 10 OR LESS*    See Planned Interventions listed in the Plan section of the Evaluation.

## 2024-01-24 ENCOUNTER — Ambulatory Visit

## 2024-01-24 DIAGNOSIS — C61 Malignant neoplasm of prostate: Secondary | ICD-10-CM | POA: Diagnosis not present

## 2024-01-24 DIAGNOSIS — G20C Parkinsonism, unspecified: Secondary | ICD-10-CM | POA: Diagnosis not present

## 2024-01-24 DIAGNOSIS — R41841 Cognitive communication deficit: Secondary | ICD-10-CM | POA: Diagnosis not present

## 2024-01-24 DIAGNOSIS — R4189 Other symptoms and signs involving cognitive functions and awareness: Secondary | ICD-10-CM | POA: Diagnosis not present

## 2024-01-24 DIAGNOSIS — G3184 Mild cognitive impairment, so stated: Secondary | ICD-10-CM | POA: Diagnosis not present

## 2024-01-24 NOTE — Therapy (Signed)
 OUTPATIENT SPEECH LANGUAGE PATHOLOGY TREATMENT   Patient Name: Caleb Gibson MRN: [id number] DOB:[birth year], 83 y.o., male Today's Date: 01/24/2024  PCP: Jodie Gammons, MD REFERRING PROVIDER: Gayland Credit, NP  END OF SESSION:  End of Session - 01/24/24 1304     Visit Number 2    Number of Visits 17    Date for SLP Re-Evaluation 03/17/24    SLP Start Time 1241   arrived 1237   SLP Stop Time  1315    SLP Time Calculation (min) 34 min    Activity Tolerance Patient tolerated treatment well           Past Medical History:  Diagnosis Date   Allergic rhinitis 08/27/2011   Basal cell carcinoma of skin    BPH (benign prostatic hyperplasia) 10/01/2011   Had urology work up; psa normal   Claustrophobia    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 Onita [facility]rology   MMSE 25/30 08/2020, neurology. On nemenda and aricept .  MMSE 19/30 04/2023, on nemenda and aricept      Erectile dysfunction 10/01/2011   GAD (generalized anxiety disorder) 08/06/2021   History of prostate cancer 11/22/2017   dxd 11/2017: active surveillance then rads tx 2020   History of traumatic head injury    Idiopathic hypotension 01/07/2016    Normal stress Echo - 12/2014 Neg adrenal insufficiency work up by endocrine 2017  Formatting of this note might be different from the original. Normal stress Echo - 12/2014   Mixed hyperlipidemia 09/27/2014   Night terrors    Osteoarthritis, knee 08/01/2010   Parkinson's disease, akinetic/rigid subtype 02/03/2022   REM behavioral disorder    Simple cyst of kidney 11/12/2011   Overview:  Stable on CT scan/ no further imaging or work up needed/cla  Formatting of this note might be different from the original. Stable on CT scan/ no further imaging or work up needed/cla   Tubular adenoma of colon 06/15/2017   Colonoscopy 08/2016; rec repeat in 5 years.    Past Surgical History:  Procedure Laterality Date    APPENDECTOMY     DENTAL SURGERY     Had a tooth pulled in 01/2017   KNEE ARTHROSCOPY WITH MENISCAL REPAIR Right    MOHS SURGERY     RHINOPLASTY     TONSILLECTOMY AND ADENOIDECTOMY     Patient Active Problem List   Diagnosis Date Noted   Dementia due to Parkinson's disease 01/04/2024   Parkinson's disease, akinetic/rigid subtype 02/03/2022   GAD (generalized anxiety disorder) 08/06/2021   REM behavioral disorder    History of traumatic head injury    Basal cell carcinoma of skin 06/15/2017   Tubular adenoma of colon 06/15/2017   Idiopathic hypotension 01/07/2016   Mixed hyperlipidemia 09/27/2014   Simple cyst of kidney 11/12/2011   BPH (benign prostatic hyperplasia) 10/01/2011   Erectile dysfunction 10/01/2011   Allergic rhinitis 08/27/2011   Osteoarthritis, knee 08/01/2010   Claustrophobia 07/08/2009    ONSET DATE: script 11/17/23  REFERRING DIAG: G31.84 (ICD-10-CM) - Mild neurocognitive disorder R41.89 (ICD-10-CM) - Cognitive deficits G20.C (ICD-10-CM) - Parkinsonism, unspecified Parkinsonism type (HCC)  THERAPY DIAG:  Cognitive communication deficit  Rationale for Evaluation and Treatment: Rehabilitation  SUBJECTIVE:   SUBJECTIVE STATEMENT: Somebody came at me this morning. ([name] questioned this)  Pt accompanied by: significant other  PERTINENT HISTORY:  Dr. Richie 01/12/24: Feedback:   Mr. Wessell completed a comprehensive neuropsychological evaluation on 01/04/2024. Please  refer to that encounter for the full report and recommendations. Briefly, results suggested significant impairment surrounding processing speed, complex attention, executive functioning, receptive language, semantic fluency, and encoding (i.e., learning) aspects of memory. Further performance variability was exhibited across visuospatial abilities and both delayed retrieval and recognition aspects of memory. Relative to his initial evaluation in July 2022, prominent decline has been exhibited across  processing speed, executive functioning, receptive language, and encoding (i.e., learning) aspects of memory. More mild decline could be argued across complex attention and confrontation naming. Regarding the cause for his dementia presentation, Dr. Evonnie previously theorized the akinetic/rigid subtype of Parkinson's disease. Current testing patterns are quite reasonable given this suspected etiology, as is evidence for progressive cognitive decline in the domains described above. His abnormal DaTscan  further aligns with a parkinsonian presentation. An underlying Parkinson's disease dementia presentation appears most likely.    Mr. Ludington was accompanied by his wife during the current feedback session. Content of the current session focused on the results of his neuropsychological evaluation. Mr. Deeg was given the opportunity to ask questions and his questions were answered. He was encouraged to reach out should additional questions arise. A copy of his report was provided at the conclusion of the visit.   PAIN:  Are you having pain? Yes: NPRS scale: 3-4/10 Pain location: lt calf Pain description: soreness, after treadmill this morning  FALLS: Has patient fallen in last 6 months?  No   PATIENT GOALS: improve memory and communication  OBJECTIVE:  Note: Objective measures were completed at Evaluation unless otherwise noted.    PATIENT REPORTED OUTCOME MEASURES (PROM): Cognitive Function: returned 01/24/24 however unclear who used what pen so SLP to inquire at next visit.                                                                                                                            TREATMENT DATE:   01/24/24: SLP needs to ask pt/wife which pens were used to score PROM.  SLP consulted with pt and wife about situations in which pt demonstrated difficulty since last session. Pt has difficult with limited communication during dinner Saturday night at [facility]. SLP suggested going during a  quieter time, and/or not at such a busy restaurant. SLP educated about pt or wife how to self-advocate to ask speakers to slow rate and allow Dene more time to process. Pt and wife stated that he/she/they could talk with friend Hadassah, and [name]'s sister who is coming in September about these modifications. [name] continues to use a faster speech rate today during session, but slower than evaluation. Dene appeared to comprehend [name] when asking him direct questions today which was different than evaluation.   01/18/24: Discussed some compensations for pt and wife given results of neuropsych testing. Educated about situations that would make communication difficult, given report from pt and wife.   PATIENT EDUCATION: Education details: see treatment date today's date Person educated: Patient and Spouse Education method: Explanation and  Demonstration Education comprehension: verbalized understanding, returned demonstration, verbal cues required, and needs further education  HOME EXERCISE PROGRAM: Use compensations at home as suggested.   GOALS: Goals reviewed with patient? No  SHORT TERM GOALS: Target date: 02/18/24  Pt will tell SLP two compensatory strategies he could use to improve communication, in 3 sessions Baseline: Goal status: INITIAL  2.  Pt will use one compensation given min A to do so, in/between two sessions Baseline:  Goal status: INITIAL  3.  To enhance pt's ability to communicate, pt's wife will demo appropriate compensations in two sessions Baseline:  Goal status: INITIAL   LONG TERM GOALS: Target date: 03/17/24  Pt will improve PROM compared to initial measurement Baseline:  Goal status: INITIAL  2.  To enhance pt's ability to communicate, pt's wife will demo appropriate compensations in two sessions after 02/18/24 Baseline:  Goal status: INITIAL  3.  Pt will use two compensatory strategies to improve communication in/report use of have use reported, between  3 sessions Baseline:  Goal status: INITIAL   ASSESSMENT:  CLINICAL IMPRESSION: Patient is a 83 y.o. M who was seen today for treatment of cognitive linguistics/memory in light of neuropsych eval last month with attention, processing, executive function, and memory identified as deficit areas. [name]'s rate of speech with pt was slower today and pt appeared to comprehend. SLP cont to provide education re: strategies to use in more difficult communication situations, in addition to encouraging pt to self-advocate with people he feels comfortable doing this with.   OBJECTIVE IMPAIRMENTS: Objective impairments include attention, memory, executive functioning, and receptive language. These impairments are limiting patient from managing medications, managing appointments, managing finances, household responsibilities, ADLs/IADLs, and effectively communicating at home and in community.Factors affecting potential to achieve goals and functional outcome are ability to learn/carryover information and severity of impairments.. Patient will benefit from skilled SLP services to address above impairments and improve overall function.  REHAB POTENTIAL: Good  PLAN:  SLP FREQUENCY: 1-2x/week  SLP DURATION: 8 weeks  PLANNED INTERVENTIONS: Environmental controls, Cueing hierachy, Cognitive reorganization, Internal/external aids, Functional tasks, SLP instruction and feedback, Compensatory strategies, Patient/family education, and 07492 Treatment of speech (30 or 45 min)     Geniyah Eischeid, CCC-SLP 01/24/2024, 1:04 PM  Referring diagnosis? G31.84 (ICD-10-CM) - Mild neurocognitive disorder R41.89 (ICD-10-CM) - Cognitive deficits G20.C (ICD-10-CM) - Parkinsonism, unspecified Parkinsonism type (HCC) Treatment diagnosis? (if different than referring diagnosis) Cognitive-Communication Deficit   What was this (referring dx) caused by? []  Surgery []  Fall [x]  Ongoing issue []  Arthritis []  Other:  ____________  Laterality: N/A  Check all possible CPT codes:  *CHOOSE 10 OR LESS*    See Planned Interventions listed in the Plan section of the Evaluation.

## 2024-01-26 ENCOUNTER — Encounter

## 2024-01-27 ENCOUNTER — Ambulatory Visit

## 2024-01-27 DIAGNOSIS — R41841 Cognitive communication deficit: Secondary | ICD-10-CM

## 2024-01-27 DIAGNOSIS — G3184 Mild cognitive impairment, so stated: Secondary | ICD-10-CM | POA: Diagnosis not present

## 2024-01-27 DIAGNOSIS — G20C Parkinsonism, unspecified: Secondary | ICD-10-CM | POA: Diagnosis not present

## 2024-01-27 DIAGNOSIS — R4189 Other symptoms and signs involving cognitive functions and awareness: Secondary | ICD-10-CM | POA: Diagnosis not present

## 2024-01-27 NOTE — Patient Instructions (Addendum)
  What you can do to make sure you understand what is said, but also to give you more of a chance to stay involved in a conversation Ask people to slow down their speech so you can process easier Ask people to give you some extra time to respond Repeat back/confirm you heard the correct thing when hearing directions or other things Write down things you need to remember  Look into the person's eyes when you are listening to them

## 2024-01-27 NOTE — Therapy (Signed)
 OUTPATIENT SPEECH LANGUAGE PATHOLOGY TREATMENT   Patient Name: Caleb Gibson MRN: [id number] DOB:1940/07/28, 83 y.o., male Today's Date: 01/27/2024  PCP: Jodie Gammons, MD REFERRING PROVIDER: Gayland Credit, NP  END OF SESSION:  End of Session - 01/27/24 1537     Visit Number 3    Number of Visits 17    Date for SLP Re-Evaluation 03/17/24    SLP Start Time 1534    SLP Stop Time  1615    SLP Time Calculation (min) 41 min    Activity Tolerance Patient tolerated treatment well           Past Medical History:  Diagnosis Date   Allergic rhinitis 08/27/2011   Basal cell carcinoma of skin    BPH (benign prostatic hyperplasia) 10/01/2011   Had urology work up; psa normal   Claustrophobia    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 Onita [facility]   MMSE 25/30 08/2020, neurology. On nemenda and aricept .  MMSE 19/30 04/2023, on nemenda and aricept      Erectile dysfunction 10/01/2011   GAD (generalized anxiety disorder) 08/06/2021   History of prostate cancer 11/22/2017   dxd 11/2017: active surveillance then rads tx 2020   History of traumatic head injury    Idiopathic hypotension 01/07/2016    Normal stress Echo - 12/2014 Neg adrenal insufficiency work up by endocrine 2017  Formatting of this note might be different from the original. Normal stress Echo - 12/2014   Mixed hyperlipidemia 09/27/2014   Night terrors    Osteoarthritis, knee 08/01/2010   Parkinson's disease, akinetic/rigid subtype 02/03/2022   REM behavioral disorder    Simple cyst of kidney 11/12/2011   Overview:  Stable on CT scan/ no further imaging or work up needed/cla  Formatting of this note might be different from the original. Stable on CT scan/ no further imaging or work up needed/cla   Tubular adenoma of colon 06/15/2017   Colonoscopy 08/2016; rec repeat in 5 years.    Past Surgical History:  Procedure Laterality Date   APPENDECTOMY      DENTAL SURGERY     Had a tooth pulled in 01/2017   KNEE ARTHROSCOPY WITH MENISCAL REPAIR Right    MOHS SURGERY     RHINOPLASTY     TONSILLECTOMY AND ADENOIDECTOMY     Patient Active Problem List   Diagnosis Date Noted   Dementia due to Parkinson's disease 01/04/2024   Parkinson's disease, akinetic/rigid subtype 02/03/2022   GAD (generalized anxiety disorder) 08/06/2021   REM behavioral disorder    History of traumatic head injury    Basal cell carcinoma of skin 06/15/2017   Tubular adenoma of colon 06/15/2017   Idiopathic hypotension 01/07/2016   Mixed hyperlipidemia 09/27/2014   Simple cyst of kidney 11/12/2011   BPH (benign prostatic hyperplasia) 10/01/2011   Erectile dysfunction 10/01/2011   Allergic rhinitis 08/27/2011   Osteoarthritis, knee 08/01/2010   Claustrophobia 07/08/2009    ONSET DATE: script 11/17/23  REFERRING DIAG: G31.84 (ICD-10-CM) - Mild neurocognitive disorder R41.89 (ICD-10-CM) - Cognitive deficits G20.C (ICD-10-CM) - Parkinsonism, unspecified Parkinsonism type (HCC)  THERAPY DIAG:  No diagnosis found.  Rationale for Evaluation and Treatment: Rehabilitation  SUBJECTIVE:   SUBJECTIVE STATEMENT: Pt reports having a not great day.  Pt accompanied by: significant other  PERTINENT HISTORY:  Dr. Richie 01/12/24: Feedback:   [name] completed a comprehensive neuropsychological evaluation on 01/04/2024. Please refer to that encounter for  the full report and recommendations. Briefly, results suggested significant impairment surrounding processing speed, complex attention, executive functioning, receptive language, semantic fluency, and encoding (i.e., learning) aspects of memory. Further performance variability was exhibited across visuospatial abilities and both delayed retrieval and recognition aspects of memory. Relative to his initial evaluation in July 2022, prominent decline has been exhibited across processing speed, executive functioning, receptive  language, and encoding (i.e., learning) aspects of memory. More mild decline could be argued across complex attention and confrontation naming. Regarding the cause for his dementia presentation, Dr. Evonnie previously theorized the akinetic/rigid subtype of Parkinson's disease. Current testing patterns are quite reasonable given this suspected etiology, as is evidence for progressive cognitive decline in the domains described above. His abnormal DaTscan  further aligns with a parkinsonian presentation. An underlying Parkinson's disease dementia presentation appears most likely.    [name] was accompanied by his wife during the current feedback session. Content of the current session focused on the results of his neuropsychological evaluation. [name] was given the opportunity to ask questions and his questions were answered. He was encouraged to reach out should additional questions arise. A copy of his report was provided at the conclusion of the visit.   PAIN:  Are you having pain? Yes: NPRS scale: 3-4/10 Pain location: lt calf Pain description: soreness, after treadmill this morning  FALLS: Has patient fallen in last 6 months?  No   PATIENT GOALS: improve memory and communication  OBJECTIVE:  Note: Objective measures were completed at Evaluation unless otherwise noted.    PATIENT REPORTED OUTCOME MEASURES (PROM): Cognitive Function: returned 01/24/24 and [name] answered with 46/140, and [name] responded with 48/140, with lower scores indicating pt's deficits having greater affect on daily tasks.                                                                                                                            TREATMENT DATE:   01/27/24: SLP engaged pt and wife in additional education regarding compensations for processing and attention. (See pt instructions) SLP then engaged pt in work requiring processing of written language in word problems and pt req'd consistent max A. SLP  encouraged pt to underline pertinent info and pt req'd consistent mod cues for this from SLP.  SLP provided an easier word problem sheet for pt and wife to work through at home for homework, and asked them to complete if desired the rest of today's sheet. Suggested taking breaks with frustration or fatigue.  01/24/24: SLP needs to ask pt/wife which pens were used to score PROM.  SLP consulted with pt and wife about situations in which pt demonstrated difficulty since last session. Pt has difficult with limited communication during dinner Saturday night at [facility]. SLP suggested going during a quieter time, and/or not at such a busy restaurant. SLP educated about pt or wife how to self-advocate to ask speakers to slow rate and allow Dene more time to process. Pt and wife stated  that he/she/they could talk with friend Hadassah, and [name]'s sister who is coming in September about these modifications. [name] continues to use a faster speech rate today during session, but slower than evaluation. Dene appeared to comprehend [name] when asking him direct questions today which was different than evaluation.   01/18/24: Discussed some compensations for pt and wife given results of neuropsych testing. Educated about situations that would make communication difficult, given report from pt and wife.   PATIENT EDUCATION: Education details: see treatment date today's date Person educated: Patient and Spouse Education method: Medical illustrator Education comprehension: verbalized understanding, returned demonstration, verbal cues required, and needs further education  HOME EXERCISE PROGRAM: Use compensations at home as suggested.   GOALS: Goals reviewed with patient? No  SHORT TERM GOALS: Target date: 02/18/24  Pt will tell SLP two compensatory strategies he could use to improve communication, in 3 sessions Baseline:  Goal status: INITIAL  2.  Pt will use one compensation given min A to do so,  in/between two sessions Baseline:  Goal status: INITIAL  3.  To enhance pt's ability to communicate, pt's wife will demo appropriate compensations in two sessions Baseline: 01/25/24 Goal status: INITIAL   LONG TERM GOALS: Target date: 03/17/24  Pt will improve PROM compared to initial measurement Baseline:  Goal status: INITIAL  2.  To enhance pt's ability to communicate, pt's wife will demo appropriate compensations in two sessions after 02/18/24 Baseline:  Goal status: INITIAL  3.  Pt will use two compensatory strategies to improve communication in/report use of have use reported, between 3 sessions Baseline:  Goal status: INITIAL   ASSESSMENT:  CLINICAL IMPRESSION: Patient is a 83 y.o. M who was seen today for treatment of cognitive linguistics/memory in light of neuropsych eval in July 2025 with attention, processing, executive function, and memory identified as deficit areas. SLP cont to provide education re: strategies to use in more difficult communication situations, in addition to encouraging pt to self-advocate with people he feels comfortable doing this with.   OBJECTIVE IMPAIRMENTS: Objective impairments include attention, memory, executive functioning, and receptive language. These impairments are limiting patient from managing medications, managing appointments, managing finances, household responsibilities, ADLs/IADLs, and effectively communicating at home and in community.Factors affecting potential to achieve goals and functional outcome are ability to learn/carryover information and severity of impairments.. Patient will benefit from skilled SLP services to address above impairments and improve overall function.  REHAB POTENTIAL: Good  PLAN:  SLP FREQUENCY: 1-2x/week  SLP DURATION: 8 weeks  PLANNED INTERVENTIONS: Environmental controls, Cueing hierachy, Cognitive reorganization, Internal/external aids, Functional tasks, SLP instruction and feedback,  Compensatory strategies, Patient/family education, and 07492 Treatment of speech (30 or 45 min)     Saiquan Hands, CCC-SLP 01/27/2024, 3:37 PM  Referring diagnosis? G31.84 (ICD-10-CM) - Mild neurocognitive disorder R41.89 (ICD-10-CM) - Cognitive deficits G20.C (ICD-10-CM) - Parkinsonism, unspecified Parkinsonism type (HCC) Treatment diagnosis? (if different than referring diagnosis) Cognitive-Communication Deficit   What was this (referring dx) caused by? []  Surgery []  Fall [x]  Ongoing issue []  Arthritis []  Other: ____________  Laterality: N/A  Check all possible CPT codes:  *CHOOSE 10 OR LESS*    See Planned Interventions listed in the Plan section of the Evaluation.

## 2024-01-31 ENCOUNTER — Encounter

## 2024-01-31 DIAGNOSIS — H0288B Meibomian gland dysfunction left eye, upper and lower eyelids: Secondary | ICD-10-CM | POA: Diagnosis not present

## 2024-01-31 DIAGNOSIS — H18513 Endothelial corneal dystrophy, bilateral: Secondary | ICD-10-CM | POA: Diagnosis not present

## 2024-01-31 DIAGNOSIS — H0288A Meibomian gland dysfunction right eye, upper and lower eyelids: Secondary | ICD-10-CM | POA: Diagnosis not present

## 2024-01-31 DIAGNOSIS — H40013 Open angle with borderline findings, low risk, bilateral: Secondary | ICD-10-CM | POA: Diagnosis not present

## 2024-01-31 DIAGNOSIS — Z961 Presence of intraocular lens: Secondary | ICD-10-CM | POA: Diagnosis not present

## 2024-02-01 ENCOUNTER — Ambulatory Visit

## 2024-02-01 DIAGNOSIS — R4189 Other symptoms and signs involving cognitive functions and awareness: Secondary | ICD-10-CM | POA: Diagnosis not present

## 2024-02-01 DIAGNOSIS — R41841 Cognitive communication deficit: Secondary | ICD-10-CM | POA: Diagnosis not present

## 2024-02-01 DIAGNOSIS — G3184 Mild cognitive impairment, so stated: Secondary | ICD-10-CM | POA: Diagnosis not present

## 2024-02-01 DIAGNOSIS — G20C Parkinsonism, unspecified: Secondary | ICD-10-CM | POA: Diagnosis not present

## 2024-02-01 NOTE — Therapy (Signed)
 OUTPATIENT SPEECH LANGUAGE PATHOLOGY TREATMENT   Patient Name: Caleb Gibson MRN: [id number] DOB:[birth year], 83 y.o., male Today's Date: 02/01/2024  PCP: Jodie Gammons, MD REFERRING PROVIDER: Gayland Credit, NP  END OF SESSION:  End of Session - 02/01/24 1155     Visit Number 4    Number of Visits 17    Date for SLP Re-Evaluation 03/17/24    SLP Start Time 1151    SLP Stop Time  1231    SLP Time Calculation (min) 40 min    Activity Tolerance Patient tolerated treatment well           Past Medical History:  Diagnosis Date   Allergic rhinitis 08/27/2011   Basal cell carcinoma of skin    BPH (benign prostatic hyperplasia) 10/01/2011   Had urology work up; psa normal   Claustrophobia    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 Onita [facility]   MMSE 25/30 08/2020, neurology. On nemenda and aricept .  MMSE 19/30 04/2023, on nemenda and aricept      Erectile dysfunction 10/01/2011   GAD (generalized anxiety disorder) 08/06/2021   History of prostate cancer 11/22/2017   dxd 11/2017: active surveillance then rads tx 2020   History of traumatic head injury    Idiopathic hypotension 01/07/2016    Normal stress Echo - 12/2014 Neg adrenal insufficiency work up by endocrine 2017  Formatting of this note might be different from the original. Normal stress Echo - 12/2014   Mixed hyperlipidemia 09/27/2014   Night terrors    Osteoarthritis, knee 08/01/2010   Parkinson's disease, akinetic/rigid subtype 02/03/2022   REM behavioral disorder    Simple cyst of kidney 11/12/2011   Overview:  Stable on CT scan/ no further imaging or work up needed/cla  Formatting of this note might be different from the original. Stable on CT scan/ no further imaging or work up needed/cla   Tubular adenoma of colon 06/15/2017   Colonoscopy 08/2016; rec repeat in 5 years.    Past Surgical History:  Procedure Laterality Date   APPENDECTOMY      DENTAL SURGERY     Had a tooth pulled in 01/2017   KNEE ARTHROSCOPY WITH MENISCAL REPAIR Right    MOHS SURGERY     RHINOPLASTY     TONSILLECTOMY AND ADENOIDECTOMY     Patient Active Problem List   Diagnosis Date Noted   Dementia due to Parkinson's disease 01/04/2024   Parkinson's disease, akinetic/rigid subtype 02/03/2022   GAD (generalized anxiety disorder) 08/06/2021   REM behavioral disorder    History of traumatic head injury    Basal cell carcinoma of skin 06/15/2017   Tubular adenoma of colon 06/15/2017   Idiopathic hypotension 01/07/2016   Mixed hyperlipidemia 09/27/2014   Simple cyst of kidney 11/12/2011   BPH (benign prostatic hyperplasia) 10/01/2011   Erectile dysfunction 10/01/2011   Allergic rhinitis 08/27/2011   Osteoarthritis, knee 08/01/2010   Claustrophobia 07/08/2009    ONSET DATE: script 11/17/23  REFERRING DIAG: G31.84 (ICD-10-CM) - Mild neurocognitive disorder R41.89 (ICD-10-CM) - Cognitive deficits G20.C (ICD-10-CM) - Parkinsonism, unspecified Parkinsonism type (HCC)  THERAPY DIAG:  Cognitive communication deficit  Rationale for Evaluation and Treatment: Rehabilitation  SUBJECTIVE:   SUBJECTIVE STATEMENT: Do I drop the shampoo in the eyes, then?  Pt accompanied by: significant other  PERTINENT HISTORY:  Dr. Richie 01/12/24: Feedback:   Caleb Gibson completed a comprehensive neuropsychological evaluation on 01/04/2024. Please refer to that  encounter for the full report and recommendations. Briefly, results suggested significant impairment surrounding processing speed, complex attention, executive functioning, receptive language, semantic fluency, and encoding (i.e., learning) aspects of memory. Further performance variability was exhibited across visuospatial abilities and both delayed retrieval and recognition aspects of memory. Relative to his initial evaluation in July 2022, prominent decline has been exhibited across processing speed, executive  functioning, receptive language, and encoding (i.e., learning) aspects of memory. More mild decline could be argued across complex attention and confrontation naming. Regarding the cause for his dementia presentation, Dr. Evonnie previously theorized the akinetic/rigid subtype of Parkinson's disease. Current testing patterns are quite reasonable given this suspected etiology, as is evidence for progressive cognitive decline in the domains described above. His abnormal DaTscan  further aligns with a parkinsonian presentation. An underlying Parkinson's disease dementia presentation appears most likely.    Caleb Gibson was accompanied by his wife during the current feedback session. Content of the current session focused on the results of his neuropsychological evaluation. Caleb Gibson was given the opportunity to ask questions and his questions were answered. He was encouraged to reach out should additional questions arise. A copy of his report was provided at the conclusion of the visit.   PAIN:  Are you having pain? Yes: NPRS scale: 3-4/10 Pain location: lt calf Pain description: soreness, after treadmill this morning  FALLS: Has patient fallen in last 6 months?  No   PATIENT GOALS: improve memory and communication  OBJECTIVE:  Note: Objective measures were completed at Evaluation unless otherwise noted.    PATIENT REPORTED OUTCOME MEASURES (PROM): Cognitive Function: returned 01/24/24 and Caleb Gibson answered with 46/140, and Caleb Gibson responded with 48/140, with lower scores indicating pt's deficits having greater affect on daily tasks.                                                                                                                            TREATMENT DATE:   02/01/24: Caleb Gibson stated pt had most diffiuclty with homework requiring the need to manupulate numbers >once.  Pt shared with SLP about his weekend - what he spoke to daughters about. SLP learned that pt has added eye care into his routine as  of today after a visit to the cornea specialist yesterday. This morning he forgot to do the eye care recommended. Given this, SLP assisted pt and wife tin developing a scaffolding at home in which pt can remember to do all eye care discussed at his appointment yesterday. SLP encouraged pt and wife to keep as much routine as possible when Caleb Gibson's sister arrives in two weeks, as routine is best for those pts with memory deficits. Pt agreed with this. Homework provided.  01/27/24: SLP engaged pt and wife in additional education regarding compensations for processing and attention. (See pt instructions) SLP then engaged pt in work requiring processing of written language in word problems and pt req'd consistent max A. SLP encouraged pt to underline pertinent info and pt  req'd consistent mod cues for this from SLP.  SLP provided an easier word problem sheet for pt and wife to work through at home for homework, and asked them to complete if desired the rest of today's sheet. Suggested taking breaks with frustration or fatigue.  01/24/24: SLP needs to ask pt/wife which pens were used to score PROM.  SLP consulted with pt and wife about situations in which pt demonstrated difficulty since last session. Pt has difficult with limited communication during dinner Saturday night at [facility]. SLP suggested going during a quieter time, and/or not at such a busy restaurant. SLP educated about pt or wife how to self-advocate to ask speakers to slow rate and allow Dene more time to process. Pt and wife stated that he/she/they could talk with friend Hadassah, and Caleb Gibson's sister who is coming in September about these modifications. Caleb Gibson continues to use a faster speech rate today during session, but slower than evaluation. Dene appeared to comprehend Caleb Gibson when asking him direct questions today which was different than evaluation.   01/18/24: Discussed some compensations for pt and wife given results of neuropsych testing.  Educated about situations that would make communication difficult, given report from pt and wife.   PATIENT EDUCATION: Education details: see treatment date today's date Person educated: Patient and Spouse Education method: Medical illustrator Education comprehension: verbalized understanding, returned demonstration, verbal cues required, and needs further education  HOME EXERCISE PROGRAM: Use compensations at home as suggested.   GOALS: Goals reviewed with patient? No  SHORT TERM GOALS: Target date: 02/18/24  Pt will tell SLP two compensatory strategies he could use to improve communication, in 3 sessions Baseline:  Goal status: INITIAL  2.  Pt will use one compensation given min A to do so, in/between two sessions Baseline:  Goal status: INITIAL  3.  To enhance pt's ability to communicate, pt's wife will demo appropriate compensations in two sessions Baseline: 01/25/24 Goal status: INITIAL   LONG TERM GOALS: Target date: 03/17/24  Pt will improve PROM compared to initial measurement Baseline:  Goal status: INITIAL  2.  To enhance pt's ability to communicate, pt's wife will demo appropriate compensations in two sessions after 02/18/24 Baseline:  Goal status: INITIAL  3.  Pt will use two compensatory strategies to improve communication in/report use of have use reported, between 3 sessions Baseline:  Goal status: INITIAL   ASSESSMENT:  CLINICAL IMPRESSION: Patient is a 83 y.o. M who was seen today for treatment of cognitive linguistics/memory in light of neuropsych eval in July 2025 with attention, processing, executive function, and memory identified as deficit areas. SLP cont to provide education re: strategies to use in more difficult communication situations, in addition to encouraging pt to self-advocate with people he feels comfortable doing this with.   OBJECTIVE IMPAIRMENTS: Objective impairments include attention, memory, executive functioning, and  receptive language. These impairments are limiting patient from managing medications, managing appointments, managing finances, household responsibilities, ADLs/IADLs, and effectively communicating at home and in community.Factors affecting potential to achieve goals and functional outcome are ability to learn/carryover information and severity of impairments.. Patient will benefit from skilled SLP services to address above impairments and improve overall function.  REHAB POTENTIAL: Good  PLAN:  SLP FREQUENCY: 1-2x/week  SLP DURATION: 8 weeks  PLANNED INTERVENTIONS: Environmental controls, Cueing hierachy, Cognitive reorganization, Internal/external aids, Functional tasks, SLP instruction and feedback, Compensatory strategies, Patient/family education, and 07492 Treatment of speech (30 or 45 min)     Dalayza Zambrana, CCC-SLP 02/01/2024, 11:57 AM  Referring diagnosis? G31.84 (ICD-10-CM) - Mild neurocognitive disorder R41.89 (ICD-10-CM) - Cognitive deficits G20.C (ICD-10-CM) - Parkinsonism, unspecified Parkinsonism type (HCC) Treatment diagnosis? (if different than referring diagnosis) Cognitive-Communication Deficit   What was this (referring dx) caused by? []  Surgery []  Fall [x]  Ongoing issue []  Arthritis []  Other: ____________  Laterality: N/A  Check all possible CPT codes:  *CHOOSE 10 OR LESS*    See Planned Interventions listed in the Plan section of the Evaluation.

## 2024-02-02 ENCOUNTER — Encounter

## 2024-02-03 ENCOUNTER — Ambulatory Visit

## 2024-02-03 DIAGNOSIS — R41841 Cognitive communication deficit: Secondary | ICD-10-CM

## 2024-02-03 DIAGNOSIS — G20C Parkinsonism, unspecified: Secondary | ICD-10-CM | POA: Diagnosis not present

## 2024-02-03 DIAGNOSIS — G3184 Mild cognitive impairment, so stated: Secondary | ICD-10-CM | POA: Diagnosis not present

## 2024-02-03 DIAGNOSIS — R4189 Other symptoms and signs involving cognitive functions and awareness: Secondary | ICD-10-CM | POA: Diagnosis not present

## 2024-02-03 NOTE — Therapy (Signed)
 OUTPATIENT SPEECH LANGUAGE PATHOLOGY TREATMENT   Patient Name: Caleb Gibson MRN: [id number] DOB:1940-10-27, 83 y.o., male Today's Date: 02/03/2024  PCP: Jodie Gammons, MD REFERRING PROVIDER: Gayland Credit, NP  END OF SESSION:  End of Session - 02/03/24 1245     Visit Number 5    Number of Visits 17    Date for SLP Re-Evaluation 03/17/24    SLP Start Time 1150    SLP Stop Time  1230    SLP Time Calculation (min) 40 min    Activity Tolerance Patient tolerated treatment well            Past Medical History:  Diagnosis Date   Allergic rhinitis 08/27/2011   Basal cell carcinoma of skin    BPH (benign prostatic hyperplasia) 10/01/2011   Had urology work up; psa normal   Claustrophobia    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 On[facility]   MMSE 25/30 08/2020, neurology. On nemenda and aricept .  MMSE 19/30 04/2023, on nemenda and aricept      Erectile dysfunction 10/01/2011   GAD (generalized anxiety disorder) 08/06/2021   History of prostate cancer 11/22/2017   dxd 11/2017: active surveillance then rads tx 2020   History of traumatic head injury    Idiopathic hypotension 01/07/2016    Normal stress Echo - 12/2014 Neg adrenal insufficiency work up by endocrine 2017  Formatting of this note might be different from the original. Normal stress Echo - 12/2014   Mixed hyperlipidemia 09/27/2014   Night terrors    Osteoarthritis, knee 08/01/2010   Parkinson's disease, akinetic/rigid subtype 02/03/2022   REM behavioral disorder    Simple cyst of kidney 11/12/2011   Overview:  Stable on CT scan/ no further imaging or work up needed/cla  Formatting of this note might be different from the original. Stable on CT scan/ no further imaging or work up needed/cla   Tubular adenoma of colon 06/15/2017   Colonoscopy 08/2016; rec repeat in 5 years.    Past Surgical History:  Procedure Laterality Date   APPENDECTOMY      DENTAL SURGERY     Had a tooth pulled in 01/2017   KNEE ARTHROSCOPY WITH MENISCAL REPAIR Right    MOHS SURGERY     RHINOPLASTY     TONSILLECTOMY AND ADENOIDECTOMY     Patient Active Problem List   Diagnosis Date Noted   Dementia due to Parkinson's disease 01/04/2024   Parkinson's disease, akinetic/rigid subtype 02/03/2022   GAD (generalized anxiety disorder) 08/06/2021   REM behavioral disorder    History of traumatic head injury    Basal cell carcinoma of skin 06/15/2017   Tubular adenoma of colon 06/15/2017   Idiopathic hypotension 01/07/2016   Mixed hyperlipidemia 09/27/2014   Simple cyst of kidney 11/12/2011   BPH (benign prostatic hyperplasia) 10/01/2011   Erectile dysfunction 10/01/2011   Allergic rhinitis 08/27/2011   Osteoarthritis, knee 08/01/2010   Claustrophobia 07/08/2009    ONSET DATE: script 11/17/23  REFERRING DIAG: G31.84 (ICD-10-CM) - Mild neurocognitive disorder R41.89 (ICD-10-CM) - Cognitive deficits G20.C (ICD-10-CM) - Parkinsonism, unspecified Parkinsonism type (HCC)  THERAPY DIAG:  Cognitive communication deficit  Rationale for Evaluation and Treatment: Rehabilitation  SUBJECTIVE:   SUBJECTIVE STATEMENT: It's August 30th.  (Pt, looking at calendar)  Pt accompanied by: significant other - [name]  PERTINENT HISTORY:  Dr. Richie 01/12/24: Feedback:   Mr. Efferson completed a comprehensive neuropsychological evaluation on 01/04/2024. Please refer  to that encounter for the full report and recommendations. Briefly, results suggested significant impairment surrounding processing speed, complex attention, executive functioning, receptive language, semantic fluency, and encoding (i.e., learning) aspects of memory. Further performance variability was exhibited across visuospatial abilities and both delayed retrieval and recognition aspects of memory. Relative to his initial evaluation in July 2022, prominent decline has been exhibited across processing speed,  executive functioning, receptive language, and encoding (i.e., learning) aspects of memory. More mild decline could be argued across complex attention and confrontation naming. Regarding the cause for his dementia presentation, Dr. Evonnie previously theorized the akinetic/rigid subtype of Parkinson's disease. Current testing patterns are quite reasonable given this suspected etiology, as is evidence for progressive cognitive decline in the domains described above. His abnormal DaTscan  further aligns with a parkinsonian presentation. An underlying Parkinson's disease dementia presentation appears most likely.    Mr. Sheerin was accompanied by his wife during the current feedback session. Content of the current session focused on the results of his neuropsychological evaluation. Mr. Rueter was given the opportunity to ask questions and his questions were answered. He was encouraged to reach out should additional questions arise. A copy of his report was provided at the conclusion of the visit.   PAIN:  Are you having pain? Yes: NPRS scale: 3-4/10 Pain location: lt calf Pain description: soreness, after treadmill this morning  FALLS: Has patient fallen in last 6 months?  No   PATIENT GOALS: improve memory and communication  OBJECTIVE:  Note: Objective measures were completed at Evaluation unless otherwise noted.    PATIENT REPORTED OUTCOME MEASURES (PROM): Cognitive Function: returned 01/24/24 and [name] answered with 46/140, and Urbano responded with 48/140, with lower scores indicating pt's deficits having greater affect on daily tasks.                                                                                                                            TREATMENT DATE:   02/03/24: Pt req'd mod-max cues for temporal orientation (date). Today SLP asked pt what was challenging for him at home and game pt 8-10 seconds to respond and [name] said, Do you want to get the shed cleaned out? Pt immediately  said yes. [name] alluded that pt had not done much work in the shed. SLP collaborated with pt and wife beginning and ending steps for pt and wife to work on a checklist at home to get shed cleaned out. After [name] telling pt she will have to get pt going to work on the shed, SLP educated [name] that they should look at calendar for next day and have Dene figure out/decide when they could work for 1-2 hours on the shed. SLP educated/reminded [name] that pt may need redirection back to task during their work in the shed. They will take a picture of or bring the checklist in next session.   02/01/24: [name] stated pt had most diffiuclty with homework requiring the need to manupulate numbers >once.  Pt shared with SLP about his weekend - what he spoke to daughters about. SLP learned that pt has added eye care into his routine as of today after a visit to the cornea specialist yesterday. This morning he forgot to do the eye care recommended. Given this, SLP assisted pt and wife tin developing a scaffolding at home in which pt can remember to do all eye care discussed at his appointment yesterday. SLP encouraged pt and wife to keep as much routine as possible when [name]'s sister arrives in two weeks, as routine is best for those pts with memory deficits. Pt agreed with this. Homework provided.  01/27/24: SLP engaged pt and wife in additional education regarding compensations for processing and attention. (See pt instructions) SLP then engaged pt in work requiring processing of written language in word problems and pt req'd consistent max A. SLP encouraged pt to underline pertinent info and pt req'd consistent mod cues for this from SLP.  SLP provided an easier word problem sheet for pt and wife to work through at home for homework, and asked them to complete if desired the rest of today's sheet. Suggested taking breaks with frustration or fatigue.  01/24/24: SLP needs to ask pt/wife which pens were used to score  PROM.  SLP consulted with pt and wife about situations in which pt demonstrated difficulty since last session. Pt has difficult with limited communication during dinner Saturday night at [facility]. SLP suggested going during a quieter time, and/or not at such a busy restaurant. SLP educated about pt or wife how to self-advocate to ask speakers to slow rate and allow Dene more time to process. Pt and wife stated that he/she/they could talk with friend Hadassah, and [name]'s sister who is coming in September about these modifications. [name] continues to use a faster speech rate today during session, but slower than evaluation. Dene appeared to comprehend [name] when asking him direct questions today which was different than evaluation.   01/18/24: Discussed some compensations for pt and wife given results of neuropsych testing. Educated about situations that would make communication difficult, given report from pt and wife.   PATIENT EDUCATION: Education details: see treatment date today's date Person educated: Patient and Spouse Education method: Medical illustrator Education comprehension: verbalized understanding, returned demonstration, verbal cues required, and needs further education  HOME EXERCISE PROGRAM: Use compensations at home as suggested.   GOALS: Goals reviewed with patient? No  SHORT TERM GOALS: Target date: 02/18/24  Pt will tell SLP two compensatory strategies he could use to improve communication, in 3 sessions Baseline:  Goal status: INITIAL  2.  Pt will use one compensation given min A to do so, in/between two sessions Baseline:  Goal status: INITIAL  3.  To enhance pt's ability to communicate, pt's wife will demo appropriate compensations in two sessions Baseline: 01/25/24 Goal status: INITIAL   LONG TERM GOALS: Target date: 03/17/24  Pt will improve PROM compared to initial measurement Baseline:  Goal status: INITIAL  2.  To enhance pt's ability to  communicate, pt's wife will demo appropriate compensations in two sessions after 02/18/24 Baseline:  Goal status: INITIAL  3.  Pt will use two compensatory strategies to improve communication in/report use of have use reported, between 3 sessions Baseline:  Goal status: INITIAL   ASSESSMENT:  CLINICAL IMPRESSION: Patient is a 83 y.o. M who was seen today for treatment of cognitive linguistics/memory in light of neuropsych eval in July 2025 with attention, processing, executive function, and  memory identified as deficit areas. See treatment date above for today's date for further details on today's session. SLP cont to provide education re: strategies to use in more difficult communication situations, in addition to encouraging pt to self-advocate with people he feels comfortable doing this with.   OBJECTIVE IMPAIRMENTS: Objective impairments include attention, memory, executive functioning, and receptive language. These impairments are limiting patient from managing medications, managing appointments, managing finances, household responsibilities, ADLs/IADLs, and effectively communicating at home and in community.Factors affecting potential to achieve goals and functional outcome are ability to learn/carryover information and severity of impairments.. Patient will benefit from skilled SLP services to address above impairments and improve overall function.  REHAB POTENTIAL: Good  PLAN:  SLP FREQUENCY: 1-2x/week  SLP DURATION: 8 weeks  PLANNED INTERVENTIONS: Environmental controls, Cueing hierachy, Cognitive reorganization, Internal/external aids, Functional tasks, SLP instruction and feedback, Compensatory strategies, Patient/family education, and [id number] Treatment of speech (30 or 45 min)     Haylin Camilli, CCC-SLP 02/03/2024, 12:45 PM  Referring diagnosis? G31.84 (ICD-10-CM) - Mild neurocognitive disorder R41.89 (ICD-10-CM) - Cognitive deficits G20.C (ICD-10-CM) - Parkinsonism,  unspecified Parkinsonism type (HCC) Treatment diagnosis? (if different than referring diagnosis) Cognitive-Communication Deficit   What was this (referring dx) caused by? []  Surgery []  Fall [x]  Ongoing issue []  Arthritis []  Other: ____________  Laterality: N/A  Check all possible CPT codes:  *CHOOSE 10 OR LESS*    See Planned Interventions listed in the Plan section of the Evaluation.

## 2024-02-08 ENCOUNTER — Ambulatory Visit: Attending: Neurology

## 2024-02-08 DIAGNOSIS — R41841 Cognitive communication deficit: Secondary | ICD-10-CM | POA: Diagnosis not present

## 2024-02-08 NOTE — Therapy (Signed)
 OUTPATIENT SPEECH LANGUAGE PATHOLOGY TREATMENT   Patient Name: Caleb Gibson MRN: [id number] DOB:Oct 08, 1940, 83 y.o., male Today's Date: 02/08/2024  PCP: Jodie Gammons, MD REFERRING PROVIDER: Gayland Credit, NP  END OF SESSION:  End of Session - 02/08/24 1250     Visit Number 6    Number of Visits 17    Date for SLP Re-Evaluation 03/17/24    SLP Start Time 1150    SLP Stop Time  1230    SLP Time Calculation (min) 40 min    Activity Tolerance Patient tolerated treatment well            Past Medical History:  Diagnosis Date   Allergic rhinitis 08/27/2011   Basal cell carcinoma of skin    BPH (benign prostatic hyperplasia) 10/01/2011   Had urology work up; psa normal   Claustrophobia    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 Onita [facility]   MMSE 25/30 08/2020, neurology. On nemenda and aricept .  MMSE 19/30 04/2023, on nemenda and aricept      Erectile dysfunction 10/01/2011   GAD (generalized anxiety disorder) 08/06/2021   History of prostate cancer 11/22/2017   dxd 11/2017: active surveillance then rads tx 2020   History of traumatic head injury    Idiopathic hypotension 01/07/2016    Normal stress Echo - 12/2014 Neg adrenal insufficiency work up by endocrine 2017  Formatting of this note might be different from the original. Normal stress Echo - 12/2014   Mixed hyperlipidemia 09/27/2014   Night terrors    Osteoarthritis, knee 08/01/2010   Parkinson's disease, akinetic/rigid subtype 02/03/2022   REM behavioral disorder    Simple cyst of kidney 11/12/2011   Overview:  Stable on CT scan/ no further imaging or work up needed/cla  Formatting of this note might be different from the original. Stable on CT scan/ no further imaging or work up needed/cla   Tubular adenoma of colon 06/15/2017   Colonoscopy 08/2016; rec repeat in 5 years.    Past Surgical History:  Procedure Laterality Date   APPENDECTOMY      DENTAL SURGERY     Had a tooth pulled in 01/2017   KNEE ARTHROSCOPY WITH MENISCAL REPAIR Right    MOHS SURGERY     RHINOPLASTY     TONSILLECTOMY AND ADENOIDECTOMY     Patient Active Problem List   Diagnosis Date Noted   Dementia due to Parkinson's disease 01/04/2024   Parkinson's disease, akinetic/rigid subtype 02/03/2022   GAD (generalized anxiety disorder) 08/06/2021   REM behavioral disorder    History of traumatic head injury    Basal cell carcinoma of skin 06/15/2017   Tubular adenoma of colon 06/15/2017   Idiopathic hypotension 01/07/2016   Mixed hyperlipidemia 09/27/2014   Simple cyst of kidney 11/12/2011   BPH (benign prostatic hyperplasia) 10/01/2011   Erectile dysfunction 10/01/2011   Allergic rhinitis 08/27/2011   Osteoarthritis, knee 08/01/2010   Claustrophobia 07/08/2009    ONSET DATE: script 11/17/23  REFERRING DIAG: G31.84 (ICD-10-CM) - Mild neurocognitive disorder R41.89 (ICD-10-CM) - Cognitive deficits G20.C (ICD-10-CM) - Parkinsonism, unspecified Parkinsonism type (HCC)  THERAPY DIAG:  Cognitive communication deficit  Rationale for Evaluation and Treatment: Rehabilitation  SUBJECTIVE:   SUBJECTIVE STATEMENT: It's August 30th.  (Pt, looking at calendar)  Pt accompanied by: significant other - [name]  PERTINENT HISTORY:  Dr. Richie 01/12/24: Feedback:   [name] completed a comprehensive neuropsychological evaluation on 01/04/2024. Please refer  to that encounter for the full report and recommendations. Briefly, results suggested significant impairment surrounding processing speed, complex attention, executive functioning, receptive language, semantic fluency, and encoding (i.e., learning) aspects of memory. Further performance variability was exhibited across visuospatial abilities and both delayed retrieval and recognition aspects of memory. Relative to his initial evaluation in July 2022, prominent decline has been exhibited across processing speed,  executive functioning, receptive language, and encoding (i.e., learning) aspects of memory. More mild decline could be argued across complex attention and confrontation naming. Regarding the cause for his dementia presentation, Dr. Evonnie previously theorized the akinetic/rigid subtype of Parkinson's disease. Current testing patterns are quite reasonable given this suspected etiology, as is evidence for progressive cognitive decline in the domains described above. His abnormal DaTscan  further aligns with a parkinsonian presentation. An underlying Parkinson's disease dementia presentation appears most likely.    [name] was accompanied by his wife during the current feedback session. Content of the current session focused on the results of his neuropsychological evaluation. [name] was given the opportunity to ask questions and his questions were answered. He was encouraged to reach out should additional questions arise. A copy of his report was provided at the conclusion of the visit.   PAIN:  Are you having pain? Yes: NPRS scale: 3-4/10 Pain location: lt calf Pain description: soreness, after treadmill this morning  FALLS: Has patient fallen in last 6 months?  No   PATIENT GOALS: improve memory and communication  OBJECTIVE:  Note: Objective measures were completed at Evaluation unless otherwise noted.    PATIENT REPORTED OUTCOME MEASURES (PROM): Cognitive Function: returned 01/24/24 and [name] answered with 46/140, and [name] responded with 48/140, with lower scores indicating pt's deficits having greater affect on daily tasks.                                                                                                                            TREATMENT DATE:   02/08/24:Pt and wife cleaned deck yesterday and used written steps for pt to process the task easier. 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 Wife to type up pt's steps and pt will  complete one/day.Today SLP observed [name] cue pt appropriately for pt figuring steps in this process.   02/03/24: Pt req'd mod-max cues for temporal orientation (date). Today SLP asked pt what was challenging for him at home and game pt 8-10 seconds to respond and [name] said, Do you want to get the shed cleaned out? Pt immediately said yes. [name] alluded that pt had not done much work in the shed. SLP collaborated with pt and wife beginning and ending steps for pt and wife to work on a checklist at home to get shed cleaned out. After [name] telling pt she will have to get pt going to work on the shed, SLP educated [name] that they should look at calendar for next day and have  Dene figure out/decide when they could work for 1-2 hours on the shed. SLP educated/reminded [name] that pt may need redirection back to task during their work in the shed. They will take a picture of or bring the checklist in next session.   02/01/24: [name] stated pt had most diffiuclty with homework requiring the need to manupulate numbers >once.  Pt shared with SLP about his weekend - what he spoke to daughters about. SLP learned that pt has added eye care into his routine as of today after a visit to the cornea specialist yesterday. This morning he forgot to do the eye care recommended. Given this, SLP assisted pt and wife tin developing a scaffolding at home in which pt can remember to do all eye care discussed at his appointment yesterday. SLP encouraged pt and wife to keep as much routine as possible when [name]'s sister arrives in two weeks, as routine is best for those pts with memory deficits. Pt agreed with this. Homework provided.  01/27/24: SLP engaged pt and wife in additional education regarding compensations for processing and attention. (See pt instructions) SLP then engaged pt in work requiring processing of written language in word problems and pt req'd consistent max A. SLP encouraged pt to underline pertinent info  and pt req'd consistent mod cues for this from SLP.  SLP provided an easier word problem sheet for pt and wife to work through at home for homework, and asked them to complete if desired the rest of today's sheet. Suggested taking breaks with frustration or fatigue.  01/24/24: SLP needs to ask pt/wife which pens were used to score PROM.  SLP consulted with pt and wife about situations in which pt demonstrated difficulty since last session. Pt has difficult with limited communication during dinner Saturday night at [facility]. SLP suggested going during a quieter time, and/or not at such a busy restaurant. SLP educated about pt or wife how to self-advocate to ask speakers to slow rate and allow Dene more time to process. Pt and wife stated that he/she/they could talk with friend Hadassah, and [name]'s sister who is coming in September about these modifications. [name] continues to use a faster speech rate today during session, but slower than evaluation. Dene appeared to comprehend [name] when asking him direct questions today which was different than evaluation.   01/18/24: Discussed some compensations for pt and wife given results of neuropsych testing. Educated about situations that would make communication difficult, given report from pt and wife.   PATIENT EDUCATION: Education details: see treatment date today's date Person educated: Patient and Spouse Education method: Medical illustrator Education comprehension: verbalized understanding, returned demonstration, verbal cues required, and needs further education  HOME EXERCISE PROGRAM: Use compensations at home as suggested.   GOALS: Goals reviewed with patient? No  SHORT TERM GOALS: Target date: 02/18/24  Pt will tell SLP two compensatory strategies he could use to improve communication, in 3 sessions Baseline:  Goal status: INITIAL  2.  Pt will use one compensation given min A to do so, in/between two sessions Baseline:  02/08/24 Goal status: INITIAL  3.  To enhance pt's ability to communicate, pt's wife will demo appropriate compensations in two sessions Baseline: 01/25/24 Goal status: met   LONG TERM GOALS: Target date: 03/17/24  Pt will improve PROM compared to initial measurement Baseline:  Goal status: INITIAL  2.  To enhance pt's ability to communicate, pt's wife will demo appropriate compensations in two sessions after 02/18/24 Baseline:  Goal status:  INITIAL  3.  Pt will use two compensatory strategies to improve communication in/report use of have use reported, between 3 sessions Baseline:  Goal status: INITIAL   ASSESSMENT:  CLINICAL IMPRESSION: Patient is a 83 y.o. M who was seen today for treatment of cognitive linguistics/memory in light of neuropsych eval in July 2025 with attention, processing, executive function, and memory identified as deficit areas. See treatment date above for today's date for further details on today's session. SLP cont to provide education re: strategies to use in more difficult communication situations, in addition to encouraging pt to self-advocate with people he feels comfortable doing this with.   OBJECTIVE IMPAIRMENTS: Objective impairments include attention, memory, executive functioning, and receptive language. These impairments are limiting patient from managing medications, managing appointments, managing finances, household responsibilities, ADLs/IADLs, and effectively communicating at home and in community.Factors affecting potential to achieve goals and functional outcome are ability to learn/carryover information and severity of impairments.. Patient will benefit from skilled SLP services to address above impairments and improve overall function.  REHAB POTENTIAL: Good  PLAN:  SLP FREQUENCY: 1-2x/week  SLP DURATION: 8 weeks  PLANNED INTERVENTIONS: Environmental controls, Cueing hierachy, Cognitive reorganization, Internal/external aids,  Functional tasks, SLP instruction and feedback, Compensatory strategies, Patient/family education, and [id number] Treatment of speech (30 or 45 min)     Jaielle Dlouhy, CCC-SLP 02/08/2024, 12:50 PM  Referring diagnosis? G31.84 (ICD-10-CM) - Mild neurocognitive disorder R41.89 (ICD-10-CM) - Cognitive deficits G20.C (ICD-10-CM) - Parkinsonism, unspecified Parkinsonism type (HCC) Treatment diagnosis? (if different than referring diagnosis) Cognitive-Communication Deficit   What was this (referring dx) caused by? []  Surgery []  Fall [x]  Ongoing issue []  Arthritis []  Other: ____________  Laterality: N/A  Check all possible CPT codes:  *CHOOSE 10 OR LESS*    See Planned Interventions listed in the Plan section of the Evaluation.

## 2024-02-09 ENCOUNTER — Ambulatory Visit: Admitting: Physical Therapy

## 2024-02-09 ENCOUNTER — Encounter: Payer: Self-pay | Admitting: Physical Therapy

## 2024-02-09 DIAGNOSIS — M6281 Muscle weakness (generalized): Secondary | ICD-10-CM | POA: Diagnosis not present

## 2024-02-09 DIAGNOSIS — M25552 Pain in left hip: Secondary | ICD-10-CM

## 2024-02-09 DIAGNOSIS — R2689 Other abnormalities of gait and mobility: Secondary | ICD-10-CM | POA: Diagnosis not present

## 2024-02-09 NOTE — Therapy (Unsigned)
 OUTPATIENT PHYSICAL THERAPY LOWER EXTREMITY TREATMENT   Patient Name: Teion Ballin MRN: [id number] DOB:December 01, 1940, 83 y.o., male Today's Date: 02/09/2024  END OF SESSION:  PT End of Session - 02/09/24 1305     Visit Number 2    Number of Visits 16    Date for PT Re-Evaluation 03/06/24    Authorization Type [employer]    Progress Note Due on Visit 10    PT Start Time 1305    PT Stop Time 1345    PT Time Calculation (min) 40 min    Equipment Utilized During Treatment Gait belt    Activity Tolerance Patient tolerated treatment well    Behavior During Therapy WFL for tasks assessed/performed          Past Medical History:  Diagnosis Date   Allergic rhinitis 08/27/2011   Basal cell carcinoma of skin    BPH (benign prostatic hyperplasia) 10/01/2011   Had urology work up; psa normal   Claustrophobia    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 Onita [facility]   MMSE 25/30 08/2020, neurology. On nemenda and aricept .  MMSE 19/30 04/2023, on nemenda and aricept      Erectile dysfunction 10/01/2011   GAD (generalized anxiety disorder) 08/06/2021   History of prostate cancer 11/22/2017   dxd 11/2017: active surveillance then rads tx 2020   History of traumatic head injury    Idiopathic hypotension 01/07/2016    Normal stress Echo - 12/2014 Neg adrenal insufficiency work up by endocrine 2017  Formatting of this note might be different from the original. Normal stress Echo - 12/2014   Mixed hyperlipidemia 09/27/2014   Night terrors    Osteoarthritis, knee 08/01/2010   Parkinson's disease, akinetic/rigid subtype 02/03/2022   REM behavioral disorder    Simple cyst of kidney 11/12/2011   Overview:  Stable on CT scan/ no further imaging or work up needed/cla  Formatting of this note might be different from the original. Stable on CT scan/ no further imaging or work up needed/cla   Tubular adenoma of colon  06/15/2017   Colonoscopy 08/2016; rec repeat in 5 years.    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   Parkinson's disease, akinetic/rigid subtype 02/03/2022   GAD (generalized anxiety disorder) 08/06/2021   REM behavioral disorder    History of traumatic head injury    Basal cell carcinoma of skin 06/15/2017   Tubular adenoma of colon 06/15/2017   Idiopathic hypotension 01/07/2016   Mixed hyperlipidemia 09/27/2014   Simple cyst of kidney 11/12/2011   BPH (benign prostatic hyperplasia) 10/01/2011   Erectile dysfunction 10/01/2011   Allergic rhinitis 08/27/2011   Osteoarthritis, knee 08/01/2010   Claustrophobia 07/08/2009    PCP: Jodie Lavern CROME, MD  REFERRING PROVIDER: Jodie Lavern CROME, MD  REFERRING DIAG: M54.16 (ICD-10-CM) - Left lumbar radiculopathy M25.552 (ICD-10-CM) - Left hip pain  THERAPY DIAG:  Pain in left hip  Muscle weakness (generalized)  Other abnormalities of gait and mobility  Rationale for Evaluation and Treatment: Rehabilitation  ONSET DATE: ~6 months  SUBJECTIVE:   SUBJECTIVE STATEMENT: Pt with no new complaints. Accompanied by wife. States glute pain improving.   Eval: Pt reports he has Parkinson's and because of this he has had greater difficulty with conversations and exercise. Wife is present to help with subjective. Pt reports L>R posterior hip pain. Wife notes times that pt has limped. If pt sits for a while, he has difficulty getting out of the chair. Pt does use the treadmill and tries to walk daily. Does have [facility] [employer]. Did get trained on using the machine weights there but hasn't used it. Pt did get anti-inflammatory initially and it did help  his hip pain as well as exercises from Dr. Jodie but didn't keep up with it.   PERTINENT HISTORY: Parkinson's diagnosis for ~3 years  PAIN:  Are you having pain? Yes: NPRS scale: 0 currently,  Pain location: Post hip L>R (points towards glute med) Pain description: aching; can get grabbing/shooting pain sometimes Aggravating factors: Getting out of a chair, walking close to a mile, tender to the touch Relieving factors: Occasionally aleve  PRECAUTIONS: None  RED FLAGS: None   WEIGHT BEARING RESTRICTIONS: No  FALLS:  Has patient fallen in last 6 months? No  LIVING ENVIRONMENT: Lives with: lives with their spouse Lives in: House/apartment Stairs: 16 stairs to go upstairs Has following equipment at home: Single point cane  OCCUPATION: Retired  PLOF: Independent  PATIENT GOALS: Improve hip pain  NEXT MD VISIT: as needed  OBJECTIVE:  Note: Objective measures were completed at Evaluation unless otherwise noted.  DIAGNOSTIC FINDINGS: Hip and Lumbar X-rays from 12/20/23 IMPRESSION: Mild degenerative changes of the bilateral hips.  IMPRESSION: Mild degenerative changes of the lumbar spine.  PATIENT SURVEYS:  Lower Extremity Functional Score: 48 / 80 = 60.0 %  COGNITION: Overall cognitive status: Within functional limits for tasks assessed     SENSATION: WFL  EDEMA:    MUSCLE LENGTH: Hamstrings: Right 70 deg; Left 70 deg Thomas test: Flat on bed bilat  POSTURE: rounded shoulders, forward head, and increased thoracic kyphosis  PALPATION: TTP L>R glute med, piriformis  LUMBAR ROM:   Active  A/PROM  eval  Flexion 75%  Extension 50%  Right lateral flexion 75%  Left lateral flexion 75%  Right rotation 100%  Left rotation 100%   (Blank rows = not tested)   LOWER EXTREMITY ROM: WNL  LOWER EXTREMITY MMT:  MMT Right eval Left eval  Hip flexion 4 4  Hip extension 3 3  Hip abduction 4- 3+  Hip adduction    Hip internal rotation 4- 4-  Hip external  rotation 4- 3+  Knee flexion 4 4  Knee extension 5 5  Ankle dorsiflexion    Ankle plantarflexion    Ankle inversion    Ankle eversion     (Blank rows = not tested)  LOWER EXTREMITY SPECIAL TESTS:  Hip special tests: Belvie (FABER) test: negative, Thomas test: negative, and Hip scouring test: negative  FUNCTIONAL TESTS:  5 times sit to stand: 12.24 sec slightly dizzy  GAIT: Distance walked: Into clinic Assistive device utilized: None Level of assistance: Complete Independence Comments: Normal reciprocal pattern  TREATMENT DATE:   02/09/24:  Therapeutic Exercise: Aerobic: Supine:  Clams Blue TB x 15;   glute sets - review for HEP Seated:  sit to stand 2 x 5;  Standing:  Stretches:  SKTC 30 sec x 3 bil;  Piriformis x 3 bil;  LTR x 15;   hip ER rom x 10;   Neuromuscular Re-education: Manual Therapy: Therapeutic Activity: Self Care:   PATIENT EDUCATION:  Education details: updated and reviewed HEP Person educated: Patient Education method: Explanation, Demonstration, and Handouts Education comprehension: verbalized understanding, returned demonstration, and needs further education  HOME EXERCISE PROGRAM: Access Code: [email or web address]   ASSESSMENT:  CLINICAL IMPRESSION: Pt with good tolerance for exercise today. Focus on education on HEP for lumbar mobility, and hip strength. Updated HEP to include lumbar mobility. Pt with minimal pain during session today. Plan to continue  strength and progress to standing strength as tolerated.   Eval: Patient is an 83 y.o. M who was seen today for physical therapy evaluation and treatment for L>R hip and low back pain. PMH is significant for Parkinson's Disease and hypotension (pt reports history of systolic BP in the 80s some days). Assessment is significant for L>R glute med weakness, gross bilat glute max weakness,  and some general lumbar tightness/hypomobility affecting his ability to tolerate prolonged standing, walking, transfers, and lifting tasks. Pt will benefit from PT to maximize his level of function.   OBJECTIVE IMPAIRMENTS: decreased activity tolerance, decreased endurance, decreased mobility, difficulty walking, decreased strength, increased fascial restrictions, increased muscle spasms, impaired flexibility, improper body mechanics, postural dysfunction, and pain.   ACTIVITY LIMITATIONS: carrying, lifting, standing, squatting, stairs, transfers, and locomotion level  PARTICIPATION LIMITATIONS: meal prep, cleaning, driving, shopping, community activity, and yard work  PERSONAL FACTORS: Age, Fitness, Past/current experiences, and Time since onset of injury/illness/exacerbation are also affecting patient's functional outcome.   REHAB POTENTIAL: Good  CLINICAL DECISION MAKING: Evolving/moderate complexity  EVALUATION COMPLEXITY: Moderate   GOALS: Goals reviewed with patient? Yes  SHORT TERM GOALS: Target date: 02/07/2024  Pt will be ind with initial HEP Baseline: Goal status: INITIAL  2.  Pt will demo improved lumbar ROM by at least 10% Baseline:  Goal status: INITIAL  3.  Pt will report improved pain by >/=25% Baseline:  Goal status: INITIAL   LONG TERM GOALS: Target date: 03/06/2024   Pt will be ind with management and progression of HEP Baseline:  Goal status: INITIAL  2.  Pt will have improved LEFS score to >/=70% to demo MCID Baseline:  Goal status: INITIAL  3.  Pt will report improved pain by >/=50% Baseline:  Goal status: INITIAL  4.  Pt will demo at least 4/5 bilat LE strength for improved standing tolerance Baseline:  Goal status: INITIAL    PLAN:  PT FREQUENCY: 2x/week  PT DURATION: 8 weeks  PLANNED INTERVENTIONS: 97164- PT Re-evaluation, 97110-Therapeutic exercises, 97530- Therapeutic activity, 97112- Neuromuscular re-education, 97535- Self Care,  97140- Manual therapy, [phone]- Gait training, [phone]- Electrical stimulation (unattended), [phone]- Ionotophoresis 4mg /ml Dexamethasone, 79439 (1-2 muscles), 20561 (3+ muscles)- Dry Needling, Patient/Family education, Balance training, Stair training, Taping, Joint mobilization, Spinal mobilization, Cryotherapy, and Moist heat  PLAN FOR NEXT SESSION:  Strengthen glute med/hips. Work on standing stability.    Tinnie Don, PT, DPT 02/09/2024, 1:05 PM

## 2024-02-11 ENCOUNTER — Encounter

## 2024-02-14 ENCOUNTER — Encounter: Payer: Self-pay | Admitting: Physical Therapy

## 2024-02-14 ENCOUNTER — Ambulatory Visit: Admitting: Physical Therapy

## 2024-02-14 DIAGNOSIS — M6281 Muscle weakness (generalized): Secondary | ICD-10-CM

## 2024-02-14 DIAGNOSIS — R2689 Other abnormalities of gait and mobility: Secondary | ICD-10-CM

## 2024-02-14 DIAGNOSIS — M25552 Pain in left hip: Secondary | ICD-10-CM

## 2024-02-14 NOTE — Therapy (Signed)
 OUTPATIENT PHYSICAL THERAPY LOWER EXTREMITY TREATMENT   Patient Name: Caleb Gibson MRN: [id number] DOB:January 19, 1941, 83 y.o., male Today's Date: 02/14/2024  END OF SESSION:  PT End of Session - 02/14/24 1102     Visit Number 3    Number of Visits 16    Date for PT Re-Evaluation 03/06/24    Authorization Type [employer]  8/4 to 11/12 (12 visits)    Progress Note Due on Visit 10    PT Start Time 1020    PT Stop Time 1058    PT Time Calculation (min) 38 min    Equipment Utilized During Treatment Gait belt    Activity Tolerance Patient tolerated treatment well    Behavior During Therapy WFL for tasks assessed/performed           Past Medical History:  Diagnosis Date   Allergic rhinitis 08/27/2011   Basal cell carcinoma of skin    BPH (benign prostatic hyperplasia) 10/01/2011   Had urology work up; psa normal   Claustrophobia    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 On[facility]   MMSE 25/30 08/2020, neurology. On nemenda and aricept .  MMSE 19/30 04/2023, on nemenda and aricept      Erectile dysfunction 10/01/2011   GAD (generalized anxiety disorder) 08/06/2021   History of prostate cancer 11/22/2017   dxd 11/2017: active surveillance then rads tx 2020   History of traumatic head injury    Idiopathic hypotension 01/07/2016    Normal stress Echo - 12/2014 Neg adrenal insufficiency work up by endocrine 2017  Formatting of this note might be different from the original. Normal stress Echo - 12/2014   Mixed hyperlipidemia 09/27/2014   Night terrors    Osteoarthritis, knee 08/01/2010   Parkinson's disease, akinetic/rigid subtype 02/03/2022   REM behavioral disorder    Simple cyst of kidney 11/12/2011   Overview:  Stable on CT scan/ no further imaging or work up needed/cla  Formatting of this note might be different from the original. Stable on CT scan/ no further imaging or work up needed/cla    Tubular adenoma of colon 06/15/2017   Colonoscopy 08/2016; rec repeat in 5 years.    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   Parkinson's disease, akinetic/rigid subtype 02/03/2022   GAD (generalized anxiety disorder) 08/06/2021   REM behavioral disorder    History of traumatic head injury    Basal cell carcinoma of skin 06/15/2017   Tubular adenoma of colon 06/15/2017   Idiopathic hypotension 01/07/2016   Mixed hyperlipidemia 09/27/2014   Simple cyst of kidney 11/12/2011   BPH (benign prostatic hyperplasia) 10/01/2011   Erectile dysfunction 10/01/2011   Allergic rhinitis 08/27/2011   Osteoarthritis, knee 08/01/2010   Claustrophobia 07/08/2009    PCP: Jodie Lavern CROME, MD  REFERRING PROVIDER: Jodie Lavern CROME, MD  REFERRING DIAG: M54.16 (ICD-10-CM) - Left lumbar radiculopathy M25.552 (ICD-10-CM) - Left hip pain  THERAPY DIAG:  Pain in left hip  Muscle weakness (generalized)  Other abnormalities of gait and mobility  Rationale for Evaluation and Treatment:  Rehabilitation  ONSET DATE: ~6 months  SUBJECTIVE:   SUBJECTIVE STATEMENT: Pt states improving pain in glutes. Has been doing HEP.  BP: 115/71 during supine ther ex.  Pt states BP usually low, not feeling symptomatic this am. But does note mild headache on L side during 1st seated exercise- hamstring stretch. No other symptoms throughout session.    Eval: Pt reports he has Parkinson's and because of this he has had greater difficulty with conversations and exercise. Wife is present to help with subjective. Pt reports L>R posterior hip pain. Wife notes times that pt has limped. If pt sits for a while, he has difficulty  getting out of the chair. Pt does use the treadmill and tries to walk daily. Does have [facility] [employer] [facility]. Did get trained on using the machine weights there but hasn't used it. Pt did get anti-inflammatory initially and it did help his hip pain as well as exercises from Dr. Jodie but didn't keep up with it.   PERTINENT HISTORY: Parkinson's diagnosis for ~3 years  PAIN:  Are you having pain? Yes: NPRS scale: 0 currently,  Pain location: Post hip L>R (points towards glute med) Pain description: aching; can get grabbing/shooting pain sometimes Aggravating factors: Getting out of a chair, walking close to a mile, tender to the touch Relieving factors: Occasionally aleve  PRECAUTIONS: None  RED FLAGS: None   WEIGHT BEARING RESTRICTIONS: No  FALLS:  Has patient fallen in last 6 months? No  LIVING ENVIRONMENT: Lives with: lives with their spouse Lives in: House/apartment Stairs: 16 stairs to go upstairs Has following equipment at home: Single point cane  OCCUPATION: Retired  PLOF: Independent  PATIENT GOALS: Improve hip pain  NEXT MD VISIT: as needed  OBJECTIVE:  Note: Objective measures were completed at Evaluation unless otherwise noted.  DIAGNOSTIC FINDINGS: Hip and Lumbar X-rays from 12/20/23 IMPRESSION: Mild degenerative changes of the bilateral hips.  IMPRESSION: Mild degenerative changes of the lumbar spine.  PATIENT SURVEYS:  Lower Extremity Functional Score: 48 / 80 = 60.0 %  COGNITION: Overall cognitive status: Within functional limits for tasks assessed     SENSATION: WFL  EDEMA:    MUSCLE LENGTH: Hamstrings: Right 70 deg; Left 70 deg Thomas test: Flat on bed bilat  POSTURE: rounded shoulders, forward head, and increased thoracic kyphosis  PALPATION: TTP L>R glute med, piriformis  LUMBAR ROM:   Active  A/PROM  eval  Flexion 75%  Extension 50%  Right lateral flexion 75%  Left lateral flexion 75%  Right rotation 100%  Left rotation 100%    (Blank rows = not tested)   LOWER EXTREMITY ROM: WNL   LOWER EXTREMITY MMT:  MMT Right eval Left eval  Hip flexion 4 4  Hip extension 3 3  Hip abduction 4- 3+  Hip adduction    Hip internal rotation 4- 4-  Hip external rotation 4- 3+  Knee flexion 4 4  Knee extension 5 5  Ankle dorsiflexion    Ankle plantarflexion    Ankle inversion    Ankle eversion     (Blank rows = not tested)  LOWER EXTREMITY SPECIAL TESTS:  Hip special tests: Belvie (FABER) test: negative, Thomas test: negative, and Hip scouring test: negative  FUNCTIONAL TESTS:  5 times sit to stand: 12.24 sec slightly dizzy  GAIT: Distance walked: Into clinic Assistive device utilized: None Level of assistance: Complete Independence Comments: Normal reciprocal pattern  TREATMENT DATE:   02/14/24 Therapeutic Exercise: Aerobic: Supine:  Clams Blue TB x 20 ;    Seated:  sit to stand 2 x  5;    Standing:  hip abd 2 x 10 , 2 x 10 bil;  Stretches:  SKTC 30 sec x 3 bil;  Piriformis x 3 bil;    LTR x 15;   hip ER rom x 10;   Neuromuscular Re-education: Manual Therapy: Therapeutic Activity:   Self Care:   02/09/24:  Therapeutic Exercise: Aerobic: Supine:  Clams Blue TB x 15;   glute sets - review for HEP Seated:  sit to stand 2 x 5;  Standing:  Stretches:  SKTC 30 sec x 3 bil;  Piriformis x 3 bil;  LTR x 15;   hip ER rom x 10;   Neuromuscular Re-education: Manual Therapy: Therapeutic Activity: Self Care:   PATIENT EDUCATION:  Education details: updated and reviewed HEP Person educated: Patient Education method: Explanation, Demonstration, and Handouts Education comprehension: verbalized understanding, returned demonstration, and needs further education  HOME EXERCISE PROGRAM: Access Code: [email or web address]   ASSESSMENT:  CLINICAL IMPRESSION: Pt with good tolerance for exercise  today. No other symptoms, headache resolved by later in session. Pt states some nervousness about driving today. He has no increased pain in hips/glutes with standing activities today. Pt to benefit from progression of standing strength and stairs as able in future visits.   Eval: Patient is an 83 y.o. M who was seen today for physical therapy evaluation and treatment for L>R hip and low back pain. PMH is significant for Parkinson's Disease and hypotension (pt reports history of systolic BP in the 80s some days). Assessment is significant for L>R glute med weakness, gross bilat glute max weakness, and some general lumbar tightness/hypomobility affecting his ability to tolerate prolonged standing, walking, transfers, and lifting tasks. Pt will benefit from PT to maximize his level of function.   OBJECTIVE IMPAIRMENTS: decreased activity tolerance, decreased endurance, decreased mobility, difficulty walking, decreased strength, increased fascial restrictions, increased muscle spasms, impaired flexibility, improper body mechanics, postural dysfunction, and pain.   ACTIVITY LIMITATIONS: carrying, lifting, standing, squatting, stairs, transfers, and locomotion level  PARTICIPATION LIMITATIONS: meal prep, cleaning, driving, shopping, community activity, and yard work  PERSONAL FACTORS: Age, Fitness, Past/current experiences, and Time since onset of injury/illness/exacerbation are also affecting patient's functional outcome.   REHAB POTENTIAL: Good  CLINICAL DECISION MAKING: Evolving/moderate complexity  EVALUATION COMPLEXITY: Moderate   GOALS: Goals reviewed with patient? Yes  SHORT TERM GOALS: Target date: 02/07/2024  Pt will be ind with initial HEP Baseline: Goal status: MET  2.  Pt will demo improved lumbar ROM by at least 10% Baseline:  Goal status: INITIAL  3.  Pt will report improved pain by >/=25% Baseline:  Goal status: MET   LONG TERM GOALS: Target date: 03/06/2024   Pt will  be ind with management and progression of HEP Baseline:  Goal status: INITIAL  2.  Pt will have improved LEFS score to >/=70% to demo MCID Baseline:  Goal status: INITIAL  3.  Pt will report improved pain by >/=50% Baseline:  Goal status: INITIAL  4.  Pt will demo at least 4/5 bilat LE strength for improved standing tolerance Baseline:  Goal status: INITIAL    PLAN:  PT FREQUENCY: 2x/week  PT DURATION: 8 weeks  PLANNED INTERVENTIONS: 97164- PT Re-evaluation, 97110-Therapeutic exercises, 97530- Therapeutic activity, [id number]- Neuromuscular re-education, 97535- Self Care, [id number]- Manual therapy, [id number]- Gait training, [phone]- Electrical stimulation (unattended), [phone]-  Ionotophoresis 4mg /ml Dexamethasone, 20560 (1-2 muscles), 20561 (3+ muscles)- Dry Needling, Patient/Family education, Balance training, Stair training, Taping, Joint mobilization, Spinal mobilization, Cryotherapy, and Moist heat  PLAN FOR NEXT SESSION:  Strengthen glute med/hips. Work on standing stability, stairs    Tinnie Don, PT, DPT 02/14/2024, 11:09 AM

## 2024-02-25 ENCOUNTER — Ambulatory Visit: Admitting: Physical Therapy

## 2024-02-25 ENCOUNTER — Encounter: Payer: Self-pay | Admitting: Physical Therapy

## 2024-02-25 DIAGNOSIS — R2689 Other abnormalities of gait and mobility: Secondary | ICD-10-CM | POA: Diagnosis not present

## 2024-02-25 DIAGNOSIS — M25552 Pain in left hip: Secondary | ICD-10-CM

## 2024-02-25 DIAGNOSIS — M6281 Muscle weakness (generalized): Secondary | ICD-10-CM | POA: Diagnosis not present

## 2024-02-25 NOTE — Therapy (Signed)
 OUTPATIENT PHYSICAL THERAPY LOWER EXTREMITY TREATMENT   Patient Name: Caleb Gibson MRN: [id number] DOB:[birth year], 83 y.o., male Today's Date: 02/25/2024  END OF SESSION:  PT End of Session - 02/25/24 1040     Visit Number 4    Number of Visits 16    Date for Recertification  03/06/24    Authorization Type [employer]  8/4 to 11/12 (12 visits)    Progress Note Due on Visit 10    PT Start Time 1020    PT Stop Time 1100    PT Time Calculation (min) 40 min    Equipment Utilized During Treatment Gait belt    Activity Tolerance Patient tolerated treatment well    Behavior During Therapy WFL for tasks assessed/performed            Past Medical History:  Diagnosis Date   Allergic rhinitis 08/27/2011   Basal cell carcinoma of skin    BPH (benign prostatic hyperplasia) 10/01/2011   Had urology work up; psa normal   Claustrophobia    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 Onita [facility]   MMSE 25/30 08/2020, neurology. On nemenda and aricept .  MMSE 19/30 04/2023, on nemenda and aricept      Erectile dysfunction 10/01/2011   GAD (generalized anxiety disorder) 08/06/2021   History of prostate cancer 11/22/2017   dxd 11/2017: active surveillance then rads tx 2020   History of traumatic head injury    Idiopathic hypotension 01/07/2016    Normal stress Echo - 12/2014 Neg adrenal insufficiency work up by endocrine 2017  Formatting of this note might be different from the original. Normal stress Echo - 12/2014   Mixed hyperlipidemia 09/27/2014   Night terrors    Osteoarthritis, knee 08/01/2010   Parkinson's disease, akinetic/rigid subtype 02/03/2022   REM behavioral disorder    Simple cyst of kidney 11/12/2011   Overview:  Stable on CT scan/ no further imaging or work up needed/cla  Formatting of this note might be different from the original. Stable on CT scan/ no further imaging or work up needed/cla    Tubular adenoma of colon 06/15/2017   Colonoscopy 08/2016; rec repeat in 5 years.    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   Parkinson's disease, akinetic/rigid subtype 02/03/2022   GAD (generalized anxiety disorder) 08/06/2021   REM behavioral disorder    History of traumatic head injury    Basal cell carcinoma of skin 06/15/2017   Tubular adenoma of colon 06/15/2017   Idiopathic hypotension 01/07/2016   Mixed hyperlipidemia 09/27/2014   Simple cyst of kidney 11/12/2011   BPH (benign prostatic hyperplasia) 10/01/2011   Erectile dysfunction 10/01/2011   Allergic rhinitis 08/27/2011   Osteoarthritis, knee 08/01/2010   Claustrophobia 07/08/2009    PCP: Jodie Lavern CROME, MD  REFERRING PROVIDER: Jodie Lavern CROME, MD  REFERRING DIAG: M54.16 (ICD-10-CM) - Left lumbar radiculopathy M25.552 (ICD-10-CM) - Left hip pain  THERAPY DIAG:  Pain in left hip  Muscle weakness (generalized)  Other abnormalities of gait and mobility  Rationale for Evaluation and  Treatment: Rehabilitation  ONSET DATE: ~6 months  SUBJECTIVE:   SUBJECTIVE STATEMENT: Pt states hip pain improving. Minimal pain this week.    Eval: Pt reports he has Parkinson's and because of this he has had greater difficulty with conversations and exercise. Wife is present to help with subjective. Pt reports L>R posterior hip pain. Wife notes times that pt has limped. If pt sits for a while, he has difficulty getting out of the chair. Pt does use the treadmill and tries to walk daily. Does have a [employer] membership. Did get trained on using the machine weights there but hasn't used it. Pt did get anti-inflammatory initially and it did  help his hip pain as well as exercises from Dr. Jodie but didn't keep up with it.   PERTINENT HISTORY: Parkinson's diagnosis for ~3 years  PAIN:  Are you having pain? Yes: NPRS scale: 0 currently,  Pain location: Post hip L>R (points towards glute med) Pain description: aching; can get grabbing/shooting pain sometimes Aggravating factors: Getting out of a chair, walking close to a mile, tender to the touch Relieving factors: Occasionally aleve  PRECAUTIONS: None  RED FLAGS: None   WEIGHT BEARING RESTRICTIONS: No  FALLS:  Has patient fallen in last 6 months? No  LIVING ENVIRONMENT: Lives with: lives with their spouse Lives in: House/apartment Stairs: 16 stairs to go upstairs Has following equipment at home: Single point cane  OCCUPATION: Retired  PLOF: Independent  PATIENT GOALS: Improve hip pain  NEXT MD VISIT: as needed  OBJECTIVE:  Note: Objective measures were completed at Evaluation unless otherwise noted.  DIAGNOSTIC FINDINGS: Hip and Lumbar X-rays from 12/20/23 IMPRESSION: Mild degenerative changes of the bilateral hips.  IMPRESSION: Mild degenerative changes of the lumbar spine.  PATIENT SURVEYS:  Lower Extremity Functional Score: 48 / 80 = 60.0 %  COGNITION: Overall cognitive status: Within functional limits for tasks assessed     SENSATION: WFL  EDEMA:    MUSCLE LENGTH: Hamstrings: Right 70 deg; Left 70 deg Thomas test: Flat on bed bilat  POSTURE: rounded shoulders, forward head, and increased thoracic kyphosis  PALPATION: TTP L>R glute med, piriformis  LUMBAR ROM:   Active  A/PROM  eval  Flexion 75%  Extension 50%  Right lateral flexion 75%  Left lateral flexion 75%  Right rotation 100%  Left rotation 100%   (Blank rows = not tested)   LOWER EXTREMITY ROM: WNL    LOWER EXTREMITY MMT:  MMT Right eval Left eval  Hip flexion 4 4  Hip extension 3 3  Hip abduction 4- 3+  Hip adduction    Hip internal rotation 4- 4-  Hip  external rotation 4- 3+  Knee flexion 4 4  Knee extension 5 5  Ankle dorsiflexion    Ankle plantarflexion    Ankle inversion    Ankle eversion     (Blank rows = not tested)  LOWER EXTREMITY SPECIAL TESTS:  Hip special tests: Belvie (FABER) test: negative, Thomas test: negative, and Hip scouring test: negative  FUNCTIONAL TESTS:  5 times sit to stand: 12.24 sec slightly dizzy  GAIT: Distance walked: Into clinic Assistive device utilized: None Level of assistance: Complete Independence Comments: Normal reciprocal pattern  TREATMENT DATE:   02/25/24: Therapeutic Exercise: Aerobic: Supine:  Clams Blue TB x 20 ;   Supine March RTB x 15;  Seated:  sit to stand 2 x  5  (easy) Standing:  hip abd  x 10 bil ,  Stretches:  SKTC 30 sec x 3 bil;  seated  Piriformis x 3 bil;    LTR x 15;   hip ER rom x 10;   Neuromuscular Re-education: Manual Therapy: LAD bil hips, manual stretching for IR/ER Therapeutic Activity:   Self Care:   02/14/24 Therapeutic Exercise: Aerobic: Supine:  Clams Blue TB x 20 ;    Seated:  sit to stand 2 x  5;    Standing:  hip abd 2 x 10 , 2 x 10 bil;  Stretches:  SKTC 30 sec x 3 bil;  Piriformis x 3 bil;    LTR x 15;   hip ER rom x 10;   Neuromuscular Re-education: Manual Therapy: Therapeutic Activity:   Self Care:   02/09/24:  Therapeutic Exercise: Aerobic: Supine:  Clams Blue TB x 15;   glute sets - review for HEP Seated:  sit to stand 2 x 5   Standing:  Stretches:  SKTC 30 sec x 3 bil;  Piriformis x 3 bil;  LTR x 15;   hip ER rom x 10;   Neuromuscular Re-education: Manual Therapy: Therapeutic Activity: Self Care:   PATIENT EDUCATION:  Education details: updated and reviewed HEP Person educated: Patient Education method: Explanation, Demonstration, and Handouts Education comprehension: verbalized understanding, returned  demonstration, and needs further education  HOME EXERCISE PROGRAM: Access Code: [email or web address]   ASSESSMENT:  CLINICAL IMPRESSION: Pt with decreasing pain levels in hip. He requires max cueing for exercises and continuing repetitions. He has mild stiffness but no pain in hips with activities today. Plan to practice step ups and stairs next visit.   Eval: Patient is an 83 y.o. M who was seen today for physical therapy evaluation and treatment for L>R hip and low back pain. PMH is significant for Parkinson's Disease and hypotension (pt reports history of systolic BP in the 80s some days). Assessment is significant for L>R glute med weakness, gross bilat glute max weakness, and some general lumbar tightness/hypomobility affecting his ability to tolerate prolonged standing, walking, transfers, and lifting tasks. Pt will benefit from PT to maximize his level of function.   OBJECTIVE IMPAIRMENTS: decreased activity tolerance, decreased endurance, decreased mobility, difficulty walking, decreased strength, increased fascial restrictions, increased muscle spasms, impaired flexibility, improper body mechanics, postural dysfunction, and pain.   ACTIVITY LIMITATIONS: carrying, lifting, standing, squatting, stairs, transfers, and locomotion level  PARTICIPATION LIMITATIONS: meal prep, cleaning, driving, shopping, community activity, and yard work  PERSONAL FACTORS: Age, Fitness, Past/current experiences, and Time since onset of injury/illness/exacerbation are also affecting patient's functional outcome.   REHAB POTENTIAL: Good  CLINICAL DECISION MAKING: Evolving/moderate complexity  EVALUATION COMPLEXITY: Moderate   GOALS: Goals reviewed with patient? Yes  SHORT TERM GOALS: Target date: 02/07/2024  Pt will be ind with initial HEP Baseline: Goal status: MET  2.  Pt will demo improved lumbar ROM by at least 10% Baseline:  Goal status: INITIAL  3.  Pt will report improved pain by >/=25% Baseline:   Goal status: MET   LONG TERM GOALS: Target date: 03/06/2024   Pt will be ind with management and progression of HEP Baseline:  Goal status: in progress  2.  Pt will have improved LEFS score to >/=70% to demo MCID Baseline:  Goal status: in progress  3.  Pt will report improved pain by >/=50% Baseline:  Goal status:  in progress  4.  Pt will demo at least 4/5 bilat LE strength for improved standing tolerance Baseline:  Goal status: in progress    PLAN:  PT FREQUENCY: 2x/week  PT DURATION: 8 weeks  PLANNED INTERVENTIONS: 97164- PT Re-evaluation, 97110-Therapeutic exercises, 97530- Therapeutic activity, 97112- Neuromuscular re-education, 97535- Self Care, 02859- Manual therapy, [phone]- Gait training, [phone]- Electrical stimulation (unattended), [phone]- Ionotophoresis 4mg /ml Dexamethasone, 79439 (1-2 muscles), 20561 (3+ muscles)- Dry Needling, Patient/Family education, Balance training, Stair training, Taping, Joint mobilization, Spinal mobilization, Cryotherapy, and Moist heat  PLAN FOR NEXT SESSION:  Strengthen glute med/hips. Work on standing stability, stairs    Tinnie Don, PT, DPT 02/25/2024, 10:41 AM

## 2024-02-28 DIAGNOSIS — Z961 Presence of intraocular lens: Secondary | ICD-10-CM | POA: Diagnosis not present

## 2024-02-28 DIAGNOSIS — H353121 Nonexudative age-related macular degeneration, left eye, early dry stage: Secondary | ICD-10-CM | POA: Diagnosis not present

## 2024-02-28 DIAGNOSIS — H43393 Other vitreous opacities, bilateral: Secondary | ICD-10-CM | POA: Diagnosis not present

## 2024-02-28 DIAGNOSIS — H35361 Drusen (degenerative) of macula, right eye: Secondary | ICD-10-CM | POA: Diagnosis not present

## 2024-02-28 DIAGNOSIS — H31091 Other chorioretinal scars, right eye: Secondary | ICD-10-CM | POA: Diagnosis not present

## 2024-02-28 DIAGNOSIS — H43813 Vitreous degeneration, bilateral: Secondary | ICD-10-CM | POA: Diagnosis not present

## 2024-02-29 ENCOUNTER — Encounter

## 2024-03-01 ENCOUNTER — Encounter: Payer: Self-pay | Admitting: Physical Therapy

## 2024-03-01 ENCOUNTER — Ambulatory Visit: Admitting: Physical Therapy

## 2024-03-01 DIAGNOSIS — M25552 Pain in left hip: Secondary | ICD-10-CM

## 2024-03-01 DIAGNOSIS — M6281 Muscle weakness (generalized): Secondary | ICD-10-CM | POA: Diagnosis not present

## 2024-03-01 NOTE — Therapy (Signed)
 OUTPATIENT PHYSICAL THERAPY LOWER EXTREMITY TREATMENT   Patient Name: Caleb Gibson MRN: [id number] DOB:[birth year], 83 y.o., male Today's Date: 03/01/2024  END OF SESSION:  PT End of Session - 03/01/24 1308     Visit Number 5    Number of Visits 16    Date for Recertification  03/06/24    Authorization Type [employer]  8/4 to 11/12 (12 visits)    Progress Note Due on Visit 10    PT Start Time 1307    PT Stop Time 1345    PT Time Calculation (min) 38 min    Equipment Utilized During Treatment Gait belt    Activity Tolerance Patient tolerated treatment well    Behavior During Therapy WFL for tasks assessed/performed            Past Medical History:  Diagnosis Date   Allergic rhinitis 08/27/2011   Basal cell carcinoma of skin    BPH (benign prostatic hyperplasia) 10/01/2011   Had urology work up; psa normal   Claustrophobia    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 Onita Neurology   MMSE 25/30 08/2020, neurology. On nemenda and aricept .  MMSE 19/30 04/2023, on nemenda and aricept      Erectile dysfunction 10/01/2011   GAD (generalized anxiety disorder) 08/06/2021   History of prostate cancer 11/22/2017   dxd 11/2017: active surveillance then rads tx 2020   History of traumatic head injury    Idiopathic hypotension 01/07/2016    Normal stress Echo - 12/2014 Neg adrenal insufficiency work up by endocrine 2017  Formatting of this note might be different from the original. Normal stress Echo - 12/2014   Mixed hyperlipidemia 09/27/2014   Night terrors    Osteoarthritis, knee 08/01/2010   Parkinson's disease, akinetic/rigid subtype 02/03/2022   REM behavioral disorder    Simple cyst of kidney 11/12/2011   Overview:  Stable on CT scan/ no further imaging or work up needed/cla  Formatting of this note might be different from the original. Stable on CT scan/ no further imaging or work up needed/cla    Tubular adenoma of colon 06/15/2017   Colonoscopy 08/2016; rec repeat in 5 years.    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   Parkinson's disease, akinetic/rigid subtype 02/03/2022   GAD (generalized anxiety disorder) 08/06/2021   REM behavioral disorder    History of traumatic head injury    Basal cell carcinoma of skin 06/15/2017   Tubular adenoma of colon 06/15/2017   Idiopathic hypotension 01/07/2016   Mixed hyperlipidemia 09/27/2014   Simple cyst of kidney 11/12/2011   BPH (benign prostatic hyperplasia) 10/01/2011   Erectile dysfunction 10/01/2011   Allergic rhinitis 08/27/2011   Osteoarthritis, knee 08/01/2010   Claustrophobia 07/08/2009    PCP: Jodie Lavern CROME, MD  REFERRING PROVIDER: Jodie Lavern CROME, MD  REFERRING DIAG: M54.16 (ICD-10-CM) - Left lumbar radiculopathy M25.552 (ICD-10-CM) - Left hip pain  THERAPY DIAG:  Pain in left hip  Muscle weakness (generalized)  Rationale for Evaluation and Treatment: Rehabilitation  ONSET DATE: ~6 months  SUBJECTIVE:   SUBJECTIVE STATEMENT: Pt states hip pain improving. Minimal pain this week.    Eval: Pt reports he has Parkinson's and because of this he has had greater difficulty with conversations and exercise. Wife is present to help with subjective. Pt reports L>R posterior hip pain. Wife notes times that pt has limped. If pt sits for a while, he has difficulty getting out of the chair. Pt does use the treadmill and tries to walk daily. Does have a [employer]. Did get trained on using the machine weights there but hasn't used it. Pt did get anti-inflammatory initially and it did help his hip pain as well as exercises from  Dr. Jodie but didn't keep up with it.   PERTINENT HISTORY: Parkinson's diagnosis for ~3 years  PAIN:  Are you having pain? Yes: NPRS scale: 0 currently,  Pain location: Post hip L>R (points towards glute med) Pain description: aching; can get grabbing/shooting pain sometimes Aggravating factors: Getting out of a chair, walking close to a mile, tender to the touch Relieving factors: Occasionally aleve  PRECAUTIONS: None  RED FLAGS: None   WEIGHT BEARING RESTRICTIONS: No  FALLS:  Has patient fallen in last 6 months? No  LIVING ENVIRONMENT: Lives with: lives with their spouse Lives in: House/apartment Stairs: 16 stairs to go upstairs Has following equipment at home: Single point cane  OCCUPATION: Retired  PLOF: Independent  PATIENT GOALS: Improve hip pain  NEXT MD VISIT: as needed  OBJECTIVE:  Note: Objective measures were completed at Evaluation unless otherwise noted.  DIAGNOSTIC FINDINGS: Hip and Lumbar X-rays from 12/20/23 IMPRESSION: Mild degenerative changes of the bilateral hips.  IMPRESSION: Mild degenerative changes of the lumbar spine.  PATIENT SURVEYS:  Lower Extremity Functional Score: 48 / 80 = 60.0 %  COGNITION: Overall cognitive status: Within functional limits for tasks assessed     SENSATION: WFL  EDEMA:    MUSCLE LENGTH: Hamstrings: Right 70 deg; Left 70 deg Thomas test: Flat on bed bilat  POSTURE: rounded shoulders, forward head, and increased thoracic kyphosis  PALPATION: TTP L>R glute med, piriformis  LUMBAR ROM:   Active  A/PROM  eval  Flexion 75%  Extension 50%  Right lateral flexion 75%  Left lateral flexion 75%  Right rotation 100%  Left rotation 100%   (Blank rows = not tested)   LOWER EXTREMITY ROM: WNL    LOWER EXTREMITY MMT:  MMT Right eval Left eval Right  9/24 Left 9/24  Hip flexion 4 4 4+ 4+  Hip extension 3 3    Hip abduction 4- 3+ 4 4  Hip adduction      Hip internal rotation 4- 4-    Hip  external rotation 4- 3+ 4+ 4+  Knee flexion 4 4 5 5   Knee extension 5 5 5 5   Ankle dorsiflexion      Ankle plantarflexion      Ankle inversion      Ankle eversion       (Blank rows = not tested)  LOWER EXTREMITY SPECIAL TESTS:  Hip special tests: Belvie (FABER) test: negative, Thomas test: negative, and Hip scouring test: negative  FUNCTIONAL TESTS:  5 times sit to stand: 12.24 sec slightly dizzy  GAIT: Distance walked: Into clinic Assistive device utilized: None Level of assistance: Complete Independence Comments: Normal reciprocal pattern  TREATMENT DATE:   03/01/25: Therapeutic Exercise: Aerobic: Supine:   S/L; clams rtb x 15 bil;  Seated:  sit to stand 2 x  5  ;  LAQ x 10 bil, ankle pumps x 10 bil;, marching x 10, trunk rotation x 10 (all for loosening up after being seated for a while, prior to standing up)  Standing:  hip abd  2 x 10 bil ,  Stretches:  SKTC 30 sec x 3 bil;  seated  Piriformis x 3 bil;    LTR x 15;   hip ER rom x 10;   Neuromuscular Re-education: Manual Therapy: LAD bil hips, manual stretching for IR/ER Therapeutic Activity:   Self Care:   02/25/24: Therapeutic Exercise: Aerobic: Supine:  Clams Blue TB x 20 ;   Supine March RTB x 15;  Seated:  sit to stand 2 x  5  (easy) Standing:  hip abd  x 10 bil ,  Stretches:  SKTC 30 sec x 3 bil;  seated  Piriformis x 3 bil;    LTR x 15;   hip ER rom x 10;   Neuromuscular Re-education: Manual Therapy: LAD bil hips, manual stretching for IR/ER Therapeutic Activity:   Self Care:   02/14/24 Therapeutic Exercise: Aerobic: Supine:  Clams Blue TB x 20 ;    Seated:  sit to stand 2 x  5;    Standing:  hip abd 2 x 10 , 2 x 10 bil;  Stretches:  SKTC 30 sec x 3 bil;  Piriformis x 3 bil;    LTR x 15;   hip ER rom x 10;   Neuromuscular Re-education: Manual Therapy: Therapeutic Activity:    Self Care:   02/09/24:  Therapeutic Exercise: Aerobic: Supine:  Clams Blue TB x 15;   glute sets - review for HEP Seated:  sit to stand 2 x 5   Standing:  Stretches:  SKTC 30 sec x 3 bil;  Piriformis x 3 bil;  LTR x 15;   hip ER rom x 10;   Neuromuscular Re-education: Manual Therapy: Therapeutic Activity: Self Care:   PATIENT EDUCATION:  Education details: updated and reviewed HEP Person educated: Patient Education method: Explanation, Demonstration, and Handouts Education comprehension: verbalized understanding, returned demonstration, and needs further education  HOME EXERCISE PROGRAM: Access Code: [email or web address]   ASSESSMENT:  CLINICAL IMPRESSION: Pt has made good progress with hip pain. He has no reported pain in last couple weeks. He is doing well with HEP without assist or reminders from wife. He has met goals at this time and is ready for d/c to HEP. Pt in agreement with plan .   Eval: Patient is an 83 y.o. M who was seen today for physical therapy evaluation and treatment for L>R hip and low back pain. PMH is significant for Parkinson's Disease and hypotension (pt reports history of systolic BP in the 80s some days). Assessment is significant for L>R glute med weakness, gross bilat glute max weakness, and some general lumbar tightness/hypomobility affecting his ability to tolerate prolonged standing, walking, transfers, and lifting tasks. Pt will benefit from PT to maximize his level of function.   OBJECTIVE IMPAIRMENTS: decreased activity tolerance, decreased endurance, decreased mobility, difficulty walking, decreased strength, increased fascial restrictions, increased muscle spasms, impaired flexibility, improper body mechanics, postural dysfunction, and pain.   ACTIVITY LIMITATIONS: carrying, lifting, standing, squatting, stairs, transfers, and locomotion level  PARTICIPATION LIMITATIONS: meal prep, cleaning, driving, shopping, community activity, and yard work  PERSONAL  FACTORS: Age, Fitness, Past/current experiences,  and Time since onset of injury/illness/exacerbation are also affecting patient's functional outcome.   REHAB POTENTIAL: Good  CLINICAL DECISION MAKING: Evolving/moderate complexity  EVALUATION COMPLEXITY: Moderate   GOALS: Goals reviewed with patient? Yes  SHORT TERM GOALS: Target date: 02/07/2024  Pt will be ind with initial HEP Baseline: Goal status: MET  2.  Pt will demo improved lumbar ROM by at least 10% Baseline:  Goal status: MET  3.  Pt will report improved pain by >/=25% Baseline:  Goal status: MET   LONG TERM GOALS: Target date: 03/06/2024   Pt will be ind with management and progression of HEP Baseline:  Goal status: MET  2.  Pt will have improved LEFS score to >/=70% to demo MCID Baseline:  Goal status: not tested   3.  Pt will report improved pain by >/=50% Baseline:  Goal status: MET  4.  Pt will demo at least 4/5 bilat LE strength for improved standing tolerance Baseline:  Goal status: MET    PLAN:  PT FREQUENCY: 2x/week  PT DURATION: 8 weeks  PLANNED INTERVENTIONS: 97164- PT Re-evaluation, 97110-Therapeutic exercises, 97530- Therapeutic activity, 97112- Neuromuscular re-education, 97535- Self Care, [id number]- Manual therapy, [phone]- Gait training, [phone]- Electrical stimulation (unattended), [phone]- Ionotophoresis 4mg /ml Dexamethasone, 79439 (1-2 muscles), 20561 (3+ muscles)- Dry Needling, Patient/Family education, Balance training, Stair training, Taping, Joint mobilization, Spinal mobilization, Cryotherapy, and Moist heat  PLAN FOR NEXT SESSION:     Tinnie Don, PT, DPT 03/01/2024, 1:09 PM  PHYSICAL THERAPY DISCHARGE SUMMARY  Visits from Start of Care: 5  Current functional level related to goals / functional outcomes:  Plan: Patient agrees to discharge.  Patient goals were met. Patient is being discharged due to meeting the stated rehab goals.     Tinnie Don, PT, DPT 1:51 PM   03/01/24

## 2024-03-02 DIAGNOSIS — F411 Generalized anxiety disorder: Secondary | ICD-10-CM | POA: Diagnosis not present

## 2024-03-02 DIAGNOSIS — F3341 Major depressive disorder, recurrent, in partial remission: Secondary | ICD-10-CM | POA: Diagnosis not present

## 2024-03-06 ENCOUNTER — Encounter: Admitting: Physical Therapy

## 2024-03-07 ENCOUNTER — Ambulatory Visit

## 2024-03-14 ENCOUNTER — Encounter

## 2024-03-27 ENCOUNTER — Ambulatory Visit: Payer: Self-pay | Admitting: *Deleted

## 2024-03-27 NOTE — Telephone Encounter (Signed)
 FYI Only or Action Required?: FYI only for provider.  Patient was last seen in primary care on 12/20/2023 by Jodie Lavern CROME, MD.  Called Nurse Triage reporting No chief complaint on file..  Symptoms began a week ago.  Interventions attempted: OTC medications: Aleve.  Symptoms are: unchanged.  Triage Disposition: See PCP When Office is Open (Within 3 Days)  Patient/caregiver understands and will follow disposition?: yes  Reason for Disposition  [1] Pain radiates into the thigh or further down the leg AND [2] one leg  Answer Assessment - Initial Assessment Questions Offered earlier appointment- but patient has other appointment obligations- scheduled as requested.   1. ONSET: When did the pain begin? (e.g., minutes, hours, days)     1 week 2. LOCATION: Where does it hurt? (upper, mid or lower back)     Lower back- causing limping 3. SEVERITY: How bad is the pain?  (e.g., Scale 1-10; mild, moderate, or severe)     5/10 4. PATTERN: Is the pain constant? (e.g., yes, no; constant, intermittent)      Comes and goes- with movement 5. RADIATION: Does the pain shoot into your legs or somewhere else?     Into back of leg 6. CAUSE:  What do you think is causing the back pain?      Not sure 7. BACK OVERUSE:  Any recent lifting of heavy objects, strenuous work or exercise?     no 8. MEDICINES: What have you taken so far for the pain? (e.g., nothing, acetaminophen, NSAIDS)     Aleve 9. NEUROLOGIC SYMPTOMS: Do you have any weakness, numbness, or problems with bowel/bladder control?     no 10. OTHER SYMPTOMS: Do you have any other symptoms? (e.g., fever, abdomen pain, burning with urination, blood in urine)       no  Protocols used: Back Pain-A-AH Copied from CRM #[id number]. Topic: Clinical - Red Word Triage >> Mar 27, 2024  3:42 PM Drema MATSU wrote: Red Word that prompted transfer to Nurse Triage: Patient has pain in lower back and is limping properly.

## 2024-03-28 NOTE — Telephone Encounter (Signed)
 Appt tomorrow

## 2024-03-29 ENCOUNTER — Encounter: Payer: Self-pay | Admitting: Family Medicine

## 2024-03-29 ENCOUNTER — Ambulatory Visit (INDEPENDENT_AMBULATORY_CARE_PROVIDER_SITE_OTHER): Admitting: Family Medicine

## 2024-03-29 VITALS — BP 110/60 | HR 74 | Temp 97.7°F | Ht 69.0 in | Wt 218.6 lb

## 2024-03-29 DIAGNOSIS — F02A3 Dementia in other diseases classified elsewhere, mild, with mood disturbance: Secondary | ICD-10-CM

## 2024-03-29 DIAGNOSIS — M47816 Spondylosis without myelopathy or radiculopathy, lumbar region: Secondary | ICD-10-CM

## 2024-03-29 DIAGNOSIS — G20A1 Parkinson's disease without dyskinesia, without mention of fluctuations: Secondary | ICD-10-CM | POA: Diagnosis not present

## 2024-03-29 DIAGNOSIS — S39012A Strain of muscle, fascia and tendon of lower back, initial encounter: Secondary | ICD-10-CM

## 2024-03-29 MED ORDER — DICLOFENAC SODIUM 75 MG PO TBEC: 75.0000 mg | DELAYED_RELEASE_TABLET | Freq: Two times a day (BID) | ORAL | 0 refills | Status: AC

## 2024-03-29 MED ORDER — CYCLOBENZAPRINE HCL 5 MG PO TABS: 5.0000 mg | ORAL_TABLET | Freq: Every evening | ORAL | 0 refills | Status: AC | PRN

## 2024-03-29 NOTE — Progress Notes (Signed)
 Subjective  CC:  Chief Complaint  Patient presents with   Back Pain    Pt stated that he went to reach for something on the kitchen table and his back went out    HPI: Caleb Gibson is a 83 y.o. male who presents to the office today to address the problems listed above in the chief complaint. Discussed the use of AI scribe software for clinical note transcription with the patient, who gave verbal consent to proceed.  History of Present Illness Caleb Gibson is an 83 year old male with Parkinson's disease who presents with acute low back pain.  Low back pain - Acute onset of low back pain began two weeks ago after reaching across a table, resulting in sudden pain in the middle of the low back - Pain is persistent and exacerbated by movement, especially when getting up or reaching for objects - No radiation of pain to the buttocks or legs - No muscle spasms - No bowel or bladder dysfunction - Experiences tightness in the low back when reaching for objects - History of chronic back pain and arthritis, previously managed with physical therapy and medications  Response to analgesic therapy - Currently taking Aleve (naproxen) three times daily, which provides some relief but pain persists - Diclofenac , used in July, was more effective in managing symptoms - Performs stretching exercises at home, learned during physical therapy, and finds them beneficial  Motor function and parkinsonism - Diagnosed with Parkinson's disease - Movements are slower and less fluid, particularly when transitioning from sitting to standing - Remains active and engages in home projects, though finds them more difficult than in the past   Assessment  1. Lumbar strain, initial encounter   2. Spondylosis of lumbar region without myelopathy or radiculopathy   3. Mild dementia due to Parkinson's disease, with mood disturbance (HCC)   4. Parkinson's disease without dyskinesia, unspecified whether manifestations  fluctuate (HCC)      Plan  Assessment and Plan Assessment & Plan Acute on chronic low back pain with muscle spasm Acute exacerbation of chronic low back pain with muscle spasm after reaching across a table. Pain is localized to the mid-lumbar region without radiation to the legs or associated bowel or bladder dysfunction. Pain persists for weeks, limiting mobility. Chronic back pain and arthritis present. Physical therapy was beneficial but paused due to increased pain. - Prescribe diclofenac  for two weeks as an anti-inflammatory instead of naproxen. - Prescribe cyclobenzaprine (Flexeril) as a muscle relaxant for nighttime use. - Advise continuation of home stretching exercises. - Recommend application of moist heat to the low back to alleviate tightness. - Instruct to report if no improvement in one to two weeks.  Parkinson's disease Parkinson's disease contributes to stiffness and difficulty with fluid movement, particularly when transitioning from sitting to standing. Symptoms are exacerbated by acute low back pain, but no new Parkinson's symptoms reported.    Follow up: prn No orders of the defined types were placed in this encounter.  Meds ordered this encounter  Medications   diclofenac  (VOLTAREN ) 75 MG EC tablet    Sig: Take 1 tablet (75 mg total) by mouth 2 (two) times daily.    Dispense:  30 tablet    Refill:  0   cyclobenzaprine (FLEXERIL) 5 MG tablet    Sig: Take 1 tablet (5 mg total) by mouth at bedtime as needed for muscle spasms.    Dispense:  30 tablet    Refill:  0  I reviewed the patients updated PMH, FH, and SocHx.  Patient Active Problem List   Diagnosis Date Noted   Dementia due to Parkinson's disease 01/04/2024    Priority: High   Parkinson's disease, akinetic/rigid subtype 02/03/2022    Priority: High   GAD (generalized anxiety disorder) 08/06/2021    Priority: High   Tubular adenoma of colon 06/15/2017    Priority: High   Mixed hyperlipidemia  09/27/2014    Priority: High   History of traumatic head injury     Priority: Medium    Idiopathic hypotension 01/07/2016    Priority: Medium    BPH (benign prostatic hyperplasia) 10/01/2011    Priority: Medium    Erectile dysfunction 10/01/2011    Priority: Medium    Osteoarthritis, knee 08/01/2010    Priority: Medium    Claustrophobia 07/08/2009    Priority: Medium    Basal cell carcinoma of skin 06/15/2017    Priority: Low   Simple cyst of kidney 11/12/2011    Priority: Low   Allergic rhinitis 08/27/2011    Priority: Low   Spondylosis of lumbar region without myelopathy or radiculopathy 03/29/2024   REM behavioral disorder    Current Meds  Medication Sig   ALPRAZolam  (XANAX ) 0.5 MG tablet Take 1 tablet (0.5 mg total) by mouth daily as needed for anxiety.   carbidopa -levodopa  (SINEMET  IR) 25-100 MG tablet Take 1 tablet by mouth 3 (three) times daily. 7am/11am/4pm   carboxymethylcellulose (REFRESH PLUS) 0.5 % SOLN Place 1 drop into both eyes 2 (two) times daily as needed.   cholecalciferol (VITAMIN D) 1000 units tablet Take 1,000 Units by mouth daily.    cyclobenzaprine (FLEXERIL) 5 MG tablet Take 1 tablet (5 mg total) by mouth at bedtime as needed for muscle spasms.   donepezil  (ARICEPT ) 10 MG tablet Take 1 tablet (10 mg total) by mouth daily.   Fluticasone Propionate (FLONASE NA) Place into the nose.   memantine  (NAMENDA ) 10 MG tablet Take 1 tablet (10 mg total) by mouth 2 (two) times daily.   Multiple Vitamins-Minerals (CENTRUM SILVER) tablet Take 1 tablet by mouth daily.    sertraline (ZOLOFT) 50 MG tablet Take 50 mg by mouth daily.   tamsulosin (FLOMAX) 0.4 MG CAPS capsule Take 0.4 mg by mouth.   [DISCONTINUED] diclofenac  (VOLTAREN ) 75 MG EC tablet Take 1 tablet (75 mg total) by mouth 2 (two) times daily.   Allergies: Patient is allergic to viagra  [sildenafil  citrate]. Family History: Patient family history includes Alcohol abuse in his maternal aunt and mother; Cancer  in his maternal grandfather and maternal grandmother; Cirrhosis in his maternal aunt; Diabetes in his father; Glaucoma in his father; Heart disease in his maternal grandmother; Lymphoma in his mother; Melanoma in his mother; Mental illness in his mother; Prostate cancer in his father. Social History:  Patient  reports that he quit smoking about 29 years ago. His smoking use included cigarettes. He has never used smokeless tobacco. He reports that he does not currently use alcohol. He reports that he does not use drugs.  Review of Systems: Constitutional: Negative for fever malaise or anorexia Cardiovascular: negative for chest pain Respiratory: negative for SOB or persistent cough Gastrointestinal: negative for abdominal pain  Objective  Vitals: BP 110/60   Pulse 74   Temp 97.7 F (36.5 C)   Ht 5' 9 (1.753 m)   Wt 218 lb 9.6 oz (99.2 kg)   SpO2 96%   BMI 32.28 kg/m  General: no acute distress , A&Ox3 No distress,  gets up from a seated position slowly.  Mildly antalgic gait. Back: Nontender.  No SI joint tenderness, normal lower extremity strength  Commons side effects, risks, benefits, and alternatives for medications and treatment plan prescribed today were discussed, and the patient expressed understanding of the given instructions. Patient is instructed to call or message via MyChart if he/she has any questions or concerns regarding our treatment plan. No barriers to understanding were identified. We discussed Red Flag symptoms and signs in detail. Patient expressed understanding regarding what to do in case of urgent or emergency type symptoms.  Medication list was reconciled, printed and provided to the patient in AVS. Patient instructions and summary information was reviewed with the patient as documented in the AVS. This note was prepared with assistance of Dragon voice recognition software. Occasional wrong-word or sound-a-like substitutions may have occurred due to the inherent  limitations of voice recognition software

## 2024-03-30 DIAGNOSIS — H353122 Nonexudative age-related macular degeneration, left eye, intermediate dry stage: Secondary | ICD-10-CM | POA: Diagnosis not present

## 2024-04-06 ENCOUNTER — Telehealth: Payer: Self-pay | Admitting: Neurology

## 2024-04-06 DIAGNOSIS — F3341 Major depressive disorder, recurrent, in partial remission: Secondary | ICD-10-CM | POA: Diagnosis not present

## 2024-04-06 DIAGNOSIS — F411 Generalized anxiety disorder: Secondary | ICD-10-CM | POA: Diagnosis not present

## 2024-04-06 NOTE — Telephone Encounter (Addendum)
 Pt last saw SS,NP 11/17/23. Per last note:     Called wife at [phone]. LVM for her to call office.

## 2024-04-06 NOTE — Telephone Encounter (Signed)
 Pt's wife has returned call to RN

## 2024-04-06 NOTE — Telephone Encounter (Addendum)
 Called and spoke with wife. Asked if he feels dehydrated or constipated. Confirms he drinks plenty of fluids.  Was having pain in hip/leg and placed on diclofenac  sodium tablet delayed release 75mg , take 1 tablet twice daily. Instructed to take for 15 days. Started on this about a week ago.   She preferred to schedule sooner appt with NP. Scheduled for him to see SS,NP 04/13/24 at 12:45pm, check in 12:15pm.  She requested to keep appt still with Dr. Onita until he sees Lauraine and they can decide whether to cx or r/s to a later date at that time.

## 2024-04-06 NOTE — Telephone Encounter (Signed)
 Pt's wife called wanting to speak to the nurse regarding a melt down the pt had today. Pt was confused, stressed and had paranoia and she is wanting to discuss.

## 2024-04-06 NOTE — Telephone Encounter (Signed)
 Patient with slow worsening memory loss can have worsening symptoms like this  Make sure there is no medication changes, he is not dehydrated, not constipated, which are common triggers for worsening agitation  It is okay to see nurse practitioner if your can find an earlier appointment, otherwise it is okay to keep previous appointment with me

## 2024-04-06 NOTE — Telephone Encounter (Signed)
 Took call from Lisa/phone room. Spoke w/ wife. Similar episode has occurred in past but not for several months until recently.  He will gets ready in morning, come downstairs and start to question things. Feels wife is hiding things from him, that he is her enemy. (Example- accuses her that she gave wrong size fork to him). He is having hard time communicating. Stating he does not know what to do and more stressed. Saw nurse psychologist today who recommended he see neurologist to make sure nothing else going on to cause sx. Have appt scheduled 05/22/24 at 10:30am currently with Dr. Onita. I added to wait list.   Did complete ST for cognitive training for a couple months and then stopped. Did not feel it was very effective.   No signs of UTI currently. No other infection or illness recently.   Dr. Onita- do you want to work him in somewhere sooner?

## 2024-04-13 ENCOUNTER — Ambulatory Visit: Admitting: Neurology

## 2024-04-13 ENCOUNTER — Encounter: Payer: Self-pay | Admitting: Neurology

## 2024-04-13 VITALS — BP 96/58 | HR 76 | Ht 70.0 in | Wt 225.5 lb

## 2024-04-13 DIAGNOSIS — R4189 Other symptoms and signs involving cognitive functions and awareness: Secondary | ICD-10-CM | POA: Diagnosis not present

## 2024-04-13 DIAGNOSIS — F02A3 Dementia in other diseases classified elsewhere, mild, with mood disturbance: Secondary | ICD-10-CM

## 2024-04-13 DIAGNOSIS — G20A1 Parkinson's disease without dyskinesia, without mention of fluctuations: Secondary | ICD-10-CM | POA: Diagnosis not present
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
## 2024-04-13 NOTE — Patient Instructions (Signed)
 Check MRI brain  Check labs, urine  Continue other medications  Keep appointment in December with Dr. Onita

## 2024-04-14 ENCOUNTER — Encounter: Payer: Self-pay | Admitting: Neurology

## 2024-04-14 ENCOUNTER — Telehealth: Payer: Self-pay | Admitting: Neurology

## 2024-04-14 NOTE — Telephone Encounter (Signed)
 I called the patient's wife, labs overall unrevealing, mild stable anemia, UA shows trace leukocytes, 1+ ketones, microscopic evaluation was normal.  Culture is pending.  Seems more suggestive of dehydration.  Drink plenty of water, see what culture grows.

## 2024-04-14 NOTE — Telephone Encounter (Signed)
MRI order sent to [place] [phone]

## 2024-04-15 LAB — CBC WITH DIFFERENTIAL/PLATELET
Basophils Absolute: 0 x10E3/uL (ref 0.0–0.2)
Basos: 1 %
EOS (ABSOLUTE): 0.1 x10E3/uL (ref 0.0–0.4)
Eos: 2 %
Hematocrit: 36.3 % — ABNORMAL LOW (ref 37.5–51.0)
Hemoglobin: 11.8 g/dL — ABNORMAL LOW (ref 13.0–17.7)
Immature Grans (Abs): 0 x10E3/uL (ref 0.0–0.1)
Immature Granulocytes: 0 %
Lymphocytes Absolute: 0.9 x10E3/uL (ref 0.7–3.1)
Lymphs: 18 %
MCH: 31.2 pg (ref 26.6–33.0)
MCHC: 32.5 g/dL (ref 31.5–35.7)
MCV: 96 fL (ref 79–97)
Monocytes Absolute: 0.5 x10E3/uL (ref 0.1–0.9)
Monocytes: 9 %
Neutrophils Absolute: 3.5 x10E3/uL (ref 1.4–7.0)
Neutrophils: 70 %
Platelets: 223 x10E3/uL (ref 150–450)
RBC: 3.78 x10E6/uL — ABNORMAL LOW (ref 4.14–5.80)
RDW: 12.3 % (ref 11.6–15.4)
WBC: 5 x10E3/uL (ref 3.4–10.8)

## 2024-04-15 LAB — MICROSCOPIC EXAMINATION
Bacteria, UA: NONE SEEN
Casts: NONE SEEN /LPF
RBC, Urine: NONE SEEN /HPF (ref 0–2)
WBC, UA: NONE SEEN /HPF (ref 0–5)

## 2024-04-15 LAB — COMPREHENSIVE METABOLIC PANEL WITH GFR
ALT: 13 IU/L (ref 0–44)
AST: 17 IU/L (ref 0–40)
Albumin: 4.1 g/dL (ref 3.7–4.7)
Alkaline Phosphatase: 83 IU/L (ref 48–129)
BUN/Creatinine Ratio: 18 (ref 10–24)
BUN: 21 mg/dL (ref 8–27)
Bilirubin Total: 0.3 mg/dL (ref 0.0–1.2)
CO2: 24 mmol/L (ref 20–29)
Calcium: 9 mg/dL (ref 8.6–10.2)
Chloride: 105 mmol/L (ref 96–106)
Creatinine, Ser: 1.15 mg/dL (ref 0.76–1.27)
Globulin, Total: 1.7 g/dL (ref 1.5–4.5)
Glucose: 89 mg/dL (ref 70–99)
Potassium: 4.5 mmol/L (ref 3.5–5.2)
Sodium: 140 mmol/L (ref 134–144)
Total Protein: 5.8 g/dL — ABNORMAL LOW (ref 6.0–8.5)
eGFR: 63 mL/min/1.73 (ref >59)

## 2024-04-15 LAB — UA/M W/RFLX CULTURE, ROUTINE
Bilirubin, UA: NEGATIVE
Glucose, UA: NEGATIVE
Nitrite, UA: NEGATIVE
RBC, UA: NEGATIVE
Specific Gravity, UA: >=1.03 — AB (ref 1.005–1.030)
Urobilinogen, Ur: 1 mg/dL (ref 0.2–1.0)
pH, UA: 5.5 (ref 5.0–7.5)

## 2024-04-15 LAB — URINE CULTURE, REFLEX: Organism ID, Bacteria: NO GROWTH

## 2024-04-17 ENCOUNTER — Ambulatory Visit
Admission: RE | Admit: 2024-04-17 | Discharge: 2024-04-17 | Disposition: A | Source: Ambulatory Visit | Attending: Neurology | Admitting: Neurology

## 2024-04-17 ENCOUNTER — Other Ambulatory Visit: Payer: Self-pay | Admitting: Neurology

## 2024-04-17 DIAGNOSIS — F02A3 Dementia in other diseases classified elsewhere, mild, with mood disturbance: Secondary | ICD-10-CM

## 2024-04-17 DIAGNOSIS — R4189 Other symptoms and signs involving cognitive functions and awareness: Secondary | ICD-10-CM | POA: Diagnosis not present

## 2024-04-17 DIAGNOSIS — G20A1 Parkinson's disease without dyskinesia, without mention of fluctuations: Secondary | ICD-10-CM | POA: Diagnosis not present

## 2024-04-17 NOTE — Progress Notes (Signed)
 Chart reviewed, agree above plan ?

## 2024-04-18 ENCOUNTER — Telehealth: Payer: Self-pay | Admitting: Neurology

## 2024-04-18 NOTE — Telephone Encounter (Signed)
 I called his wife. Urine culture did not grow any bacteria.

## 2024-04-20 ENCOUNTER — Ambulatory Visit: Payer: Self-pay | Admitting: Neurology

## 2024-04-20 NOTE — Telephone Encounter (Signed)
 Last seen on 04/13/24 Follow up scheduled 05/22/24

## 2024-04-24 ENCOUNTER — Other Ambulatory Visit: Payer: Self-pay | Admitting: Neurology

## 2024-04-25 NOTE — Telephone Encounter (Signed)
-----   Message from Lauraine JINNY Born sent at 04/20/2024  5:25 AM EST ----- Please call, MRI of the brain is overall stable, no significant change from July 2022. No acute stroke was noted. Thanks ----- Message ----- From: Margaret Eduard SAUNDERS, MD Sent: 04/19/2024   7:52 PM EST To: Lauraine JINNY Born, NP

## 2024-04-25 NOTE — Telephone Encounter (Signed)
 Call to patient to review results, no answer. Left message to return call. 1st att.

## 2024-04-25 NOTE — Telephone Encounter (Signed)
 Wife has returned call to RN

## 2024-04-25 NOTE — Telephone Encounter (Signed)
 Wife returned call and verbalized understanding of results

## 2024-05-01 DIAGNOSIS — R3915 Urgency of urination: Secondary | ICD-10-CM | POA: Diagnosis not present

## 2024-05-01 DIAGNOSIS — N401 Enlarged prostate with lower urinary tract symptoms: Secondary | ICD-10-CM | POA: Diagnosis not present

## 2024-05-01 DIAGNOSIS — C61 Malignant neoplasm of prostate: Secondary | ICD-10-CM | POA: Diagnosis not present

## 2024-05-11 DIAGNOSIS — F411 Generalized anxiety disorder: Secondary | ICD-10-CM | POA: Diagnosis not present

## 2024-05-11 DIAGNOSIS — F3341 Major depressive disorder, recurrent, in partial remission: Secondary | ICD-10-CM | POA: Diagnosis not present

## 2024-05-22 ENCOUNTER — Encounter: Payer: Self-pay | Admitting: Neurology

## 2024-05-22 ENCOUNTER — Ambulatory Visit: Admitting: Neurology

## 2024-05-22 VITALS — BP 108/64 | HR 69 | Ht 70.0 in | Wt 225.6 lb

## 2024-05-22 DIAGNOSIS — F02A3 Dementia in other diseases classified elsewhere, mild, with mood disturbance: Secondary | ICD-10-CM | POA: Diagnosis not present

## 2024-05-22 DIAGNOSIS — G20C Parkinsonism, unspecified: Secondary | ICD-10-CM

## 2024-05-22 DIAGNOSIS — G20A1 Parkinson's disease without dyskinesia, without mention of fluctuations: Secondary | ICD-10-CM | POA: Diagnosis not present

## 2024-05-22 MED ORDER — CARBIDOPA-LEVODOPA 25-100 MG PO TABS: 1.0000 | ORAL_TABLET | Freq: Three times a day (TID) | ORAL | 3 refills | Status: DC

## 2024-05-22 MED ORDER — CARBIDOPA-LEVODOPA 25-100 MG PO TABS: 1.0000 | ORAL_TABLET | Freq: Three times a day (TID) | ORAL | 3 refills | Status: AC
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
## 2024-08-28 ENCOUNTER — Encounter: Admitting: Family Medicine

## 2024-12-07 ENCOUNTER — Ambulatory Visit: Admitting: Neurology

## 2025-01-11 ENCOUNTER — Institutional Professional Consult (permissible substitution): Admitting: Psychology

## 2025-01-11 ENCOUNTER — Ambulatory Visit: Payer: Self-pay

## 2025-01-18 ENCOUNTER — Encounter: Admitting: Psychology
